# Patient Record
Sex: Female | Born: 1940 | Race: White | Hispanic: No | Smoking: Former smoker
Health system: Southern US, Community
[De-identification: ages and names within clinical notes are randomized; demographics above are authoritative.]

## PROBLEM LIST (undated history)

## (undated) DIAGNOSIS — M810 Age-related osteoporosis without current pathological fracture: Secondary | ICD-10-CM

## (undated) DIAGNOSIS — K5790 Diverticulosis of intestine, part unspecified, without perforation or abscess without bleeding: Secondary | ICD-10-CM

## (undated) DIAGNOSIS — K589 Irritable bowel syndrome without diarrhea: Secondary | ICD-10-CM

## (undated) DIAGNOSIS — I10 Essential (primary) hypertension: Secondary | ICD-10-CM

## (undated) DIAGNOSIS — H409 Unspecified glaucoma: Secondary | ICD-10-CM

## (undated) DIAGNOSIS — G43909 Migraine, unspecified, not intractable, without status migrainosus: Secondary | ICD-10-CM

## (undated) DIAGNOSIS — J449 Chronic obstructive pulmonary disease, unspecified: Secondary | ICD-10-CM

## (undated) DIAGNOSIS — M545 Low back pain, unspecified: Secondary | ICD-10-CM

## (undated) DIAGNOSIS — I700 Atherosclerosis of aorta: Secondary | ICD-10-CM

## (undated) DIAGNOSIS — C44301 Unspecified malignant neoplasm of skin of nose: Secondary | ICD-10-CM

## (undated) DIAGNOSIS — E7800 Pure hypercholesterolemia, unspecified: Secondary | ICD-10-CM

## (undated) DIAGNOSIS — N6019 Diffuse cystic mastopathy of unspecified breast: Secondary | ICD-10-CM

## (undated) DIAGNOSIS — R0600 Dyspnea, unspecified: Secondary | ICD-10-CM

## (undated) DIAGNOSIS — K219 Gastro-esophageal reflux disease without esophagitis: Secondary | ICD-10-CM

## (undated) DIAGNOSIS — N189 Chronic kidney disease, unspecified: Secondary | ICD-10-CM

## (undated) DIAGNOSIS — C801 Malignant (primary) neoplasm, unspecified: Secondary | ICD-10-CM

## (undated) DIAGNOSIS — E559 Vitamin D deficiency, unspecified: Secondary | ICD-10-CM

## (undated) DIAGNOSIS — R251 Tremor, unspecified: Secondary | ICD-10-CM

## (undated) DIAGNOSIS — L409 Psoriasis, unspecified: Secondary | ICD-10-CM

## (undated) DIAGNOSIS — R278 Other lack of coordination: Secondary | ICD-10-CM

## (undated) DIAGNOSIS — F419 Anxiety disorder, unspecified: Secondary | ICD-10-CM

## (undated) HISTORY — PX: TONSILLECTOMY: SUR1361

## (undated) HISTORY — PX: APPENDECTOMY: SHX54

## (undated) HISTORY — PX: EYE SURGERY: SHX253

## (undated) HISTORY — PX: CEREBRAL ANEURYSM REPAIR: SHX164

## (undated) HISTORY — PX: BREAST BIOPSY: SHX20

---

## 2003-08-19 HISTORY — PX: MOHS SURGERY: SUR867

## 2007-11-05 ENCOUNTER — Ambulatory Visit: Payer: Self-pay | Admitting: Internal Medicine

## 2008-02-10 ENCOUNTER — Ambulatory Visit: Payer: Self-pay | Admitting: Family Medicine

## 2008-02-16 ENCOUNTER — Ambulatory Visit: Payer: Self-pay | Admitting: Family Medicine

## 2010-01-09 ENCOUNTER — Ambulatory Visit: Payer: Self-pay | Admitting: Gastroenterology

## 2010-01-11 ENCOUNTER — Ambulatory Visit: Payer: Self-pay | Admitting: Gastroenterology

## 2010-04-09 ENCOUNTER — Ambulatory Visit: Payer: Self-pay | Admitting: Internal Medicine

## 2010-09-17 ENCOUNTER — Ambulatory Visit: Payer: Self-pay | Admitting: Ophthalmology

## 2010-10-03 ENCOUNTER — Ambulatory Visit: Payer: Self-pay | Admitting: Ophthalmology

## 2012-04-15 ENCOUNTER — Ambulatory Visit: Payer: Self-pay | Admitting: Internal Medicine

## 2012-05-13 ENCOUNTER — Inpatient Hospital Stay: Payer: Self-pay | Admitting: Internal Medicine

## 2012-05-13 LAB — CBC
MCHC: 33.8 g/dL (ref 32.0–36.0)
Platelet: 221 10*3/uL (ref 150–440)
RDW: 12.9 % (ref 11.5–14.5)
WBC: 6.9 10*3/uL (ref 3.6–11.0)

## 2012-05-13 LAB — CK TOTAL AND CKMB (NOT AT ARMC): CK, Total: 148 U/L (ref 21–215)

## 2012-05-13 LAB — BASIC METABOLIC PANEL
Osmolality: 272 (ref 275–301)
Potassium: 3.6 mmol/L (ref 3.5–5.1)
Sodium: 135 mmol/L — ABNORMAL LOW (ref 136–145)

## 2012-05-17 LAB — PLATELET COUNT: Platelet: 304 10*3/uL (ref 150–440)

## 2012-05-17 LAB — HEMOGLOBIN: HGB: 13.5 g/dL (ref 12.0–16.0)

## 2012-08-04 ENCOUNTER — Ambulatory Visit: Payer: Self-pay | Admitting: Gastroenterology

## 2012-11-04 ENCOUNTER — Ambulatory Visit: Payer: Self-pay | Admitting: Specialist

## 2012-11-04 LAB — CREATININE, SERUM
Creatinine: 1.04 mg/dL (ref 0.60–1.30)
EGFR (African American): >60
EGFR (Non-African Amer.): 54 — ABNORMAL LOW

## 2012-12-17 ENCOUNTER — Ambulatory Visit: Payer: Self-pay

## 2013-05-04 ENCOUNTER — Ambulatory Visit: Payer: Self-pay | Admitting: Specialist

## 2013-05-04 ENCOUNTER — Ambulatory Visit: Payer: Self-pay | Admitting: Otolaryngology

## 2013-05-04 LAB — CREATININE, SERUM
Creatinine: 0.96 mg/dL (ref 0.60–1.30)
EGFR (African American): >60
EGFR (Non-African Amer.): 60 — ABNORMAL LOW

## 2013-05-18 DEATH — deceased

## 2014-02-28 LAB — BASIC METABOLIC PANEL
ANION GAP: 9 (ref 7–16)
BUN: 14 mg/dL (ref 7–18)
CALCIUM: 9.3 mg/dL (ref 8.5–10.1)
Chloride: 101 mmol/L (ref 98–107)
Co2: 24 mmol/L (ref 21–32)
Creatinine: 0.89 mg/dL (ref 0.60–1.30)
EGFR (African American): >60
EGFR (Non-African Amer.): >60
Glucose: 126 mg/dL — ABNORMAL HIGH (ref 65–99)
Osmolality: 270 (ref 275–301)
Potassium: 3.5 mmol/L (ref 3.5–5.1)
SODIUM: 134 mmol/L — AB (ref 136–145)

## 2014-02-28 LAB — CBC
HCT: 40.1 % (ref 35.0–47.0)
HGB: 13.5 g/dL (ref 12.0–16.0)
MCH: 30.6 pg (ref 26.0–34.0)
MCHC: 33.7 g/dL (ref 32.0–36.0)
MCV: 91 fL (ref 80–100)
Platelet: 392 10*3/uL (ref 150–440)
RBC: 4.42 10*6/uL (ref 3.80–5.20)
RDW: 13.6 % (ref 11.5–14.5)
WBC: 9.4 10*3/uL (ref 3.6–11.0)

## 2014-02-28 LAB — TROPONIN I: Troponin-I: <0.02 ng/mL

## 2014-03-01 ENCOUNTER — Observation Stay: Payer: Self-pay | Admitting: Family Medicine

## 2014-03-02 LAB — CBC WITH DIFFERENTIAL/PLATELET
BASOS ABS: 0.1 10*3/uL (ref 0.0–0.1)
BASOS PCT: 1.2 %
EOS PCT: 3.7 %
Eosinophil #: 0.2 10*3/uL (ref 0.0–0.7)
HCT: 36 % (ref 35.0–47.0)
HGB: 12.1 g/dL (ref 12.0–16.0)
LYMPHS ABS: 2.3 10*3/uL (ref 1.0–3.6)
Lymphocyte %: 41.9 %
MCH: 30.9 pg (ref 26.0–34.0)
MCHC: 33.5 g/dL (ref 32.0–36.0)
MCV: 92 fL (ref 80–100)
MONOS PCT: 7.4 %
Monocyte #: 0.4 x10 3/mm (ref 0.2–0.9)
Neutrophil #: 2.5 10*3/uL (ref 1.4–6.5)
Neutrophil %: 45.8 %
Platelet: 290 10*3/uL (ref 150–440)
RBC: 3.9 10*6/uL (ref 3.80–5.20)
RDW: 13.8 % (ref 11.5–14.5)
WBC: 5.5 10*3/uL (ref 3.6–11.0)

## 2014-03-02 LAB — BASIC METABOLIC PANEL
ANION GAP: 4 — AB (ref 7–16)
BUN: 19 mg/dL — ABNORMAL HIGH (ref 7–18)
Calcium, Total: 8.2 mg/dL — ABNORMAL LOW (ref 8.5–10.1)
Chloride: 108 mmol/L — ABNORMAL HIGH (ref 98–107)
Co2: 27 mmol/L (ref 21–32)
Creatinine: 0.93 mg/dL (ref 0.60–1.30)
EGFR (African American): >60
EGFR (Non-African Amer.): >60
GLUCOSE: 89 mg/dL (ref 65–99)
OSMOLALITY: 279 (ref 275–301)
Potassium: 4.1 mmol/L (ref 3.5–5.1)
Sodium: 139 mmol/L (ref 136–145)

## 2014-03-24 ENCOUNTER — Ambulatory Visit: Payer: Self-pay | Admitting: Internal Medicine

## 2014-03-30 DIAGNOSIS — R519 Headache, unspecified: Secondary | ICD-10-CM | POA: Insufficient documentation

## 2014-03-30 DIAGNOSIS — R200 Anesthesia of skin: Secondary | ICD-10-CM | POA: Insufficient documentation

## 2014-03-30 DIAGNOSIS — R51 Headache: Secondary | ICD-10-CM

## 2014-04-27 ENCOUNTER — Ambulatory Visit: Payer: Self-pay | Admitting: Specialist

## 2014-06-06 DIAGNOSIS — I10 Essential (primary) hypertension: Secondary | ICD-10-CM | POA: Insufficient documentation

## 2014-09-08 DIAGNOSIS — K2100 Gastro-esophageal reflux disease with esophagitis, without bleeding: Secondary | ICD-10-CM | POA: Insufficient documentation

## 2014-09-08 DIAGNOSIS — J449 Chronic obstructive pulmonary disease, unspecified: Secondary | ICD-10-CM | POA: Insufficient documentation

## 2014-09-08 DIAGNOSIS — E559 Vitamin D deficiency, unspecified: Secondary | ICD-10-CM | POA: Insufficient documentation

## 2014-09-09 DIAGNOSIS — R7303 Prediabetes: Secondary | ICD-10-CM | POA: Insufficient documentation

## 2014-10-19 DIAGNOSIS — M545 Low back pain, unspecified: Secondary | ICD-10-CM | POA: Insufficient documentation

## 2015-03-03 ENCOUNTER — Other Ambulatory Visit: Payer: Self-pay | Admitting: Specialist

## 2015-03-03 DIAGNOSIS — R911 Solitary pulmonary nodule: Secondary | ICD-10-CM

## 2015-03-11 NOTE — Discharge Summary (Signed)
PATIENT NAME:  Jessica Bowman, Jessica Bowman MR#:  [id number] DATE OF BIRTH:  [birth year]  DATE OF ADMISSION:  03/01/2014 DATE OF DISCHARGE:  03/02/2014  REASON FOR ADMISSION: Severe headache with intractable pain.   DISPOSITION: Home.   FOLLOWUP: With Dr. Ezequiel Kayser, in 1 to 2 weeks.  REFERRAL: To neurology, Dr. Melrose Nakayama, in 2 to 4 weeks.   MEDICATIONS AT DISCHARGE: Avalide 150/12.5 mg half of a tablet once a day, Lexapro 5 mg twice daily, timolol 0.5% in affected eye, aspirin 81 mg daily, Symbicort 160/4.5 mcg inhaler 2 puffs twice daily, Xanax 0.25 mg 3 times a day as needed for anxiety, Combivent 18 mcg/103 mcg 4 times a day as needed for shortness of breath, Spiriva 18 mcg once a day, DuoNeb at home as needed for shortness of breath, and Percocet 5/325 mg 1 every 4 hours as needed for severe pain.  IMAGING: Important results: CT scan of the head: Age-related atrophy and chronic white matter ischemic changes. MRI with an MRA of the brain without contrast shows atrophy and small vessel disease. No acute intracranial findings. Due to susceptibility artifact, status of the previously clipped aneurysm is not assessed with MR. Bulbous appearing basilar tip. 2 mm broad-based saccular aneurysm not excluded. Recommend followup with neurology outpatient.   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 She had a history of migraines in the past but this is the worst she has had. She had some complaints of nausea and vomiting with sensitivity to light and at some point developed an episode of left hand numbness at the level of the lateral 3 fingers associated with weakness lasting for 15 to 20 minutes. Worse headache  so  she came to the Emergency Department. The patient was admitted and  closely followed. MRI/MRA of the brain was done without any acute findings.  As mentioned above, there is atrophy and small vessel disease. There is some susceptibility artifact due to the previous clipped aneurysm for what a bulbous appearing basilar tip measures 2 mm broad-based saccular aneurysm could not be excluded completely. Otherwise recommendation for followup with neurology neurosurgery recommended. The patient was discharged in really good condition. She had full resolution of the headaches without any significant problems.  As her neurologic symptoms resolved, her headache resolved and she was headache free during the hospitalization.   TIME SPENT: I spent about 45 minutes discharging this patient.    ____________________________ Kayenta Sink, MD rsg:lt D: 03/06/2014 03:40:00 ET T: 03/06/2014 04:59:59 ET JOB#: [id number]  cc: Thayer Sink, MD, <Dictator> Ravin Bendall America Brown MD ELECTRONICALLY SIGNED 03/14/2014 14:58

## 2015-03-11 NOTE — H&P (Signed)
PATIENT NAME:  Jessica Bowman, Jessica Bowman MR#:  [id number] DATE OF BIRTH:  [birth year]  DATE OF ADMISSION:  03/01/2014  REFERRING PHYSICIAN: Dr. Lisa Roca.   PRIMARY CARE PHYSICIAN: Dr. Ezequiel Kayser.    CHIEF COMPLAINT: Headache.   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 Presents with complaints of headache. The patient reports the headache is right-sided, started a few hours prior to presentation. Reports it represents her of migraines but reports she has not had these migraines for a few years now. As well, complains of some nausea and vomiting with it. As well, reports some sensitivity to light. As well, the patient reported at home she had a brief episode of left hand, mainly the lateral 3 fingers, numbness and weakness, lasted for around 15 minutes which currently resolved. CT head was done in the ED. Did not show any acute findings. Only showed evidence of previous right MCA territory aneurysm clipping. The patient's blood pressure was acceptable in the ED. The patient initially refused an LP to evaluate for subarachnoid hemorrhage. Her CT head did not show evidence of subarachnoid bleed. Hospitalists were requested to admit the patient for further evaluation and workup and possible need for MRI in the morning. The patient denies any fever or chills, any nausea currently. No further vomiting, nausea. No blurry vision. No visual problems. No current focal deficits or tingling or numbness. Reports her headache is much improved.   PAST MEDICAL HISTORY:  1. Hypertension.  2. COPD.  3. Anxiety.  4. Glaucoma.   PAST SURGICAL HISTORY:  1. Right ophthalmic artery aneurysm clipping.  2. Appendectomy.  3. Tonsillectomy.   ALLERGIES: PENICILLIN causing rash. LISINOPRIL causing angioedema.   HOME MEDICATIONS:  1. Lexapro 5 mg oral daily.  2. Breo Ellipta inhalational 1  twice a day.  3. DuoNebs as needed.  4. Spiriva inhalational daily.  5. Lumigan 0.01% ophthalmic solution both eyes at bedtime 1 drop.   SOCIAL HISTORY: The patient quit smoking before 2 years. No alcohol. No illicit drug use.   FAMILY HISTORY: Significant for heart disease and coronary artery disease in the family.   REVIEW OF SYSTEMS:  CONSTITUTIONAL: Denies fever, chills, fatigue, weight gain, weight loss.  EYES: Denies blurry vision, double vision, inflammation, glaucoma.  ENT: Denies tinnitus, ear pain, hearing loss, epistaxis or discharge.  RESPIRATORY: Denies cough, wheezing, shortness of breath, hemoptysis.  CARDIOVASCULAR: Denies chest pain, edema, arrhythmia, palpitation.  GASTROINTESTINAL: Reports nausea and vomiting, currently resolved. Denies any diarrhea, abdominal pain, hematemesis, melena.  GENITOURINARY: Denies dysuria, hematuria, renal colic.  ENDOCRINE: Denies polyuria, polydipsia, heat or cold intolerance.  HEMATOLOGY: Denies anemia, easy bruising, bleeding diathesis.  INTEGUMENTARY: Denies any acne, rash or skin lesion.  MUSCULOSKELETAL: Denies any swelling, gout, arthritis, cramps.  NEUROLOGIC: Denies any history of CVA, TIA, seizures in the past. Reports right-sided headache. Reports brief episode of tingling and numbness and focal weakness in the left hand, mainly in the lateral 3 fingers.  PSYCHIATRIC: Reports history of anxiety and depression. Denies schizophrenia.   PHYSICAL EXAMINATION:  VITAL SIGNS: Temperature 98.7, pulse 81, respiratory rate 18, blood pressure 131/59, saturating 96% on room air.  GENERAL: Well-nourished female, looks comfortable in bed, in no apparent distress.  HEENT: Head atraumatic, normocephalic. Pupils equal, reactive to light. Pink conjunctivae. Anicteric sclerae. Moist oral mucosa.  NECK: Supple. No thyromegaly. No JVD.  CHEST: Good air entry bilaterally. No wheezing,  rales, rhonchi.  CARDIOVASCULAR: S1, S2 heard. No rubs, murmurs  or gallops.  ABDOMEN: Soft, nontender, nondistended. Bowel sounds present.  EXTREMITIES: No edema. No clubbing. No cyanosis.  PSYCHIATRIC: Appropriate affect. Awake, alert x 3. Intact judgment and insight.  NEUROLOGIC: Cranial nerves grossly intact. Motor 5 out of 5. No focal deficits. The patient had good strength in her hands as well as her fingers. Sensation symmetrical and intact to light touch in upper and lower extremities. Reflexes +2 bilaterally.  SKIN: Normal skin turgor. Warm and dry.  LYMPHATICS: No cervical lymphadenopathy could be appreciated.  MUSCULOSKELETAL: No joint effusion or erythema.   PERTINENT LABORATORIES: Glucose 126, BUN 14, creatinine 0.89, sodium 134, potassium 3.5, chloride 101, CO2 24. Troponin less than 0.02. White blood cells 9.4, hemoglobin 3.5, hematocrit 40.1, platelets 392.   IMAGING: CT of head without contrast showing no acute findings and evidence of right ophthalmic artery clipping.   ASSESSMENT AND PLAN:  1. 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 Will keep the patient on neurology checks. Will put her on telemetry. Will consult the neurology service. The patient was not given aspirin in the Emergency Department as her symptoms unlikely appear to be cerebrovascular accident or transient ischemic attack, as well as her history of clipping and subarachnoid hemorrhage could not be ruled out as the patient refused lumbar puncture by Emergency Department physician, even though it does not seem likely at this point. As well, the patient might need MRI but given her ophthalmic aneurysm clipping, unclear if she will be cleared for MRI or not, so will have her observed with neurology checks and will consult the neurology  service to determine if any further workup is indicated at this point.  2. Hypertension: Blood pressure is acceptable. Continue with home medication.  3. Glaucoma: Continue with Lumigan.  4. Chronic obstructive pulmonary disease: The patient has no wheezing. Continue with Advair, Spiriva and nebulizer treatment.  5. Depression: Continue with Lexapro.  6. Deep vein thrombosis prophylaxis: Sequential compression device.   CODE STATUS: The patient reports she is a FULL CODE. She has a Living Will. Her husband is her healthcare power of attorney but reports he is currently sick and she reports currently her daughter is her healthcare power of attorney.   TOTAL TIME SPENT ON ADMISSION AND PATIENT CARE: 50 minutes.    ____________________________ Albertine Patricia, MD dse:gb D: 03/01/2014 01:24:20 ET T: 03/01/2014 02:22:14 ET JOB#: [id number]  cc: Albertine Patricia, MD, <Dictator> Lizett Chowning Graciela Husbands MD ELECTRONICALLY SIGNED 03/02/2014 20:39

## 2015-03-11 NOTE — Consult Note (Signed)
Referring Physician:  Albertine Patricia   Primary Care Physician:   Albertine Patricia [place], [address] [place],  [address], [place] [phone]  Reason for Consult: Admit Date: 28-Feb-2014  Chief Complaint: headache  Reason for Consult: headache   History of Present Illness: History of Present Illness:   74 yo RHD F with hx of migraines in past presents to [facility] secondary to severe headache.  Pt reports headache was 10/10 and worse than any of her previous migraines.  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  Previous to that she had hx of migraines as a teenage until her 53's.  She had some of the flashing lights this time with N/V.  New with this episodes was the fact that she had L hand numbness and tingling.  No other complaints and headache went away after sleep.  ROS:  General denies complaints    HEENT no complaints    Lungs no complaints    Cardiac no complaints    GI no complaints    GU no complaints    Musculoskeletal no complaints    Extremities no complaints    Skin no complaints    Neuro headache    Endocrine no complaints    Psych no complaints    Past Medical/Surgical Hx:  Glaucoma:   Anxiety:   HTN:   Past Medical/ Surgical Hx:  Past Medical History as above   Past Surgical History as above   Home Medications: Medication Instructions Last Modified Date/Time  acetaminophen-oxyCODONE 325 mg-5 mg oral tablet 1 tab(s) orally every 4 hours, As Needed, pain , As needed, pain 14-Apr-15 14:03  Lexapro 5 mg oral tablet 1  orally 2 times a day  30-Jan-14 17:25  Avalide 150 mg-12.5 mg oral tablet 0.5  orally once a day  30-Jan-14 17:25  timolol 0.5% ophthalmic solution 1 drop(s) to each affected eye 2 times a day 30-Jan-14 17:25  aspirin 81 mg oral tablet 1 tab(s) orally once a day 30-Jan-14 17:25  Symbicort 160 mcg-4.5 mcg/inh  inhalation aerosol 2 puff(s) inhaled 2 times a day 30-Jan-14 17:25  Xanax 0.25 mg oral tablet 1 tab(s) orally 3 times a day, As Needed- for Anxiety, Nervousness  30-Jan-14 17:25  Combivent 18 mcg-103 mcg-/inh inhalation aerosol 1 puff(s) inhaled 4 times a day, As Needed- for Shortness of Breath  30-Jan-14 17:25  Spiriva 18 mcg inhalation capsule 1 each inhaled once a day 30-Jan-14 17:25  DuoNeb 0.5 mg-2.5 mg/3 mL inhalation solution milliliter(s) inhaled every 6 hours, As Needed- for Shortness of Breath  30-Jan-14 17:25   Allergies:  Penicillin: Itching  Lisinopril: Swelling, Angioedema  Allergies:  Allergies as above    Social/Family History: Employment Status: retired  Lives With: spouse  Living Arrangements: house  Social History: no tob, no EtOH, no illicits  Family History: no seizures, + migraines   Vital Signs: **Vital Signs.:   14-Apr-15 14:55  Vital Signs Type Routine  Temperature Temperature (F) 97.8  Celsius 36.5  Temperature Source oral  Pulse Pulse 85  Respirations Respirations 18  Systolic BP Systolic BP 628  Diastolic BP (mmHg) Diastolic BP (mmHg) 315  Mean BP 112  Pulse Ox % Pulse Ox % 92  Oxygen Delivery Room Air/ 21 %   Physical Exam: General: nl weight, NAD  HEENT: normocephalic, sclera nonicteric, oropharynx clear  Neck: supple, no JVD, no bruits  Chest: CTA B, no wheezing, good movement  Cardiac: RRR, no murmurs, no edema, 2+ pulses  Extremities: no C/C/E, FROM   Neurologic Exam: Mental Status: alert and oriented x 3, normal speech and language, follows complex commands  Cranial Nerves: PERRLA, EOMI, nl VF, face symmetric, tongue midline, shoulder shrug equal  Motor Exam: 5/5 B normal, tone, no tremor  Deep Tendon Reflexes: 2+/4 B, plantars downgoing B, no Hoffman  Sensory Exam: pinprick, temperature, and vibration intact B  Coordination: FTN and HTS WNL, nl RAM, nl gait   Lab Results:  Routine Chem:  13-Apr-15 18:12   Glucose, Serum  126   BUN 14  Creatinine (comp) 0.89  Sodium, Serum  134  Potassium, Serum 3.5  Chloride, Serum 101  CO2, Serum 24  Calcium (Total), Serum 9.3  Anion Gap 9  Osmolality (calc) 270  eGFR (African American) >60  eGFR (Non-African American) >60 (eGFR values <47m/min/1.73 m2 may be an indication of chronic kidney disease (CKD). Calculated eGFR is useful in patients with stable renal function. The eGFR calculation will not be reliable in acutely ill patients when serum creatinine is changing rapidly. It is not useful in  patients on dialysis. The eGFR calculation may not be applicable to patients at the low and high extremes of body sizes, pregnant women, and vegetarians.)  Cardiac:  13-Apr-15 18:12   Troponin I < 0.02 (0.00-0.05 0.05 ng/mL or less: NEGATIVE  Repeat testing in 3-6 hrs  if clinically indicated. >0.05 ng/mL: POTENTIAL  MYOCARDIAL INJURY. Repeat  testing in 3-6 hrs if  clinically indicated. NOTE: An increase or decrease  of 30% or more on serial  testing suggests a  clinically important change)  Routine Hem:  13-Apr-15 18:12   WBC (CBC) 9.4  RBC (CBC) 4.42  Hemoglobin (CBC) 13.5  Hematocrit (CBC) 40.1  Platelet Count (CBC) 392 (Result(s) reported on 28 Feb 2014 at 08:34PM.)  MCV 91  MCH 30.6  MCHC 33.7  RDW 13.6   Radiology Results: CT:    13-Apr-15 16:49, CT Head Without Contrast  CT Head Without Contrast   REASON FOR EXAM:    numbness  COMMENTS:       PROCEDURE: CT  - CT HEAD WITHOUT CONTRAST  - Feb 28 2014  4:49PM     CLINICAL DATA:  Headaches, and paresthesias, prior right cerebral  aneurysm clipping    EXAM:  CT HEAD WITHOUT CONTRAST    TECHNIQUE:  Contiguous axial images were obtained from the base of the skull  through the vertex without contrast.  COMPARISON:  05/04/2013    FINDINGS:  Previous right MCA territory aneurysm clipping. Mild brain atrophy  and chronic white matter microvascular ischemic change diffusely. No  acute  intracranial hemorrhage, definite acute infarction, mass  lesion, midline shift, herniation, hydrocephalus, or extra-axial  fluid collection. cisterns patent. No definite cerebellar  abnormality. Mastoids and sinusesclear.     IMPRESSION:  Age-related brain atrophy and chronic white matter ischemic changes.    Previous right MCA territory aneurysm clipping  No acute process by noncontrast CT      Electronically Signed    By: TDaryll BrodM.D.    On: 02/28/2014 17:03         Verified By: MEarl Gala M.D.,   Radiology Impression: Radiology Impression: CT personally reviewed by me and normal   Impression/Recommendations: Recommendations:   previous notes personally reviewed by me reviewed by me   Headache-  most concerning is [facility] that  it is the worst headache ever but it could be a migraine variant as well L hand numbness-  this lasted only 15 minutes but could be a complicated migraine vs. TIA vs. seizure Anxiety-  makes treatment a little harder MRI of brain w/o contrast MRA of head w/o contrast avoid narcotics PRN fioricet for headachees start ASA 65m daily continue SSRI for anxiety will follow  Electronic Signatures: SJamison Neighbor(MD)  (Signed 15-Apr-15 00:38)  Authored: REFERRING PHYSICIAN, Primary Care Physician, Consult, History of Present Illness, Review of Systems, PAST MEDICAL/SURGICAL HISTORY, HOME MEDICATIONS, ALLERGIES, Social/Family History, NURSING VITAL SIGNS, Physical Exam-, LAB RESULTS, RADIOLOGY RESULTS, Recommendations   Last Updated: 15-Apr-15 00:38 by SJamison Neighbor(MD)

## 2015-03-12 NOTE — Discharge Summary (Signed)
PATIENT NAME:  Jessica Bowman, HUSK MR#:  [id number] DATE OF BIRTH:  March 12, 1941  DATE OF ADMISSION:  05/13/2012 DATE OF DISCHARGE:  05/17/2012  DIAGNOSES:  1. Acute hypoxic respiratory failure likely due to chronic obstructive pulmonary disease exacerbation.  2. Hypertension.  3. Glaucoma.  4. Depression, anxiety.   DISPOSITION: Patient is being discharged home with home health and home oxygen.   DIET: Low sodium.   ACTIVITY: As tolerated.   DISCHARGE MEDICATIONS:  1. Lexapro 5 mg b.i.d.  2. Avalide 150 mg/12.5 mg 0.5 tablet once a day.  3. Timolol 0.5% solution one drop to each affected eye b.i.d.  4. Prednisone taper as prescribed.  5. Aspirin 81 mg daily.  6. Symbicort 160/4.5, 2 puffs b.i.d.  7. Xanax 0.25 mg q.8 hours p.r.n. anxiety.  8. Tussionex 5 mL b.i.d. p.r.n. cough.  9. Robitussin-DM 10 mL q.6h. p.r.n. cough.  10. Combivent 1 puff q.i.d. p.r.n. shortness of breath.  11. Levaquin 500 mg daily for three days.  12. Spiriva 18 mcg inhaled daily.  13. DuoNebs q.6 hours p.r.n. shortness of breath. 14. Oxygen 2 liters via nasal cannula.   LABORATORY, DIAGNOSTIC, AND RADIOLOGICAL DATA: Chest x-ray showed no acute abnormalities. There was hyperinflation consistent with chronic obstructive pulmonary disease. No infiltrate. CT chest showed no PE, moderate centrilobular emphysema. There was an indeterminate 4 mm nodule in the right minor fissure, small left adrenal nodule, possible adrenal adenoma. Cardiac enzymes normal. Complete metabolic panel essentially normal. BNP 155. CBC normal.   HOSPITAL COURSE: Patient is a 74 year old female with past medical history of hypertension and glaucoma who is a long-term smoker who presented with shortness of breath. She was found to be hypoxic and was admitted with a diagnosis of acute hypoxic respiratory failure likely due to chronic obstructive pulmonary disease exacerbation because she was in some distress and wheezing on presentation. She is a  long-term smoker. The diagnosis of chronic obstructive pulmonary disease is new for her. She needs to have official PFTs done to confirm the diagnosis, however, her chest x-ray and CT chest did show moderate emphysema. She was started on oxygen therapy, incentive spirometry, nebulizer treatments, empiric antibiotics, Symbicort, p.r.n. antitussives with some improvement. Her saturations were good on room air at rest, however, she desaturated rapidly into the 80s on minimal exertion therefore home oxygen has been arranged for the patient. A CAT scan of the chest was also done which showed no PE but moderate centrilobular emphysema. There was a small lung nodule and a small adrenal nodule, possible adenoma. Patient will need to have repeat scan in 3 to 6 months as an outpatient to ensure stability. Her hypertension and glaucoma remained stable. She is on Lexapro for her depression. She was also very anxious about her new diagnosis and condition and has been started on p.r.n. Xanax. Patient said that she will try and arrange for outpatient pulmonary consultation and pulmonary rehab through her primary care physician, Dr. Raechel Ache. Patient is being discharged home in a stable condition with home health.    TIME SPENT: 45 minutes.   ____________________________ Cherre Huger, MD sp:cms D: 05/17/2012 15:00:49 ET T: 05/18/2012 10:42:07 ET JOB#: [id number]  cc: Cherre Huger, MD, <Dictator> Christena Flake. Raechel Ache, MD Cherre Huger MD ELECTRONICALLY SIGNED 05/18/2012 17:08

## 2015-03-12 NOTE — H&P (Signed)
PATIENT NAME:  Jessica Bowman, Jessica Bowman MR#:  [id number] DATE OF BIRTH:  1941-01-24  DATE OF ADMISSION:  05/13/2012  ADMITTING PHYSICIAN: Dr. Gladstone Lighter  PRIMARY CARE PHYSICIAN: Dr. Ezequiel Kayser    CHIEF COMPLAINT: Difficulty breathing.   HISTORY OF PRESENT ILLNESS: Ms. Teall is a 74 year old active Caucasian female with past medical significant for hypertension and ongoing smoking presents to the hospital with worsening shortness of breath since yesterday. Patient went to [place] last week and as she was returning from that trip she started to have some productive cough with whitish mucoid phlegm over the weekend and was also having some difficulty breathing. She was seen by her PCP on Monday and was given a prescription for Omnicef and also albuterol inhaler along with benzonatate capsules for cough. She has not used the albuterol inhaler the last couple of days and was taking the antibiotic, however, her breathing got worse and she had audible wheezing and could not sleep last night so came in here. She was hypoxic 85% on room air when she came in and is currently on 2 liters oxygen. She has diffuse wheezing on examination. So she is being admitted with chronic obstructive pulmonary disease exacerbation. Patient smokes about 1 pack per day. Does not have an official diagnosis of chronic obstructive pulmonary disease but chest x-ray does show some chronic fibrotic changes and also chronic obstructive pulmonary disease changes.   PAST MEDICAL HISTORY:  1. Hypertension.  2. Anxiety.  3. Osteopenia.  4. Glaucoma.  5. Ongoing smoking.   PAST SURGICAL HISTORY:  1. 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  2. Appendectomy.  3. Tonsillectomy.   ALLERGIES TO MEDICATIONS: Penicillin causes rash. She has reaction to epinephrine and also Lisinopril causes angioedema.   MEDICATIONS AT HOME:  1. Avalide (irbesartan/HCTZ) 150 mg/12.5 mg half tablet p.o. daily.  2. Lexapro 5 mg p.o.  b.i.d.  3. Timolol 0.5% ophthalmic solution 1 drop each eye b.i.d.  MEDICATIONS: She was also started on the following medications just two days ago.  1. Benzonatate 100 mg p.o. t.i.d. p.r.n. for cough.  2. Cefdinir 300 mg p.o. b.i.d.  3. Ventolin inhaler 2 puffs q.4 hours p.r.n. for wheezing.   SOCIAL HISTORY: Lives at home with her husband. Smokes about 1 pack per day but has not smoked any since two days. Very occasional alcohol use. Patient worked as a Radiation protection practitioner.   FAMILY HISTORY: Significant for heart disease in the family. Father died in his 25s with coronary artery disease, sister diagnosed in 55s with heart disease and paternal grandmother also with heart disease. Mom had dementia and also had chronic obstructive pulmonary disease.    REVIEW OF SYSTEMS: CONSTITUTIONAL: No fever, fatigue, or weakness. EYES: Uses reading glasses and cataracts present and also glaucoma present. No acute changes in vision. ENT: No tinnitus, ear pain, hearing loss, epistaxis or discharge. RESPIRATORY: Positive for cough, chronic obstructive pulmonary disease. No hemoptysis. CARDIOVASCULAR: No chest pain, orthopnea, edema, arrhythmia, palpitations or syncope. GASTROINTESTINAL: No nausea, vomiting, diarrhea, abdominal pain, hematemesis, or melena. GENITOURINARY: No dysuria, hematuria, renal calculus, frequency, or incontinence. ENDOCRINE: No polyuria, nocturia, thyroid problems, heat or cold intolerance. HEMATOLOGY: No anemia, easy bruising or bleeding. SKIN: No acne, rash, or lesions. MUSCULOSKELETAL: No neck, back, shoulder pain, arthritis, or gout. Positive for osteopenia. NEUROLOGICAL: No numbness, weakness, cerebrovascular accident, transient ischemic attack, or seizures. PSYCHOLOGICAL: No anxiety, insomnia, depression.   PHYSICAL EXAMINATION:  VITAL SIGNS: Temperature 99.4 degrees Fahrenheit, pulse 96, respirations 18, blood pressure  149/91, pulse oximetry was 96% on 4 liters.   GENERAL: Well built, well  nourished female sitting in bed, not in any acute distress.   HEENT: Normocephalic, atraumatic. Pupils equal, round, reacting to light. Anicteric sclerae. Extraocular movements intact. Oropharynx clear without erythema, mass or exudates.   NECK: Supple. No thyromegaly, JVD, or carotid bruits. No lymphadenopathy.   LUNGS: Diffuse scattered wheeze bilaterally especially more prominent on the left side. No use of accessory muscles for breathing. No crackles.   CARDIOVASCULAR: S1, S2 regular rate and rhythm. No murmurs, rubs, or gallops.   ABDOMEN: Soft, nontender, nondistended. No hepatosplenomegaly. Normal bowel sounds.   EXTREMITIES: No pedal edema. No clubbing or cyanosis. 2+ dorsalis pedis pulses palpable bilaterally.   SKIN: No acne, rash, or lesions.   LYMPH: No cervical lymphadenopathy.   NEUROLOGIC: Cranial nerves intact. No focal motor or sensory deficits.   PSYCHOLOGICAL: Patient is awake, alert, oriented x3.   LABORATORY, RADIOLOGICAL AND DIAGNOSTIC DATA: WBC 6.9, hemoglobin 13.2, hematocrit 39.2, platelet count 221.   Sodium 133, potassium 3.6, chloride 102, bicarbonate 22, BUN 15, creatinine 0.67, glucose 115, calcium 8.6, BNP slightly elevated at 155. CK 148, CK-MB 1.2. Troponin less than 0.02. Chest x-ray showing hyperinflation changes consistent with chronic obstructive pulmonary disease and also changes suggestive of fibrosis. Minimal left lung base atelectasis. Early infiltrate cannot be excluded.   EKG showing normal sinus rhythm, heart rate of 93.   ASSESSMENT AND PLAN: This is a 74 year old female with history of hypertension, ongoing smoking admitted for acute respiratory failure secondary to chronic obstructive pulmonary disease exacerbation.  1. Acute hypoxic respiratory failure secondary to chronic obstructive pulmonary disease exacerbation. Continue oxygen support, IV Solu-Medrol, DuoNebs and also start her on Symbicort. Advised smoking cessation. Wean off oxygen  as tolerated and check ambulatory sats in a.m.  Also starting on Levaquin since she was placed on outpatient antibiotic and also chest x-ray showing some left lower lobe changes.  2. Hypertension. Continue irbesartan for now.  3. Glaucoma. Continue timolol drops. 4. Depression/anxiety. Patient is on Lexapro.  5. GI and deep vein thrombosis prophylaxis. On Protonix and also Lovenox.  6. Tobacco use disorder. Counseled for three minutes and placed on nicotine patch.  7. CODE STATUS: FULL CODE.   TIME SPENT ON ADMISSION: 50 minutes.   ____________________________ Gladstone Lighter, MD rk:cms D: 05/13/2012 12:05:07 ET T: 05/13/2012 13:01:25 ET JOB#: [id number]  cc: Gladstone Lighter, MD, <Dictator> Christena Flake. Raechel Ache, MD  Gladstone Lighter MD ELECTRONICALLY SIGNED 05/13/2012 15:24

## 2015-05-01 ENCOUNTER — Other Ambulatory Visit: Payer: Self-pay

## 2015-05-02 ENCOUNTER — Ambulatory Visit
Admission: RE | Admit: 2015-05-02 | Discharge: 2015-05-02 | Disposition: A | Discharge status: Home / Self Care | Payer: Medicare Other | Source: Ambulatory Visit | Attending: Specialist | Admitting: Specialist

## 2015-05-02 DIAGNOSIS — Z87891 Personal history of nicotine dependence: Secondary | ICD-10-CM | POA: Diagnosis not present

## 2015-05-02 DIAGNOSIS — R911 Solitary pulmonary nodule: Secondary | ICD-10-CM

## 2015-05-02 HISTORY — DX: Essential (primary) hypertension: I10

## 2015-05-02 HISTORY — DX: Chronic obstructive pulmonary disease, unspecified: J44.9

## 2015-05-02 HISTORY — DX: Age-related osteoporosis without current pathological fracture: M81.0

## 2015-05-02 HISTORY — DX: Unspecified glaucoma: H40.9

## 2016-01-09 ENCOUNTER — Encounter: Payer: Medicare Other | Attending: Specialist | Admitting: Respiratory Therapy

## 2016-01-09 VITALS — Ht 61.0 in | Wt 115.8 lb

## 2016-01-09 DIAGNOSIS — E7800 Pure hypercholesterolemia, unspecified: Secondary | ICD-10-CM | POA: Insufficient documentation

## 2016-01-09 DIAGNOSIS — F419 Anxiety disorder, unspecified: Secondary | ICD-10-CM | POA: Insufficient documentation

## 2016-01-09 DIAGNOSIS — J449 Chronic obstructive pulmonary disease, unspecified: Secondary | ICD-10-CM | POA: Diagnosis present

## 2016-01-09 DIAGNOSIS — H409 Unspecified glaucoma: Secondary | ICD-10-CM | POA: Diagnosis not present

## 2016-01-09 DIAGNOSIS — N6019 Diffuse cystic mastopathy of unspecified breast: Secondary | ICD-10-CM | POA: Insufficient documentation

## 2016-01-09 DIAGNOSIS — K5790 Diverticulosis of intestine, part unspecified, without perforation or abscess without bleeding: Secondary | ICD-10-CM | POA: Insufficient documentation

## 2016-01-09 DIAGNOSIS — M810 Age-related osteoporosis without current pathological fracture: Secondary | ICD-10-CM | POA: Diagnosis not present

## 2016-01-09 DIAGNOSIS — I10 Essential (primary) hypertension: Secondary | ICD-10-CM | POA: Diagnosis not present

## 2016-01-09 DIAGNOSIS — K589 Irritable bowel syndrome without diarrhea: Secondary | ICD-10-CM | POA: Insufficient documentation

## 2016-01-09 NOTE — Patient Instructions (Signed)
Patient Instructions  Patient Details  Name: GWENDYLAN PROCHNOW MRN: [id number] Date of Birth: Apr 05, 1941 Referring Provider:  Erby Pian, MD  Below are the personal goals you chose as well as exercise and nutrition goals. Our goal is to help you keep on track towards obtaining and maintaining your goals. We will be discussing your progress on these goals with you throughout the program.  Initial Exercise Prescription:     Initial Exercise Prescription - 01/09/16 1300    Date of Initial Exercise Prescription   Date 01/09/16   Treadmill   MPH 2   Grade 0   Minutes 10   Recumbant Bike   Level 2   RPM 40   Watts 20   Minutes 10   NuStep   Level 2   Watts 40   Minutes 10   Arm Ergometer   Level 1   Watts 10   Minutes 10   Recumbant Elliptical   Level 1   RPM 40   Watts 20   Minutes 10   REL-XR   Level 2   Watts 40   Minutes 10   T5 Nustep   Level 1   Watts 15   Minutes 10   Biostep-RELP   Level 2   Watts 40   Minutes 10   Prescription Details   Frequency (times per week) 3   Duration Progress to 30 minutes of continuous aerobic without signs/symptoms of physical distress   Intensity   THRR REST +  30   Ratings of Perceived Exertion 11-15   Perceived Dyspnea 2-4   Progression Continue progressive overload as per policy without signs/symptoms or physical distress.   Resistance Training   Training Prescription Yes   Weight 2   Reps 10-15      Exercise Goals: Frequency: Be able to perform aerobic exercise three times per week working toward 3-5 days per week.  Intensity: Work with a perceived exertion of 11 (fairly light) - 15 (hard) as tolerated. Follow your new exercise prescription and watch for changes in prescription as you progress with the program. Changes will be reviewed with you when they are made.  Duration: You should be able to do 30 minutes of continuous aerobic exercise in addition to a 5 minute warm-up and a 5 minute cool-down  routine.  Nutrition Goals: Your personal nutrition goals will be established when you do your nutrition analysis with the dietician.  The following are nutrition guidelines to follow: Cholesterol < 200mg /day Sodium < 1500mg /day Fiber: Women over 50 yrs - 21 grams per day  Personal Goals:     Personal Goals and Risk Factors at Admission - 01/09/16 1013    Personal Goals and Risk Factors on Admission   Increase Aerobic Exercise and Physical Activity Yes   Intervention While in program, learn and follow the exercise prescription taught. Start at a low level workload and increase workload after able to maintain previous level for 30 minutes. Increase time before increasing intensity.  Ms Jambor wants to increase her exercise capacity. She currently exercises at [employer] and does water bicycling twice a week. She hasa history of swimming and does miss the sport.   Understand more about Heart/Pulmonary Disease. Yes   Intervention While in program utilize professionals for any questions, and attend the education sessions. Great websites to use are www.americanheart.org or www.lung.org for reliable information.  Ms Munsch was diagnosed with COPD in 2014 and is very interested in learning more about COPD and  management of the disease.   Improve shortness of breath with ADL's Yes   Intervention While in program, learn and follow the exercise prescription taught. Start at a low level workload and increase workload ad advised by the exercise physiologist. Increase time before increasing intensity.  Ms Cohenour does have shortness of breath and some fear of it. She is not on oxygen and does monitor her O2Sats with a home oximeter.   Develop more efficient breathing techniques such as purse lipped breathing and diaphragmatic breathing; and practicing self-pacing with activity Yes   Intervention --  Ms Sietsema does use PLB and states it is very helpful with shortness of  breath.   Increase knowledge  of respiratory medications and ability to use respiratory devices properly.  Yes   Intervention While in program, learn to administer MDI, nebulizer, and spacer properly.;Learn to take respiratory medicine as ordered.;While in program, learn to Clean MDI, nebulizers, and spacers properly.  Ms Rothrock uses Breo, Albuterol, and a nebulizer with Duoneb. She does not have a spacer. and plan to give her one when our supply arrives.   Hypertension Yes   Goal Participant will see blood pressure controlled within the values of 140/48mm/Hg or within value directed by their physician.   Intervention Provide nutrition & aerobic exercise along with prescribed medications to achieve BP 140/90 or less.      Tobacco Use Initial Evaluation: History  Smoking status  . Not on file  Smokeless tobacco  . Not on file    Copy of goals given to participant.

## 2016-01-09 NOTE — Progress Notes (Signed)
Pulmonary Individual Treatment Plan  Patient Details  Name: Jessica Bowman MRN: [id number] Date of Birth: 04/21/1941 Referring Provider:  Erby Pian, MD  Initial Encounter Date: Date: 01/09/16  Visit Diagnosis: Moderate COPD (chronic obstructive pulmonary disease) ([place])  Patient's Home Medications on Admission:  Current outpatient prescriptions:  .  albuterol (PROAIR HFA) 108 (90 Base) MCG/ACT inhaler, Inhale into the lungs., Disp: , Rfl:  .  cyclobenzaprine (FLEXERIL) 5 MG tablet, Take by mouth., Disp: , Rfl:  .  escitalopram (LEXAPRO) 10 MG tablet, , Disp: , Rfl:  .  fluticasone furoate-vilanterol (BREO ELLIPTA) 100-25 MCG/INH AEPB, Inhale into the lungs., Disp: , Rfl:  .  ipratropium-albuterol (DUONEB) 0.5-2.5 (3) MG/3ML SOLN, , Disp: , Rfl:  .  irbesartan-hydrochlorothiazide (AVALIDE) 150-12.5 MG tablet, Take by mouth., Disp: , Rfl:  .  Melatonin 5 MG TABS, Take 1 each by mouth at bedtime as needed., Disp: , Rfl:  .  aspirin EC 81 MG tablet, Take by mouth., Disp: , Rfl:  .  Cholecalciferol (VITAMIN D3) 1000 units CAPS, Take by mouth., Disp: , Rfl:  .  IRON PO, Take by mouth., Disp: , Rfl:  .  loratadine (CLARITIN) 10 MG tablet, Take by mouth., Disp: , Rfl:  .  Probiotic Product ([place]), Take by mouth., Disp: , Rfl:   Past Medical History: Past Medical History  Diagnosis Date  . Hypertension   . Glaucoma   . COPD (chronic obstructive pulmonary disease)   . Osteoporosis     Tobacco Use: History  Smoking status  . Not on file  Smokeless tobacco  . Not on file    Labs: Recent Review Flowsheet Data    There is no flowsheet data to display.       ADL UCSD:     ADL UCSD      01/09/16 0900       ADL UCSD   ADL Phase Entry     SOB Score total 50     Rest 0     Walk 3     Stairs 3     Bath 1     Dress 1     Shop 4         Pulmonary Function Assessment:     Pulmonary Function Assessment - 01/09/16 0900    Initial Spirometry  Results   FVC% 81 %   FEV1% 59 %   FEV1/FVC Ratio 56.77   Post Bronchodilator Spirometry Results   FVC% 84 %   FEV1% 67 %   FEV1/FVC Ratio 59.69   Breath   Bilateral Breath Sounds Decreased;Clear   Shortness of Breath Yes;Fear of Shortness of Breath;Limiting activity      Exercise Target Goals: Date: 01/09/16  Exercise Program Goal: Individual exercise prescription set with THRR, safety & activity barriers. Participant demonstrates ability to understand and report RPE using BORG scale, to self-measure pulse accurately, and to acknowledge the importance of the exercise prescription.  Exercise Prescription Goal: Starting with aerobic activity 30 plus minutes a day, 3 days per week for initial exercise prescription. Provide home exercise prescription and guidelines that participant acknowledges understanding prior to discharge.  Activity Barriers & Risk Stratification:     Activity Barriers & Cardiac Risk Stratification - 01/09/16 0900    Activity Barriers & Cardiac Risk Stratification   Activity Barriers Shortness of Breath   Cardiac Risk Stratification Low      6 Minute Walk:     6 Minute Walk  01/09/16 1306       6 Minute Walk   Phase Initial     Distance 1155 feet     Walk Time 6 minutes     # of Rest Breaks 0     RPE 13     Perceived Dyspnea  3     Symptoms No     Resting HR 102 bpm     Resting BP 118/74 mmHg     Max Ex. HR 120 bpm     Max Ex. BP 138/74 mmHg        Initial Exercise Prescription:     Initial Exercise Prescription - 01/09/16 1300    Date of Initial Exercise Prescription   Date 01/09/16   Treadmill   MPH 2   Grade 0   Minutes 10   Recumbant Bike   Level 2   RPM 40   Watts 20   Minutes 10   NuStep   Level 2   Watts 40   Minutes 10   Arm Ergometer   Level 1   Watts 10   Minutes 10   Recumbant Elliptical   Level 1   RPM 40   Watts 20   Minutes 10   REL-XR   Level 2   Watts 40   Minutes 10   T5 Nustep   Level 1    Watts 15   Minutes 10   Biostep-RELP   Level 2   Watts 40   Minutes 10   Prescription Details   Frequency (times per week) 3   Duration Progress to 30 minutes of continuous aerobic without signs/symptoms of physical distress   Intensity   THRR REST +  30   Ratings of Perceived Exertion 11-15   Perceived Dyspnea 2-4   Progression Continue progressive overload as per policy without signs/symptoms or physical distress.   Resistance Training   Training Prescription Yes   Weight 2   Reps 10-15      Exercise Prescription Changes:   Discharge Exercise Prescription (Final Exercise Prescription Changes):    Nutrition:  Target Goals: Understanding of nutrition guidelines, daily intake of sodium 1500mg , cholesterol 200mg , calories 30% from fat and 7% or less from saturated fats, daily to have 5 or more servings of fruits and vegetables.  Biometrics:    Nutrition Therapy Plan and Nutrition Goals:     Nutrition Therapy & Goals - 01/09/16 0900    Nutrition Therapy   Diet Jessica Bowman does not want to meet with the dietitian. She does her own cooking. She has maintained a weight of 110 to 115lbs her whole life. Jessica Bowman does drink over 8 glasses of fluid, mainly water.      Nutrition Discharge: Rate Your Plate Scores:   Psychosocial: Target Goals: Acknowledge presence or absence of depression, maximize coping skills, provide positive support system. Participant is able to verbalize types and ability to use techniques and skills needed for reducing stress and depression.  Initial Review & Psychosocial Screening:     Initial Psych Review & Screening - 01/09/16 0900    Initial Review   Current issues with Current Depression   Family Dynamics   Good Support System? Yes   Comments Jessica Bowman is dealing with the lose of her husband in 12/16. She does have good support from her 2 daughters that live close to her. She is learning about COPD and is coping very well with the  disease.   Screening Interventions   Interventions  Encouraged to exercise;Program counselor consult      Quality of Life Scores:     Quality of Life - 01/09/16 0900    Quality of Life Scores   Health/Function Pre 19.6 %   Socioeconomic Pre 27 %   Psych/Spiritual Pre 22.29 %   Family Pre 19.5 %   GLOBAL Pre 21.56 %      PHQ-9:     Recent Review Flowsheet Data    Depression screen [facility] 2/9 01/09/2016   Decreased Interest 2   Down, Depressed, Hopeless 1   PHQ - 2 Score 3   Altered sleeping 2   Tired, decreased energy 2   Change in appetite 1   Feeling bad or failure about yourself  0   Trouble concentrating 0   Moving slowly or fidgety/restless 0   Suicidal thoughts 0   PHQ-9 Score 8   Difficult doing work/chores Not difficult at all      Psychosocial Evaluation and Intervention:   Psychosocial Re-Evaluation:  Education: Education Goals: Education classes will be provided on a weekly basis, covering required topics. Participant will state understanding/return demonstration of topics presented.  Learning Barriers/Preferences:     Learning Barriers/Preferences - 01/09/16 0900    Learning Barriers/Preferences   Learning Barriers None   Learning Preferences Group Instruction;Individual Instruction;Pictoral;Skilled Demonstration;Verbal Instruction;Video;Written Material      Education Topics: Initial Evaluation Education: - Verbal, written and demonstration of respiratory meds, RPE/PD scales, oximetry and breathing techniques. Instruction on use of nebulizers and MDIs: cleaning and proper use, rinsing mouth with steroid doses and importance of monitoring MDI activations.          Pulmonary Rehab from 01/09/2016 in [facility] Cardiac and Pulmonary Rehab   Date  01/09/16   Educator  LB   Instruction Review Code  2- meets goals/outcomes      General Nutrition Guidelines/Fats and Fiber: -Group instruction provided by verbal, written material, models and posters to  present the general guidelines for heart healthy nutrition. Gives an explanation and review of dietary fats and fiber.   Controlling Sodium/Reading Food Labels: -Group verbal and written material supporting the discussion of sodium use in heart healthy nutrition. Review and explanation with models, verbal and written materials for utilization of the food label.   Exercise Physiology & Risk Factors: - Group verbal and written instruction with models to review the exercise physiology of the cardiovascular system and associated critical values. Details cardiovascular disease risk factors and the goals associated with each risk factor.   Aerobic Exercise & Resistance Training: - Gives group verbal and written discussion on the health impact of inactivity. On the components of aerobic and resistive training programs and the benefits of this training and how to safely progress through these programs.   Flexibility, Balance, General Exercise Guidelines: - Provides group verbal and written instruction on the benefits of flexibility and balance training programs. Provides general exercise guidelines with specific guidelines to those with heart or lung disease. Demonstration and skill practice provided.   Stress Management: - Provides group verbal and written instruction about the health risks of elevated stress, cause of high stress, and healthy ways to reduce stress.   Depression: - Provides group verbal and written instruction on the correlation between heart/lung disease and depressed mood, treatment options, and the stigmas associated with seeking treatment.   Exercise & Equipment Safety: - Individual verbal instruction and demonstration of equipment use and safety with use of the equipment.   Infection Prevention: - Provides verbal and  written material to individual with discussion of infection control including proper hand washing and proper equipment cleaning during exercise  session.   Falls Prevention: - Provides verbal and written material to individual with discussion of falls prevention and safety.      Pulmonary Rehab from 01/09/2016 in [facility] Cardiac and Pulmonary Rehab   Date  01/09/16   Educator  LB   Instruction Review Code  2- meets goals/outcomes      Diabetes: - Individual verbal and written instruction to review signs/symptoms of diabetes, desired ranges of glucose level fasting, after meals and with exercise. Advice that pre and post exercise glucose checks will be done for 3 sessions at entry of program.   Chronic Lung Diseases: - Group verbal and written instruction to review new updates, new respiratory medications, new advancements in procedures and treatments. Provide informative websites and "800" numbers of self-education.   Lung Procedures: - Group verbal and written instruction to describe testing methods done to diagnose lung disease. Review the outcome of test results. Describe the treatment choices: Pulmonary Function Tests, ABGs and oximetry.   Energy Conservation: - Provide group verbal and written instruction for methods to conserve energy, plan and organize activities. Instruct on pacing techniques, use of adaptive equipment and posture/positioning to relieve shortness of breath.   Triggers: - Group verbal and written instruction to review types of environmental controls: home humidity, furnaces, filters, dust mite/pet prevention, HEPA vacuums. To discuss weather changes, air quality and the benefits of nasal washing.   Exacerbations: - Group verbal and written instruction to provide: warning signs, infection symptoms, calling MD promptly, preventive modes, and value of vaccinations. Review: effective airway clearance, coughing and/or vibration techniques. Create an Sports administrator.   Oxygen: - Individual and group verbal and written instruction on oxygen therapy. Includes supplement oxygen, available portable oxygen systems,  continuous and intermittent flow rates, oxygen safety, concentrators, and Medicare reimbursement for oxygen.   Respiratory Medications: - Group verbal and written instruction to review medications for lung disease. Drug class, frequency, complications, importance of spacers, rinsing mouth after steroid MDI's, and proper cleaning methods for nebulizers.      Pulmonary Rehab from 01/09/2016 in [facility] Cardiac and Pulmonary Rehab   Date  01/09/16   Educator  LB   Instruction Review Code  2- meets goals/outcomes      AED/CPR: - Group verbal and written instruction with the use of models to demonstrate the basic use of the AED with the basic ABC's of resuscitation.   Breathing Retraining: - Provides individuals verbal and written instruction on purpose, frequency, and proper technique of diaphragmatic breathing and pursed-lipped breathing. Applies individual practice skills.      Pulmonary Rehab from 01/09/2016 in [facility] Cardiac and Pulmonary Rehab   Date  01/09/16   Educator  LB   Instruction Review Code  2- meets goals/outcomes      Anatomy and Physiology of the Lungs: - Group verbal and written instruction with the use of models to provide basic lung anatomy and physiology related to function, structure and complications of lung disease.   Heart Failure: - Group verbal and written instruction on the basics of heart failure: signs/symptoms, treatments, explanation of ejection fraction, enlarged heart and cardiomyopathy.   Sleep Apnea: - Individual verbal and written instruction to review Obstructive Sleep Apnea. Review of risk factors, methods for diagnosing and types of masks and machines for OSA.   Anxiety: - Provides group, verbal and written instruction on the correlation between heart/lung disease and  anxiety, treatment options, and management of anxiety.   Relaxation: - Provides group, verbal and written instruction about the benefits of relaxation for patients with heart/lung  disease. Also provides patients with examples of relaxation techniques.   Knowledge Questionnaire Score:     Knowledge Questionnaire Score - 01/09/16 0900    Knowledge Questionnaire Score   Pre Score 10/10      Personal Goals and Risk Factors at Admission:     Personal Goals and Risk Factors at Admission - 01/09/16 1013    Personal Goals and Risk Factors on Admission   Increase Aerobic Exercise and Physical Activity Yes   Intervention While in program, learn and follow the exercise prescription taught. Start at a low level workload and increase workload after able to maintain previous level for 30 minutes. Increase time before increasing intensity.  Jessica Pasche wants to increase her exercise capacity. She currently exercises at [employer] and does water bicycling twice a week. She hasa history of swimming and does miss the sport.   Understand more about Heart/Pulmonary Disease. Yes   Intervention While in program utilize professionals for any questions, and attend the education sessions. Great websites to use are www.americanheart.org or www.lung.org for reliable information.  Jessica Burrows was diagnosed with COPD in 2014 and is very interested in learning more about COPD and management of the disease.   Improve shortness of breath with ADL's Yes   Intervention While in program, learn and follow the exercise prescription taught. Start at a low level workload and increase workload ad advised by the exercise physiologist. Increase time before increasing intensity.  Jessica Delorbe does have shortness of breath and some fear of it. She is not on oxygen and does monitor her O2Sats with a home oximeter.   Develop more efficient breathing techniques such as purse lipped breathing and diaphragmatic breathing; and practicing self-pacing with activity Yes   Intervention --  Jessica Ouk does use PLB and states it is very helpful with shortness of  breath.   Increase knowledge of respiratory medications and  ability to use respiratory devices properly.  Yes   Intervention While in program, learn to administer MDI, nebulizer, and spacer properly.;Learn to take respiratory medicine as ordered.;While in program, learn to Clean MDI, nebulizers, and spacers properly.  Jessica Somer uses Breo, Albuterol, and a nebulizer with Duoneb. She does not have a spacer. and plan to give her one when our supply arrives.   Hypertension Yes   Goal Participant will see blood pressure controlled within the values of 140/98mm/Hg or within value directed by their physician.   Intervention Provide nutrition & aerobic exercise along with prescribed medications to achieve BP 140/90 or less.      Personal Goals and Risk Factors Review:    Personal Goals Discharge (Final Personal Goals and Risk Factors Review):    ITP Comments:   Comments:

## 2016-01-15 ENCOUNTER — Telehealth: Payer: Self-pay

## 2016-01-15 ENCOUNTER — Encounter: Payer: Medicare Other | Attending: Specialist

## 2016-01-15 DIAGNOSIS — J449 Chronic obstructive pulmonary disease, unspecified: Secondary | ICD-10-CM | POA: Insufficient documentation

## 2016-01-15 DIAGNOSIS — I10 Essential (primary) hypertension: Secondary | ICD-10-CM | POA: Insufficient documentation

## 2016-01-15 DIAGNOSIS — H409 Unspecified glaucoma: Secondary | ICD-10-CM | POA: Insufficient documentation

## 2016-01-15 DIAGNOSIS — M810 Age-related osteoporosis without current pathological fracture: Secondary | ICD-10-CM | POA: Insufficient documentation

## 2016-01-15 NOTE — Telephone Encounter (Signed)
Jessica Bowman called and stated that she has a cold, and will return to class as soon as it passes.

## 2016-01-19 ENCOUNTER — Encounter: Payer: Medicare Other | Attending: Specialist

## 2016-01-19 DIAGNOSIS — H409 Unspecified glaucoma: Secondary | ICD-10-CM | POA: Insufficient documentation

## 2016-01-19 DIAGNOSIS — M810 Age-related osteoporosis without current pathological fracture: Secondary | ICD-10-CM | POA: Insufficient documentation

## 2016-01-19 DIAGNOSIS — J449 Chronic obstructive pulmonary disease, unspecified: Secondary | ICD-10-CM | POA: Insufficient documentation

## 2016-01-19 DIAGNOSIS — I10 Essential (primary) hypertension: Secondary | ICD-10-CM | POA: Insufficient documentation

## 2016-01-24 ENCOUNTER — Encounter: Payer: Medicare Other | Admitting: *Deleted

## 2016-01-24 DIAGNOSIS — M810 Age-related osteoporosis without current pathological fracture: Secondary | ICD-10-CM | POA: Diagnosis not present

## 2016-01-24 DIAGNOSIS — H409 Unspecified glaucoma: Secondary | ICD-10-CM | POA: Diagnosis not present

## 2016-01-24 DIAGNOSIS — J449 Chronic obstructive pulmonary disease, unspecified: Secondary | ICD-10-CM | POA: Diagnosis present

## 2016-01-24 DIAGNOSIS — I10 Essential (primary) hypertension: Secondary | ICD-10-CM | POA: Diagnosis not present

## 2016-01-24 NOTE — Progress Notes (Signed)
Daily Session Note  Patient Details  Name: [name] MRN: [id number] Date of Birth: 08-18-1941 Referring Provider:  Ezequiel Kayser, MD  Encounter Date: 01/24/2016  Check In:     Session Check In - 01/24/16 1154    Check-In   Location [facility]-Cardiac & Pulmonary Rehab   Staff Present Carson Myrtle, BS, RRT, Respiratory Therapist;Devion Chriscoe, RN, Drusilla Kanner, MS, ACSM CEP, Exercise Physiologist   Supervising physician immediately available to respond to emergencies [facility] immediately available ER MD   Physician(s) Dr. Edd Fabian and Dr. Cinda Quest   Medication changes reported     No   Fall or balance concerns reported    No   Warm-up and Cool-down Performed on first and last piece of equipment   Resistance Training Performed Yes   VAD Patient? No         Goals Met:  Proper associated with RPD/PD & O2 Sat Exercise tolerated well  Goals Unmet:  Not Applicable  Comments:    Dr. Emily Filbert is Medical Director for [place] and [facility].

## 2016-02-05 ENCOUNTER — Encounter: Payer: Self-pay | Admitting: *Deleted

## 2016-02-05 DIAGNOSIS — J449 Chronic obstructive pulmonary disease, unspecified: Secondary | ICD-10-CM

## 2016-02-05 NOTE — Progress Notes (Signed)
Pulmonary Individual Treatment Plan  Patient Details  Name: Jessica Bowman MRN: [id number] Date of Birth: Mar 07, 1941 Referring Provider:  Wallene Bowman, M.D.   Initial Encounter Date: 01/09/2016      Pulmonary Rehab from 01/09/2016 in [facility] Cardiac and Pulmonary Rehab   Date  01/09/16      Visit Diagnosis: Moderate COPD (chronic obstructive pulmonary disease) ([place])  Patient's Home Medications on Admission:  Current outpatient prescriptions:  .  albuterol (PROAIR HFA) 108 (90 Base) MCG/ACT inhaler, Inhale into the lungs., Disp: , Rfl:  .  aspirin EC 81 MG tablet, Take by mouth., Disp: , Rfl:  .  Cholecalciferol (VITAMIN D3) 1000 units CAPS, Take by mouth., Disp: , Rfl:  .  cyclobenzaprine (FLEXERIL) 5 MG tablet, Take by mouth., Disp: , Rfl:  .  escitalopram (LEXAPRO) 10 MG tablet, , Disp: , Rfl:  .  fluticasone furoate-vilanterol (BREO ELLIPTA) 100-25 MCG/INH AEPB, Inhale into the lungs., Disp: , Rfl:  .  ipratropium-albuterol (DUONEB) 0.5-2.5 (3) MG/3ML SOLN, , Disp: , Rfl:  .  irbesartan-hydrochlorothiazide (AVALIDE) 150-12.5 MG tablet, Take by mouth., Disp: , Rfl:  .  IRON PO, Take by mouth., Disp: , Rfl:  .  loratadine (CLARITIN) 10 MG tablet, Take by mouth., Disp: , Rfl:  .  Melatonin 5 MG TABS, Take 1 each by mouth at bedtime as needed., Disp: , Rfl:  .  Probiotic Product ([place]), Take by mouth., Disp: , Rfl:   Past Medical History: Past Medical History  Diagnosis Date  . Hypertension   . Glaucoma   . COPD (chronic obstructive pulmonary disease)   . Osteoporosis     Tobacco Use: History  Smoking status  . Not on file  Smokeless tobacco  . Not on file    Labs: Recent Review Flowsheet Data    There is no flowsheet data to display.       ADL UCSD:     ADL UCSD      01/09/16 0900       ADL UCSD   ADL Phase Entry     SOB Score total 50     Rest 0     Walk 3     Stairs 3     Bath 1     Dress 1     Shop 4        Pulmonary Function  Assessment:     Pulmonary Function Assessment - 01/09/16 0900    Initial Spirometry Results   FVC% 81 %   FEV1% 59 %   FEV1/FVC Ratio 56.77   Post Bronchodilator Spirometry Results   FVC% 84 %   FEV1% 67 %   FEV1/FVC Ratio 59.69   Breath   Bilateral Breath Sounds Decreased;Clear   Shortness of Breath Yes;Fear of Shortness of Breath;Limiting activity      Exercise Target Goals:    Exercise Program Goal: Individual exercise prescription set with THRR, safety & activity barriers. Participant demonstrates ability to understand and report RPE using BORG scale, to self-measure pulse accurately, and to acknowledge the importance of the exercise prescription.  Exercise Prescription Goal: Starting with aerobic activity 30 plus minutes a day, 3 days per week for initial exercise prescription. Provide home exercise prescription and guidelines that participant acknowledges understanding prior to discharge.  Activity Barriers & Risk Stratification:     Activity Barriers & Cardiac Risk Stratification - 01/09/16 0900    Activity Barriers & Cardiac Risk Stratification   Activity Barriers Shortness of Breath  Cardiac Risk Stratification Low      6 Minute Walk:     6 Minute Walk      01/09/16 1306       6 Minute Walk   Phase Initial     Distance 1155 feet     Walk Time 6 minutes     # of Rest Breaks 0     RPE 13     Perceived Dyspnea  3     Symptoms No     Resting HR 102 bpm     Resting BP 118/74 mmHg     Max Ex. HR 120 bpm     Max Ex. BP 138/74 mmHg        Initial Exercise Prescription:     Initial Exercise Prescription - 01/09/16 1300    Date of Initial Exercise Prescription   Date 01/09/16   Treadmill   MPH 2   Grade 0   Minutes 10   Recumbant Bike   Level 2   RPM 40   Watts 20   Minutes 10   NuStep   Level 2   Watts 40   Minutes 10   Arm Ergometer   Level 1   Watts 10   Minutes 10   Recumbant Elliptical   Level 1   RPM 40   Watts 20   Minutes  10   REL-XR   Level 2   Watts 40   Minutes 10   T5 Nustep   Level 1   Watts 15   Minutes 10   Biostep-RELP   Level 2   Watts 40   Minutes 10   Prescription Details   Frequency (times per week) 3   Duration Progress to 30 minutes of continuous aerobic without signs/symptoms of physical distress   Intensity   THRR REST +  30   Ratings of Perceived Exertion 11-15   Perceived Dyspnea 2-4   Progression   Progression Continue progressive overload as per policy without signs/symptoms or physical distress.   Resistance Training   Training Prescription Yes   Weight 2   Reps 10-15      Perform Capillary Blood Glucose checks as needed.  Exercise Prescription Changes:     Exercise Prescription Changes      01/24/16 1500           Response to Exercise   Blood Pressure (Admit) 122/72 mmHg       Blood Pressure (Exercise) 140/80 mmHg       Blood Pressure (Exit) 122/60 mmHg       Heart Rate (Admit) 106 bpm       Heart Rate (Exercise) 113 bpm       Heart Rate (Exit) 104 bpm       Oxygen Saturation (Admit) 97 %       Oxygen Saturation (Exercise) 90 %       Oxygen Saturation (Exit) 97 %       Rating of Perceived Exertion (Exercise) 13       Perceived Dyspnea (Exercise) 3       Symptoms none       Comments [name]'s first day of exercise in [facility]. Met goals and tolerated exercise well.       Frequency Add 2 additional days to program exercise sessions.       Duration Progress to 50 minutes of aerobic without signs/symptoms of physical distress       Intensity Rest + 30  Resistance Training   Training Prescription No       Treadmill   MPH 1.2       Grade 0       Minutes 11       T5 Nustep   Level 1       Watts 15       Minutes 10       Biostep-RELP   Level 2       Watts 40       Minutes 10          Exercise Comments:   Discharge Exercise Prescription (Final Exercise Prescription Changes):     Exercise Prescription Changes - 01/24/16 1500     Response to Exercise   Blood Pressure (Admit) 122/72 mmHg   Blood Pressure (Exercise) 140/80 mmHg   Blood Pressure (Exit) 122/60 mmHg   Heart Rate (Admit) 106 bpm   Heart Rate (Exercise) 113 bpm   Heart Rate (Exit) 104 bpm   Oxygen Saturation (Admit) 97 %   Oxygen Saturation (Exercise) 90 %   Oxygen Saturation (Exit) 97 %   Rating of Perceived Exertion (Exercise) 13   Perceived Dyspnea (Exercise) 3   Symptoms none   Comments Ms Mesler's first day of exercise in [facility]. Met goals and tolerated exercise well.   Frequency Add 2 additional days to program exercise sessions.   Duration Progress to 50 minutes of aerobic without signs/symptoms of physical distress   Intensity Rest + 30   Resistance Training   Training Prescription No   Treadmill   MPH 1.2   Grade 0   Minutes 11   T5 Nustep   Level 1   Watts 15   Minutes 10   Biostep-RELP   Level 2   Watts 40   Minutes 10       Nutrition:  Target Goals: Understanding of nutrition guidelines, daily intake of sodium <159m, cholesterol <2063m calories 30% from fat and 7% or less from saturated fats, daily to have 5 or more servings of fruits and vegetables.  Biometrics:     Pre Biometrics - 01/10/16 1105    Pre Biometrics   Height _0  (1.549 m)   Weight 115 lb 12.8 oz (52.527 kg)   Waist Circumference 30.5 inches   Hip Circumference 36 inches   Waist to Hip Ratio 0.85 %   BMI (Calculated) 21.9       Nutrition Therapy Plan and Nutrition Goals:     Nutrition Therapy & Goals - 01/09/16 0900    Nutrition Therapy   Diet Ms JoStrathmanoes not want to meet with the dietitian. She does her own cooking. She has maintained a weight of 110 to 115lbs her whole life. Ms JoWhitelawoes drink over 8 glasses of fluid, mainly water.      Nutrition Discharge: Rate Your Plate Scores:   Psychosocial: Target Goals: Acknowledge presence or absence of depression, maximize coping skills, provide positive support system.  Participant is able to verbalize types and ability to use techniques and skills needed for reducing stress and depression.  Initial Review & Psychosocial Screening:     Initial Psych Review & Screening - 01/09/16 0900    Initial Review   Current issues with Current Depression   Family Dynamics   Good Support System? Yes   Comments Ms JoWroes dealing with the lose of her husband in 12/16. She does have good support from her 2 daughters that live close to her. She is learning  about COPD and is coping very well with the disease.   Screening Interventions   Interventions Encouraged to exercise;Program counselor consult      Quality of Life Scores:     Quality of Life - 01/09/16 0900    Quality of Life Scores   Health/Function Pre 19.6 %   Socioeconomic Pre 27 %   Psych/Spiritual Pre 22.29 %   Family Pre 19.5 %   GLOBAL Pre 21.56 %      PHQ-9:     Recent Review Flowsheet Data    Depression screen [facility] 2/9 01/09/2016   Decreased Interest 2   Down, Depressed, Hopeless 1   PHQ - 2 Score 3   Altered sleeping 2   Tired, decreased energy 2   Change in appetite 1   Feeling bad or failure about yourself  0   Trouble concentrating 0   Moving slowly or fidgety/restless 0   Suicidal thoughts 0   PHQ-9 Score 8   Difficult doing work/chores Not difficult at all      Psychosocial Evaluation and Intervention:   Psychosocial Re-Evaluation:  Education: Education Goals: Education classes will be provided on a weekly basis, covering required topics. Participant will state understanding/return demonstration of topics presented.  Learning Barriers/Preferences:     Learning Barriers/Preferences - 01/09/16 0900    Learning Barriers/Preferences   Learning Barriers None   Learning Preferences Group Instruction;Individual Instruction;Pictoral;Skilled Demonstration;Verbal Instruction;Video;Written Material      Education Topics: Initial Evaluation Education: - Verbal, written  and demonstration of respiratory meds, RPE/PD scales, oximetry and breathing techniques. Instruction on use of nebulizers and MDIs: cleaning and proper use, rinsing mouth with steroid doses and importance of monitoring MDI activations.          Pulmonary Rehab from 01/24/2016 in [facility] Cardiac and Pulmonary Rehab   Date  01/09/16   Educator  LB   Instruction Review Code  2- meets goals/outcomes      General Nutrition Guidelines/Fats and Fiber: -Group instruction provided by verbal, written material, models and posters to present the general guidelines for heart healthy nutrition. Gives an explanation and review of dietary fats and fiber.   Controlling Sodium/Reading Food Labels: -Group verbal and written material supporting the discussion of sodium use in heart healthy nutrition. Review and explanation with models, verbal and written materials for utilization of the food label.   Exercise Physiology & Risk Factors: - Group verbal and written instruction with models to review the exercise physiology of the cardiovascular system and associated critical values. Details cardiovascular disease risk factors and the goals associated with each risk factor.   Aerobic Exercise & Resistance Training: - Gives group verbal and written discussion on the health impact of inactivity. On the components of aerobic and resistive training programs and the benefits of this training and how to safely progress through these programs.   Flexibility, Balance, General Exercise Guidelines: - Provides group verbal and written instruction on the benefits of flexibility and balance training programs. Provides general exercise guidelines with specific guidelines to those with heart or lung disease. Demonstration and skill practice provided.   Stress Management: - Provides group verbal and written instruction about the health risks of elevated stress, cause of high stress, and healthy ways to reduce  stress.   Depression: - Provides group verbal and written instruction on the correlation between heart/lung disease and depressed mood, treatment options, and the stigmas associated with seeking treatment.   Exercise & Equipment Safety: - Individual verbal instruction and demonstration  of equipment use and safety with use of the equipment.   Infection Prevention: - Provides verbal and written material to individual with discussion of infection control including proper hand washing and proper equipment cleaning during exercise session.   Falls Prevention: - Provides verbal and written material to individual with discussion of falls prevention and safety.      Pulmonary Rehab from 01/24/2016 in [facility] Cardiac and Pulmonary Rehab   Date  01/09/16   Educator  LB   Instruction Review Code  2- meets goals/outcomes      Diabetes: - Individual verbal and written instruction to review signs/symptoms of diabetes, desired ranges of glucose level fasting, after meals and with exercise. Advice that pre and post exercise glucose checks will be done for 3 sessions at entry of program.   Chronic Lung Diseases: - Group verbal and written instruction to review new updates, new respiratory medications, new advancements in procedures and treatments. Provide informative websites and "800" numbers of self-education.   Lung Procedures: - Group verbal and written instruction to describe testing methods done to diagnose lung disease. Review the outcome of test results. Describe the treatment choices: Pulmonary Function Tests, ABGs and oximetry.   Energy Conservation: - Provide group verbal and written instruction for methods to conserve energy, plan and organize activities. Instruct on pacing techniques, use of adaptive equipment and posture/positioning to relieve shortness of breath.   Triggers: - Group verbal and written instruction to review types of environmental controls: home humidity, furnaces,  filters, dust mite/pet prevention, HEPA vacuums. To discuss weather changes, air quality and the benefits of nasal washing.   Exacerbations: - Group verbal and written instruction to provide: warning signs, infection symptoms, calling MD promptly, preventive modes, and value of vaccinations. Review: effective airway clearance, coughing and/or vibration techniques. Create an [employer].   Oxygen: - Individual and group verbal and written instruction on oxygen therapy. Includes supplement oxygen, available portable oxygen systems, continuous and intermittent flow rates, oxygen safety, concentrators, and Medicare reimbursement for oxygen.   Respiratory Medications: - Group verbal and written instruction to review medications for lung disease. Drug class, frequency, complications, importance of spacers, rinsing mouth after steroid MDI's, and proper cleaning methods for nebulizers.      Pulmonary Rehab from 01/24/2016 in [facility] Cardiac and Pulmonary Rehab   Date  01/09/16   Educator  LB   Instruction Review Code  2- meets goals/outcomes      AED/CPR: - Group verbal and written instruction with the use of models to demonstrate the basic use of the AED with the basic ABC's of resuscitation.   Breathing Retraining: - Provides individuals verbal and written instruction on purpose, frequency, and proper technique of diaphragmatic breathing and pursed-lipped breathing. Applies individual practice skills.      Pulmonary Rehab from 01/24/2016 in [facility] Cardiac and Pulmonary Rehab   Date  01/09/16   Educator  LB   Instruction Review Code  2- meets goals/outcomes      Anatomy and Physiology of the Lungs: - Group verbal and written instruction with the use of models to provide basic lung anatomy and physiology related to function, structure and complications of lung disease.   Heart Failure: - Group verbal and written instruction on the basics of heart failure: signs/symptoms, treatments, explanation  of ejection fraction, enlarged heart and cardiomyopathy.   Sleep Apnea: - Individual verbal and written instruction to review Obstructive Sleep Apnea. Review of risk factors, methods for diagnosing and types of masks and machines for  OSA.   Anxiety: - Provides group, verbal and written instruction on the correlation between heart/lung disease and anxiety, treatment options, and management of anxiety.   Relaxation: - Provides group, verbal and written instruction about the benefits of relaxation for patients with heart/lung disease. Also provides patients with examples of relaxation techniques.      Pulmonary Rehab from 01/24/2016 in [facility] Cardiac and Pulmonary Rehab   Date  01/24/16   Educator  Elissa Hefty   Instruction Review Code  2- Meets goals/outcomes      Knowledge Questionnaire Score:     Knowledge Questionnaire Score - 01/09/16 0900    Knowledge Questionnaire Score   Pre Score 10/10       Personal Goals and Risk Factors at Admission:     Personal Goals and Risk Factors at Admission - 01/09/16 1013    Core Components/Risk Factors/Patient Goals on Admission   Sedentary Yes   Intervention (read-only) While in program, learn and follow the exercise prescription taught. Start at a low level workload and increase workload after able to maintain previous level for 30 minutes. Increase time before increasing intensity.  Ms Clinkscales wants to increase her exercise capacity. She currently exercises at [employer] and does water bicycling twice a week. She hasa history of swimming and does miss the sport.   Improve shortness of breath with ADL's Yes   Intervention (read-only) While in program, learn and follow the exercise prescription taught. Start at a low level workload and increase workload ad advised by the exercise physiologist. Increase time before increasing intensity.  Ms Parrales does have shortness of breath and some fear of it. She is not on oxygen and does monitor her O2Sats  with a home oximeter.   Develop more efficient breathing techniques such as purse lipped breathing and diaphragmatic breathing; and practicing self-pacing with activity Yes   Intervention (read-only) --  Ms Kishbaugh does use PLB and states it is very helpful with shortness of  breath.   Increase knowledge of respiratory medications and ability to use respiratory devices properly  Yes   Intervention (read-only) While in program, learn to administer MDI, nebulizer, and spacer properly.;Learn to take respiratory medicine as ordered.;While in program, learn to Clean MDI, nebulizers, and spacers properly.  Ms Cerros uses Breo, Albuterol, and a nebulizer with Duoneb. She does not have a spacer. and plan to give her one when our supply arrives.   Hypertension Yes   Goal Participant will see blood pressure controlled within the values of 140/64m/Hg or within value directed by their physician.   Intervention (read-only) Provide nutrition & aerobic exercise along with prescribed medications to achieve BP 140/90 or less.   Understand more about Heart/Pulmonary Disease. Yes   Intervention While in program utilize professionals for any questions, and attend the education sessions. Great websites to use are www.americanheart.org or www.lung.org for reliable information.  Ms JScarfowas diagnosed with COPD in 2014 and is very interested in learning more about COPD and management of the disease.      Personal Goals and Risk Factors Review:      Goals and Risk Factor Review      01/24/16 1555           Core Components/Risk Factors/Patient Goals Review   Personal Goals Review Develop more efficient breathing techniques such as purse lipped breathing and diaphragmatic breathing and practicing self-pacing with activity.       Review Reviewed PLB technique and qued her to use with exercise goals  Expected Outcomes After one week, independently using PLB with her exercise goals.          Personal  Goals Discharge (Final Personal Goals and Risk Factors Review):      Goals and Risk Factor Review - 01/24/16 1555    Core Components/Risk Factors/Patient Goals Review   Personal Goals Review Develop more efficient breathing techniques such as purse lipped breathing and diaphragmatic breathing and practicing self-pacing with activity.   Review Reviewed PLB technique and qued her to use with exercise goals   Expected Outcomes After one week, independently using PLB with her exercise goals.      ITP Comments:     ITP Comments      01/24/16 1155           ITP Comments Liz should complete her goals by 36th session.          Comments: 30 day review.

## 2016-02-06 ENCOUNTER — Telehealth: Payer: Self-pay | Admitting: Respiratory Therapy

## 2016-02-06 NOTE — Telephone Encounter (Signed)
Jessica Bowman called and informed us that she had 2 fainting episodes, had a stress test, and was told by Dr Ubaldo Glassing to cancel coming to [place] until the results came back from the cardiac test. Plus she is vacationing, so she will not be back until after April 2nd.

## 2016-02-19 ENCOUNTER — Encounter: Payer: Medicare Other | Attending: Specialist

## 2016-02-19 DIAGNOSIS — I10 Essential (primary) hypertension: Secondary | ICD-10-CM | POA: Insufficient documentation

## 2016-02-19 DIAGNOSIS — J449 Chronic obstructive pulmonary disease, unspecified: Secondary | ICD-10-CM | POA: Insufficient documentation

## 2016-02-19 DIAGNOSIS — M810 Age-related osteoporosis without current pathological fracture: Secondary | ICD-10-CM | POA: Insufficient documentation

## 2016-02-19 DIAGNOSIS — H409 Unspecified glaucoma: Secondary | ICD-10-CM | POA: Insufficient documentation

## 2016-02-28 ENCOUNTER — Encounter: Admission: RE | Payer: Self-pay | Source: Ambulatory Visit

## 2016-02-28 ENCOUNTER — Ambulatory Visit: Admission: RE | Admit: 2016-02-28 | Payer: Medicare Other | Source: Ambulatory Visit | Admitting: Ophthalmology

## 2016-02-28 SURGERY — PHACOEMULSIFICATION, CATARACT, WITH IOL INSERTION
Anesthesia: Topical | Laterality: Right

## 2016-03-04 ENCOUNTER — Encounter: Payer: Medicare Other | Admitting: Respiratory Therapy

## 2016-03-05 NOTE — Progress Notes (Signed)
Pulmonary Individual Treatment Plan  Patient Details  Name: Jessica Bowman MRN: [id number] Date of Birth: 07-Dec-1940 Referring Provider: Dr Wallene Huh   Initial Encounter Date: 01/09/2016      Pulmonary Rehab from 01/09/2016 in [facility] Cardiac and Pulmonary Rehab   Date  01/09/16      Visit Diagnosis: No diagnosis found.  Patient's Home Medications on Admission:  Current outpatient prescriptions:    albuterol (PROAIR HFA) 108 (90 Base) MCG/ACT inhaler, Inhale into the lungs., Disp: , Rfl:    aspirin EC 81 MG tablet, Take by mouth., Disp: , Rfl:    Cholecalciferol (VITAMIN D3) 1000 units CAPS, Take by mouth., Disp: , Rfl:    cyclobenzaprine (FLEXERIL) 5 MG tablet, Take by mouth., Disp: , Rfl:    escitalopram (LEXAPRO) 10 MG tablet, , Disp: , Rfl:    fluticasone furoate-vilanterol (BREO ELLIPTA) 100-25 MCG/INH AEPB, Inhale into the lungs., Disp: , Rfl:    ipratropium-albuterol (DUONEB) 0.5-2.5 (3) MG/3ML SOLN, , Disp: , Rfl:    irbesartan-hydrochlorothiazide (AVALIDE) 150-12.5 MG tablet, Take by mouth., Disp: , Rfl:    IRON PO, Take by mouth., Disp: , Rfl:    loratadine (CLARITIN) 10 MG tablet, Take by mouth., Disp: , Rfl:    Melatonin 5 MG TABS, Take 1 each by mouth at bedtime as needed., Disp: , Rfl:    Probiotic Product ([place]), Take by mouth., Disp: , Rfl:   Past Medical History: Past Medical History  Diagnosis Date   Hypertension    Glaucoma    COPD (chronic obstructive pulmonary disease)    Osteoporosis     Tobacco Use: History  Smoking status   Not on file  Smokeless tobacco   Not on file    Labs: Recent Review Flowsheet Data    There is no flowsheet data to display.       ADL [facility]:     Pulmonary Assessment Scores      01/09/16 0900       ADL [facility]SD   ADL Phase Entry     SOB Score total 50     Rest 0     Walk 3     Stairs 3     Bath 1     Dress 1     Shop 4        Pulmonary Function Assessment:      Pulmonary Function Assessment - 01/09/16 0900    Initial Spirometry Results   FVC% 81 %   FEV1% 59 %   FEV1/FVC Ratio 56.77   Post Bronchodilator Spirometry Results   FVC% 84 %   FEV1% 67 %   FEV1/FVC Ratio 59.69   Breath   Bilateral Breath Sounds Decreased;Clear   Shortness of Breath Yes;Fear of Shortness of Breath;Limiting activity      Exercise Target Goals:    Exercise Program Goal: Individual exercise prescription set with THRR, safety & activity barriers. Participant demonstrates ability to understand and report RPE using BORG scale, to self-measure pulse accurately, and to acknowledge the importance of the exercise prescription.  Exercise Prescription Goal: Starting with aerobic activity 30 plus minutes a day, 3 days per week for initial exercise prescription. Provide home exercise prescription and guidelines that participant acknowledges understanding prior to discharge.  Activity Barriers & Risk Stratification:     Activity Barriers & Cardiac Risk Stratification - 01/09/16 0900    Activity Barriers & Cardiac Risk Stratification   Activity Barriers Shortness of Breath   Cardiac Risk Stratification  Low      6 Minute Walk:     6 Minute Walk      01/09/16 1306       6 Minute Walk   Phase Initial     Distance 1155 feet     Walk Time 6 minutes     # of Rest Breaks 0     RPE 13     Perceived Dyspnea  3     Symptoms No     Resting HR 102 bpm     Resting BP 118/74 mmHg     Max Ex. HR 120 bpm     Max Ex. BP 138/74 mmHg        Initial Exercise Prescription:     Initial Exercise Prescription - 01/09/16 1300    Date of Initial Exercise RX and Referring Provider   Date 01/09/16   Treadmill   MPH 2   Grade 0   Minutes 10   Recumbant Bike   Level 2   RPM 40   Watts 20   Minutes 10   NuStep   Level 2   Watts 40   Minutes 10   Arm Ergometer   Level 1   Watts 10   Minutes 10   Recumbant Elliptical   Level 1   RPM 40   Watts 20   Minutes 10    REL-XR   Level 2   Watts 40   Minutes 10   T5 Nustep   Level 1   Watts 15   Minutes 10   Biostep-RELP   Level 2   Watts 40   Minutes 10   Prescription Details   Frequency (times per week) 3   Duration Progress to 30 minutes of continuous aerobic without signs/symptoms of physical distress   Intensity   THRR REST +  30   Ratings of Perceived Exertion 11-15   Perceived Dyspnea 2-4   Progression   Progression Continue progressive overload as per policy without signs/symptoms or physical distress.   Resistance Training   Training Prescription Yes   Weight 2   Reps 10-15      Perform Capillary Blood Glucose checks as needed.  Exercise Prescription Changes:     Exercise Prescription Changes      01/24/16 1500           Response to Exercise   Blood Pressure (Admit) 122/72 mmHg       Blood Pressure (Exercise) 140/80 mmHg       Blood Pressure (Exit) 122/60 mmHg       Heart Rate (Admit) 106 bpm       Heart Rate (Exercise) 113 bpm       Heart Rate (Exit) 104 bpm       Oxygen Saturation (Admit) 97 %       Oxygen Saturation (Exercise) 90 %       Oxygen Saturation (Exit) 97 %       Rating of Perceived Exertion (Exercise) 13       Perceived Dyspnea (Exercise) 3       Symptoms none       Comments Jessica Bowman's first day of exercise in [facility]. Met goals and tolerated exercise well.       Duration Progress to 50 minutes of aerobic without signs/symptoms of physical distress       Intensity Rest + 30       Resistance Training   Training Prescription No  Treadmill   MPH 1.2       Grade 0       Minutes 11       T5 Nustep   Level 1       Watts 15       Minutes 10       Biostep-RELP   Level 2       Watts 40       Minutes 10       Home Exercise Plan   Frequency Add 2 additional days to program exercise sessions.          Exercise Comments:   Discharge Exercise Prescription (Final Exercise Prescription Changes):     Exercise Prescription Changes -  01/24/16 1500    Response to Exercise   Blood Pressure (Admit) 122/72 mmHg   Blood Pressure (Exercise) 140/80 mmHg   Blood Pressure (Exit) 122/60 mmHg   Heart Rate (Admit) 106 bpm   Heart Rate (Exercise) 113 bpm   Heart Rate (Exit) 104 bpm   Oxygen Saturation (Admit) 97 %   Oxygen Saturation (Exercise) 90 %   Oxygen Saturation (Exit) 97 %   Rating of Perceived Exertion (Exercise) 13   Perceived Dyspnea (Exercise) 3   Symptoms none   Comments Jessica Bowman's first day of exercise in [facility]. Met goals and tolerated exercise well.   Duration Progress to 50 minutes of aerobic without signs/symptoms of physical distress   Intensity Rest + 30   Resistance Training   Training Prescription No   Treadmill   MPH 1.2   Grade 0   Minutes 11   T5 Nustep   Level 1   Watts 15   Minutes 10   Biostep-RELP   Level 2   Watts 40   Minutes 10   Home Exercise Plan   Frequency Add 2 additional days to program exercise sessions.       Nutrition:  Target Goals: Understanding of nutrition guidelines, daily intake of sodium <1554m, cholesterol <206m calories 30% from fat and 7% or less from saturated fats, daily to have 5 or more servings of fruits and vegetables.  Biometrics:     Pre Biometrics - 01/10/16 1105    Pre Biometrics   Height '5\' 1"'  (1.549 m)   Weight 115 lb 12.8 oz (52.527 kg)   Waist Circumference 30.5 inches   Hip Circumference 36 inches   Waist to Hip Ratio 0.85 %   BMI (Calculated) 21.9       Nutrition Therapy Plan and Nutrition Goals:     Nutrition Therapy & Goals - 01/09/16 0900    Nutrition Therapy   Diet Jessica Bowman not want to meet with the dietitian. She does her own cooking. She has maintained a weight of 110 to 115lbs her whole life. Jessica Bowman drink over 8 glasses of fluid, mainly water.      Nutrition Discharge: Rate Your Plate Scores:   Psychosocial: Target Goals: Acknowledge presence or absence of depression, maximize coping skills,  provide positive support system. Participant is able to verbalize types and ability to use techniques and skills needed for reducing stress and depression.  Initial Review & Psychosocial Screening:     Initial Psych Review & Screening - 01/09/16 0900    Initial Review   Current issues with Current Depression   Family Dynamics   Good Support System? Yes   Comments Jessica Bowman dealing with the lose of her husband in 12/16. She does have good support  from her 2 daughters that live close to her. She is learning about COPD and is coping very well with the disease.   Screening Interventions   Interventions Encouraged to exercise;Program counselor consult      Quality of Life Scores:     Quality of Life - 01/09/16 0900    Quality of Life Scores   Health/Function Pre 19.6 %   Socioeconomic Pre 27 %   Psych/Spiritual Pre 22.29 %   Family Pre 19.5 %   GLOBAL Pre 21.56 %      PHQ-9:     Recent Review Flowsheet Data    Depression screen [facility] 2/9 01/09/2016   Decreased Interest 2   Down, Depressed, Hopeless 1   PHQ - 2 Score 3   Altered sleeping 2   Tired, decreased energy 2   Change in appetite 1   Feeling bad or failure about yourself  0   Trouble concentrating 0   Moving slowly or fidgety/restless 0   Suicidal thoughts 0   PHQ-9 Score 8   Difficult doing work/chores Not difficult at all      Psychosocial Evaluation and Intervention:   Psychosocial Re-Evaluation:  Education: Education Goals: Education classes will be provided on a weekly basis, covering required topics. Participant will state understanding/return demonstration of topics presented.  Learning Barriers/Preferences:     Learning Barriers/Preferences - 01/09/16 0900    Learning Barriers/Preferences   Learning Barriers None   Learning Preferences Group Instruction;Individual Instruction;Pictoral;Skilled Demonstration;Verbal Instruction;Video;Written Material      Education Topics: Initial Evaluation  Education: - Verbal, written and demonstration of respiratory meds, RPE/PD scales, oximetry and breathing techniques. Instruction on use of nebulizers and MDIs: cleaning and proper use, rinsing mouth with steroid doses and importance of monitoring MDI activations.          Pulmonary Rehab from 01/24/2016 in [facility] Cardiac and Pulmonary Rehab   Date  01/09/16   Educator  LB   Instruction Review Code  2- meets goals/outcomes      General Nutrition Guidelines/Fats and Fiber: -Group instruction provided by verbal, written material, models and posters to present the general guidelines for heart healthy nutrition. Gives an explanation and review of dietary fats and fiber.   Controlling Sodium/Reading Food Labels: -Group verbal and written material supporting the discussion of sodium use in heart healthy nutrition. Review and explanation with models, verbal and written materials for utilization of the food label.   Exercise Physiology & Risk Factors: - Group verbal and written instruction with models to review the exercise physiology of the cardiovascular system and associated critical values. Details cardiovascular disease risk factors and the goals associated with each risk factor.   Aerobic Exercise & Resistance Training: - Gives group verbal and written discussion on the health impact of inactivity. On the components of aerobic and resistive training programs and the benefits of this training and how to safely progress through these programs.   Flexibility, Balance, General Exercise Guidelines: - Provides group verbal and written instruction on the benefits of flexibility and balance training programs. Provides general exercise guidelines with specific guidelines to those with heart or lung disease. Demonstration and skill practice provided.   Stress Management: - Provides group verbal and written instruction about the health risks of elevated stress, cause of high stress, and healthy ways  to reduce stress.   Depression: - Provides group verbal and written instruction on the correlation between heart/lung disease and depressed mood, treatment options, and the stigmas associated with seeking treatment.  Exercise & Equipment Safety: - Individual verbal instruction and demonstration of equipment use and safety with use of the equipment.   Infection Prevention: - Provides verbal and written material to individual with discussion of infection control including proper hand washing and proper equipment cleaning during exercise session.   Falls Prevention: - Provides verbal and written material to individual with discussion of falls prevention and safety.      Pulmonary Rehab from 01/24/2016 in [facility] Cardiac and Pulmonary Rehab   Date  01/09/16   Educator  LB   Instruction Review Code  2- meets goals/outcomes      Diabetes: - Individual verbal and written instruction to review signs/symptoms of diabetes, desired ranges of glucose level fasting, after meals and with exercise. Advice that pre and post exercise glucose checks will be done for 3 sessions at entry of program.   Chronic Lung Diseases: - Group verbal and written instruction to review new updates, new respiratory medications, new advancements in procedures and treatments. Provide informative websites and "800" numbers of self-education.   Lung Procedures: - Group verbal and written instruction to describe testing methods done to diagnose lung disease. Review the outcome of test results. Describe the treatment choices: Pulmonary Function Tests, ABGs and oximetry.   Energy Conservation: - Provide group verbal and written instruction for methods to conserve energy, plan and organize activities. Instruct on pacing techniques, use of adaptive equipment and posture/positioning to relieve shortness of breath.   Triggers: - Group verbal and written instruction to review types of environmental controls: home humidity,  furnaces, filters, dust mite/pet prevention, HEPA vacuums. To discuss weather changes, air quality and the benefits of nasal washing.   Exacerbations: - Group verbal and written instruction to provide: warning signs, infection symptoms, calling MD promptly, preventive modes, and value of vaccinations. Review: effective airway clearance, coughing and/or vibration techniques. Create an Sports [employer].   Oxygen: - Individual and group verbal and written instruction on oxygen therapy. Includes supplement oxygen, available portable oxygen systems, continuous and intermittent flow rates, oxygen safety, concentrators, and Medicare reimbursement for oxygen.   Respiratory Medications: - Group verbal and written instruction to review medications for lung disease. Drug class, frequency, complications, importance of spacers, rinsing mouth after steroid MDI's, and proper cleaning methods for nebulizers.      Pulmonary Rehab from 01/24/2016 in [facility] Cardiac and Pulmonary Rehab   Date  01/09/16   Educator  LB   Instruction Review Code  2- meets goals/outcomes      AED/CPR: - Group verbal and written instruction with the use of models to demonstrate the basic use of the AED with the basic ABC's of resuscitation.   Breathing Retraining: - Provides individuals verbal and written instruction on purpose, frequency, and proper technique of diaphragmatic breathing and pursed-lipped breathing. Applies individual practice skills.      Pulmonary Rehab from 01/24/2016 in [facility] Cardiac and Pulmonary Rehab   Date  01/09/16   Educator  LB   Instruction Review Code  2- meets goals/outcomes      Anatomy and Physiology of the Lungs: - Group verbal and written instruction with the use of models to provide basic lung anatomy and physiology related to function, structure and complications of lung disease.   Heart Failure: - Group verbal and written instruction on the basics of heart failure: signs/symptoms, treatments,  explanation of ejection fraction, enlarged heart and cardiomyopathy.   Sleep Apnea: - Individual verbal and written instruction to review Obstructive Sleep Apnea. Review of risk factors,  methods for diagnosing and types of masks and machines for OSA.   Anxiety: - Provides group, verbal and written instruction on the correlation between heart/lung disease and anxiety, treatment options, and management of anxiety.   Relaxation: - Provides group, verbal and written instruction about the benefits of relaxation for patients with heart/lung disease. Also provides patients with examples of relaxation techniques.      Pulmonary Rehab from 01/24/2016 in [facility] Cardiac and Pulmonary Rehab   Date  01/24/16   Educator  Elissa Hefty   Instruction Review Code  2- Meets goals/outcomes      Knowledge Questionnaire Score:     Knowledge Questionnaire Score - 01/09/16 0900    Knowledge Questionnaire Score   Pre Score 10/10       Core Components/Risk Factors/Patient Goals at Admission:     Personal Goals and Risk Factors at Admission - 01/09/16 1013    Core Components/Risk Factors/Patient Goals on Admission   Sedentary Yes   Intervention (read-only) While in program, learn and follow the exercise prescription taught. Start at a low level workload and increase workload after able to maintain previous level for 30 minutes. Increase time before increasing intensity.  Jessica Bowman wants to increase her exercise capacity. She currently exercises at [employer] and does water bicycling twice a week. She hasa history of swimming and does miss the sport.   Improve shortness of breath with ADL's Yes   Intervention (read-only) While in program, learn and follow the exercise prescription taught. Start at a low level workload and increase workload ad advised by the exercise physiologist. Increase time before increasing intensity.  Jessica Bowman does have shortness of breath and some fear of it. She is not on oxygen and  does monitor her O2Sats with a home oximeter.   Develop more efficient breathing techniques such as purse lipped breathing and diaphragmatic breathing; and practicing self-pacing with activity Yes   Intervention (read-only) --  Jessica Bowman does use PLB and states it is very helpful with shortness of  breath.   Increase knowledge of respiratory medications and ability to use respiratory devices properly  Yes   Intervention (read-only) While in program, learn to administer MDI, nebulizer, and spacer properly.;Learn to take respiratory medicine as ordered.;While in program, learn to Clean MDI, nebulizers, and spacers properly.  Jessica Bowman uses Breo, Albuterol, and a nebulizer with Duoneb. She does not have a spacer. and plan to give her one when our supply arrives.   Hypertension Yes   Goal Participant will see blood pressure controlled within the values of 140/28m/Hg or within value directed by their physician.   Intervention (read-only) Provide nutrition & aerobic exercise along with prescribed medications to achieve BP 140/90 or less.   Understand more about Heart/Pulmonary Disease. Yes   Intervention While in program utilize professionals for any questions, and attend the education sessions. Great websites to use are www.americanheart.org or www.lung.org for reliable information.  Jessica JAntonopouloswas diagnosed with COPD in 2014 and is very interested in learning more about COPD and management of the disease.      Core Components/Risk Factors/Patient Goals Review:      Goals and Risk Factor Review      01/24/16 1555           Core Components/Risk Factors/Patient Goals Review   Personal Goals Review Develop more efficient breathing techniques such as purse lipped breathing and diaphragmatic breathing and practicing self-pacing with activity.       Review Reviewed PLB technique  and qued her to use with exercise goals       Expected Outcomes After one week, independently using PLB with her  exercise goals.          Core Components/Risk Factors/Patient Goals at Discharge (Final Review):      Goals and Risk Factor Review - 01/24/16 1555    Core Components/Risk Factors/Patient Goals Review   Personal Goals Review Develop more efficient breathing techniques such as purse lipped breathing and diaphragmatic breathing and practicing self-pacing with activity.   Review Reviewed PLB technique and qued her to use with exercise goals   Expected Outcomes After one week, independently using PLB with her exercise goals.      ITP Comments:     ITP Comments      01/24/16 1155           ITP Comments Maddyx should complete her goals by 36th session.          Comments: 30 day chart review; Jessica Shanker plans to return to [place] on 03/06/2016.

## 2016-03-06 DIAGNOSIS — I10 Essential (primary) hypertension: Secondary | ICD-10-CM | POA: Diagnosis not present

## 2016-03-06 DIAGNOSIS — H409 Unspecified glaucoma: Secondary | ICD-10-CM | POA: Diagnosis not present

## 2016-03-06 DIAGNOSIS — J449 Chronic obstructive pulmonary disease, unspecified: Secondary | ICD-10-CM | POA: Diagnosis present

## 2016-03-06 DIAGNOSIS — M810 Age-related osteoporosis without current pathological fracture: Secondary | ICD-10-CM | POA: Diagnosis not present

## 2016-03-06 NOTE — Progress Notes (Signed)
Daily Session Note  Patient Details  Name: Jessica Bowman MRN: [id number] Date of Birth: [birth year] Referring Provider:    Encounter Date: 03/06/2016  Check In:     Session Check In - 03/06/16 1207    Check-In   Location [facility]-Cardiac & Pulmonary Rehab   Staff Present Carson Myrtle, BS, RRT, Respiratory Therapist;Carroll Enterkin, RN, Alex Gardener, DPT, CEEA   Supervising physician immediately available to respond to emergencies [facility] immediately available ER MD   Physician(s) Clearnce Hasten and Edd Fabian   Medication changes reported     No   Fall or balance concerns reported    No   Warm-up and Cool-down Performed on first and last piece of equipment   Resistance Training Performed Yes   VAD Patient? No         Goals Met:  Independence with exercise equipment Exercise tolerated well No report of cardiac concerns or symptoms Strength training completed today  Goals Unmet:  Not Applicable  Comments: Patient completed exercise prescription and all exercise goals during rehab session. The exercise was tolerated well and the patient is progressing in the program.    Dr. Emily Filbert is Medical Director for [place] and [facility].

## 2016-03-08 ENCOUNTER — Encounter: Payer: Medicare Other | Admitting: *Deleted

## 2016-03-08 DIAGNOSIS — J449 Chronic obstructive pulmonary disease, unspecified: Secondary | ICD-10-CM

## 2016-03-08 NOTE — Progress Notes (Signed)
Daily Session Note  Patient Details  Name: Jessica Bowman MRN: [id number] Date of Birth: 1941/04/08 Referring Provider:    Encounter Date: 03/08/2016  Check In:     Session Check In - 03/08/16 1203    Check-In   Location [facility]-Cardiac & Pulmonary Rehab   Staff Present Heath Lark, RN, BSN, CCRP;Maximilliano Kersh, RN, BSN   Supervising physician immediately available to respond to emergencies [facility] immediately available ER MD   Physician(s) Emergency planning/management officer and [place]uchowski   Medication changes reported     No   Fall or balance concerns reported    No   Warm-up and Cool-down Performed on first and last piece of equipment   Resistance Training Performed Yes   Pain Assessment   Currently in Pain? No/denies         Goals Met:  Proper associated with RPD/PD & O2 Sat Exercise tolerated well  Goals Unmet:  Not Applicable  Comments:     Dr. Emily Filbert is Medical Director for [place] and [facility].

## 2016-03-13 DIAGNOSIS — J449 Chronic obstructive pulmonary disease, unspecified: Secondary | ICD-10-CM

## 2016-03-13 NOTE — Progress Notes (Signed)
Daily Session Note  Patient Details  Name: Jessica Bowman MRN: [id number] Date of Birth: [birth year] Referring Provider:    Encounter Date: 03/13/2016  Check In:     Session Check In - 03/13/16 1228    Check-In   Location [facility]-Cardiac & Pulmonary Rehab   Staff Present Nyoka Cowden, RN;Laureen Owens Shark, BS, RRT, Respiratory Therapist;Tyjai Matuszak Brayton El, DPT, [place] physician immediately available to respond to emergencies [facility] immediately available ER MD   Physician(s) Cinda Quest and Joni Fears   Medication changes reported     No   Fall or balance concerns reported    No   Warm-up and Cool-down Performed on first and last piece of equipment   Resistance Training Performed Yes   VAD Patient? No         Goals Met:  Exercise tolerated well Strength training completed today  Goals Unmet:  Not Applicable  Comments: Patient completed exercise prescription and all exercise goals during rehab session. The exercise was tolerated well and the patient is progressing in the program.    Dr. Emily Filbert is Medical Director for [place] and [facility].

## 2016-03-15 ENCOUNTER — Encounter: Payer: Medicare Other | Admitting: Respiratory Therapy

## 2016-03-15 DIAGNOSIS — J449 Chronic obstructive pulmonary disease, unspecified: Secondary | ICD-10-CM

## 2016-03-15 NOTE — Progress Notes (Signed)
Daily Session Note  Patient Details  Name: Jessica Bowman MRN: [id number] Date of Birth: 09/02/1941 Referring Provider:    Encounter Date: 03/15/2016  Check In:     Session Check In - 03/15/16 1301    Check-In   Staff Present Nyoka Cowden, RN;Susanne Bice, RN, BSN, CCRP;Hend Mccarrell Blanch Media, RRT, RCP, Respiratory Therapist   Supervising physician immediately available to respond to emergencies [facility] immediately available ER MD   Physician(s) Dr. Edd Fabian and Dr. Jimmye Norman   Medication changes reported     No   Fall or balance concerns reported    No   Warm-up and Cool-down Performed on first and last piece of equipment   Resistance Training Performed Yes   VAD Patient? No   Pain Assessment   Currently in Pain? No/denies         Goals Met:  Proper associated with RPD/PD & O2 Sat Independence with exercise equipment Exercise tolerated well Strength training completed today  Goals Unmet:  Not Applicable  Comments:    Dr. Emily Filbert is Medical Director for [facility] and [facility].

## 2016-03-18 ENCOUNTER — Encounter: Payer: Medicare Other | Attending: Specialist

## 2016-03-18 ENCOUNTER — Other Ambulatory Visit: Payer: Self-pay | Admitting: Internal Medicine

## 2016-03-18 DIAGNOSIS — Z1231 Encounter for screening mammogram for malignant neoplasm of breast: Secondary | ICD-10-CM

## 2016-03-18 DIAGNOSIS — H409 Unspecified glaucoma: Secondary | ICD-10-CM | POA: Insufficient documentation

## 2016-03-18 DIAGNOSIS — I10 Essential (primary) hypertension: Secondary | ICD-10-CM | POA: Insufficient documentation

## 2016-03-18 DIAGNOSIS — J449 Chronic obstructive pulmonary disease, unspecified: Secondary | ICD-10-CM | POA: Insufficient documentation

## 2016-03-18 DIAGNOSIS — M810 Age-related osteoporosis without current pathological fracture: Secondary | ICD-10-CM | POA: Insufficient documentation

## 2016-03-22 ENCOUNTER — Encounter: Payer: Medicare Other | Admitting: *Deleted

## 2016-03-22 DIAGNOSIS — J449 Chronic obstructive pulmonary disease, unspecified: Secondary | ICD-10-CM

## 2016-03-22 DIAGNOSIS — H409 Unspecified glaucoma: Secondary | ICD-10-CM | POA: Diagnosis not present

## 2016-03-22 DIAGNOSIS — M810 Age-related osteoporosis without current pathological fracture: Secondary | ICD-10-CM | POA: Diagnosis not present

## 2016-03-22 DIAGNOSIS — I10 Essential (primary) hypertension: Secondary | ICD-10-CM | POA: Diagnosis not present

## 2016-03-22 NOTE — Progress Notes (Signed)
Daily Session Note  Patient Details  Name: Jessica Bowman MRN: [id number] Date of Birth: 20-Mar-1941 Referring Provider:    Encounter Date: 03/22/2016  Check In:     Session Check In - 03/22/16 1203    Check-In   Staff Present Heath Lark, RN, BSN, CCRP;Carroll Enterkin, RN, Michaela Corner, RRT, RCP, Respiratory Therapist   Supervising physician immediately available to respond to emergencies [facility] immediately available ER MD   Physician(s) Drs: Edd Fabian  and Mariea Clonts   Medication changes reported     No   Fall or balance concerns reported    No   Warm-up and Cool-down Performed on first and last piece of equipment   VAD Patient? No   Pain Assessment   Currently in Pain? No/denies         Goals Met:  Independence with exercise equipment Exercise tolerated well Strength training completed today  Goals Unmet:  Not Applicable  Comments: Doing well with exercise prescription progression.    Dr. Emily Filbert is Medical Director for [facility] and [facility].

## 2016-03-27 ENCOUNTER — Encounter: Payer: Medicare Other | Admitting: *Deleted

## 2016-03-27 DIAGNOSIS — J449 Chronic obstructive pulmonary disease, unspecified: Secondary | ICD-10-CM | POA: Diagnosis not present

## 2016-03-27 NOTE — Progress Notes (Signed)
Daily Session Note  Patient Details  Name: SYRITA DOVEL MRN: [id number] Date of Birth: [birth year] Referring Provider:    Encounter Date: 03/27/2016  Check In:     Session Check In - 03/27/16 1235    Check-In   Location [facility]   Staff Present Carson Myrtle, BS, RRT, Respiratory Therapist;Cherae Marton, RN, Alex Gardener, DPT, CEEA   Supervising physician immediately available to respond to emergencies [facility] immediately available ER MD   Physician(s) Dr. Lovena Le and Dr. Jimmye Norman   Medication changes reported     No   Fall or balance concerns reported    No   Warm-up and Cool-down Performed on first and last piece of equipment   Resistance Training Performed Yes   VAD Patient? No   Pain Assessment   Currently in Pain? No/denies         Goals Met:  Proper associated with RPD/PD & O2 Sat Exercise tolerated well  Goals Unmet:  Not Applicable  Comments:     Dr. Emily Filbert is Medical Director for [facility] and [facility].

## 2016-03-29 ENCOUNTER — Encounter: Payer: Medicare Other | Admitting: *Deleted

## 2016-03-29 DIAGNOSIS — J449 Chronic obstructive pulmonary disease, unspecified: Secondary | ICD-10-CM | POA: Diagnosis not present

## 2016-03-29 NOTE — Progress Notes (Signed)
Daily Session Note  Patient Details  Name: JANKI DIKE MRN: [id number] Date of Birth: December 21, 1940 Referring Provider:    Encounter Date: 03/29/2016  Check In:     Session Check In - 03/29/16 1216    Check-In   Staff Present Heath Lark, RN, BSN, CCRP;Zema Lizardo, RN, Moises Blood, BS, ACSM CEP, Exercise Physiologist   Supervising physician immediately available to respond to emergencies [facility] immediately available ER MD   Physician(s) Drs: Marcelene Butte and Paduchowski   Medication changes reported     No   Fall or balance concerns reported    No   Warm-up and Cool-down Performed on first and last piece of equipment   VAD Patient? No   Pain Assessment   Currently in Pain? No/denies           Exercise Prescription Changes - 03/29/16 1200    Progression   Progression Continue progressive overload as per policy without signs/symptoms or physical distress.   Treadmill   MPH 1.2   Grade 0   Minutes 12   NuStep   Level 2   Watts 40   Minutes 15   Biostep-RELP   Level 2   Watts 20   Minutes 15      Goals Met:  Proper associated with RPD/PD & O2 Sat Exercise tolerated well  Goals Unmet:  Not Applicable  Comments:    Dr. Emily Filbert is Medical Director for [facility] and [facility].

## 2016-04-01 ENCOUNTER — Encounter: Payer: Medicare Other | Admitting: *Deleted

## 2016-04-01 DIAGNOSIS — J449 Chronic obstructive pulmonary disease, unspecified: Secondary | ICD-10-CM | POA: Diagnosis not present

## 2016-04-01 NOTE — Progress Notes (Signed)
Daily Session Note  Patient Details  Name: Jessica Bowman MRN: [id number] Date of Birth: 05/18/1941 Referring Provider:    Encounter Date: 04/01/2016  Check In:     Session Check In - 04/01/16 1226    Check-In   Location [facility]   Staff Present Gerlene Burdock, RN, BSN;Laureen Owens Shark, BS, RRT, Respiratory Therapist;Kelly Amedeo Plenty, BS, ACSM CEP, Exercise Physiologist   Supervising physician immediately available to respond to emergencies [facility] immediately available ER MD   Physician(s) Dr. Reita Cliche and Dr. Corky Downs   Medication changes reported     No   Fall or balance concerns reported    No   Warm-up and Cool-down Performed on first and last piece of equipment   Resistance Training Performed Yes   VAD Patient? No         Goals Met:  Proper associated with RPD/PD & O2 Sat Exercise tolerated well  Goals Unmet:  Not Applicable  Comments:     Dr. Emily Filbert is Medical Director for [facility] and [facility].

## 2016-04-01 NOTE — Progress Notes (Signed)
Pulmonary Individual Treatment Plan  Patient Details  Name: Jessica Bowman MRN: [id number] Date of Birth: 10/16/1941 Referring Provider:    Initial Encounter Date:       Pulmonary Rehab from 01/09/2016 in [facility] Cardiac and Pulmonary Rehab   Date  01/09/16      Visit Diagnosis: Moderate COPD (chronic obstructive pulmonary disease) ([place])  Patient's Home Medications on Admission:  Current outpatient prescriptions:  .  albuterol (PROAIR HFA) 108 (90 Base) MCG/ACT inhaler, Inhale into the lungs., Disp: , Rfl:  .  aspirin EC 81 MG tablet, Take by mouth., Disp: , Rfl:  .  Cholecalciferol (VITAMIN D3) 1000 units CAPS, Take by mouth., Disp: , Rfl:  .  cyclobenzaprine (FLEXERIL) 5 MG tablet, Take by mouth., Disp: , Rfl:  .  escitalopram (LEXAPRO) 10 MG tablet, , Disp: , Rfl:  .  fluticasone furoate-vilanterol (BREO ELLIPTA) 100-25 MCG/INH AEPB, Inhale into the lungs., Disp: , Rfl:  .  ipratropium-albuterol (DUONEB) 0.5-2.5 (3) MG/3ML SOLN, , Disp: , Rfl:  .  irbesartan-hydrochlorothiazide (AVALIDE) 150-12.5 MG tablet, Take by mouth., Disp: , Rfl:  .  IRON PO, Take by mouth., Disp: , Rfl:  .  loratadine (CLARITIN) 10 MG tablet, Take by mouth., Disp: , Rfl:  .  Melatonin 5 MG TABS, Take 1 each by mouth at bedtime as needed., Disp: , Rfl:  .  Probiotic Product ([place]), Take by mouth., Disp: , Rfl:   Past Medical History: Past Medical History  Diagnosis Date  . Hypertension   . Glaucoma   . COPD (chronic obstructive pulmonary disease)   . Osteoporosis     Tobacco Use: History  Smoking status  . Not on file  Smokeless tobacco  . Not on file    Labs: Recent Review Flowsheet Data    There is no flowsheet data to display.       ADL UCSD:     Pulmonary Assessment Scores      01/09/16 0900       ADL UCSD   ADL Phase Entry     SOB Score total 50     Rest 0     Walk 3     Stairs 3     Bath 1     Dress 1     Shop 4        Pulmonary Function  Assessment:     Pulmonary Function Assessment - 01/09/16 0900    Initial Spirometry Results   FVC% 81 %   FEV1% 59 %   FEV1/FVC Ratio 56.77   Post Bronchodilator Spirometry Results   FVC% 84 %   FEV1% 67 %   FEV1/FVC Ratio 59.69   Breath   Bilateral Breath Sounds Decreased;Clear   Shortness of Breath Yes;Fear of Shortness of Breath;Limiting activity      Exercise Target Goals:    Exercise Program Goal: Individual exercise prescription set with THRR, safety & activity barriers. Participant demonstrates ability to understand and report RPE using BORG scale, to self-measure pulse accurately, and to acknowledge the importance of the exercise prescription.  Exercise Prescription Goal: Starting with aerobic activity 30 plus minutes a day, 3 days per week for initial exercise prescription. Provide home exercise prescription and guidelines that participant acknowledges understanding prior to discharge.  Activity Barriers & Risk Stratification:     Activity Barriers & Cardiac Risk Stratification - 01/09/16 0900    Activity Barriers & Cardiac Risk Stratification   Activity Barriers Shortness of Breath   Cardiac  Risk Stratification Low      6 Minute Walk:     6 Minute Walk      01/09/16 1306       6 Minute Walk   Phase Initial     Distance 1155 feet     Walk Time 6 minutes     # of Rest Breaks 0     RPE 13     Perceived Dyspnea  3     Symptoms No     Resting HR 102 bpm     Resting BP 118/74 mmHg     Max Ex. HR 120 bpm     Max Ex. BP 138/74 mmHg        Initial Exercise Prescription:     Initial Exercise Prescription - 01/09/16 1300    Date of Initial Exercise RX and Referring Provider   Date 01/09/16   Treadmill   MPH 2   Grade 0   Minutes 10   Recumbant Bike   Level 2   RPM 40   Watts 20   Minutes 10   NuStep   Level 2   Watts 40   Minutes 10   Arm Ergometer   Level 1   Watts 10   Minutes 10   Recumbant Elliptical   Level 1   RPM 40   Watts 20    Minutes 10   REL-XR   Level 2   Watts 40   Minutes 10   T5 Nustep   Level 1   Watts 15   Minutes 10   Biostep-RELP   Level 2   Watts 40   Minutes 10   Prescription Details   Frequency (times per week) 3   Duration Progress to 30 minutes of continuous aerobic without signs/symptoms of physical distress   Intensity   THRR REST +  30   Ratings of Perceived Exertion 11-15   Perceived Dyspnea 2-4   Progression   Progression Continue progressive overload as per policy without signs/symptoms or physical distress.   Resistance Training   Training Prescription Yes   Weight 2   Reps 10-15      Perform Capillary Blood Glucose checks as needed.  Exercise Prescription Changes:     Exercise Prescription Changes      01/24/16 1500 03/15/16 1300 03/29/16 1200       Response to Exercise   Blood Pressure (Admit) 122/72 mmHg       Blood Pressure (Exercise) 140/80 mmHg       Blood Pressure (Exit) 122/60 mmHg       Heart Rate (Admit) 106 bpm       Heart Rate (Exercise) 113 bpm       Heart Rate (Exit) 104 bpm       Oxygen Saturation (Admit) 97 %       Oxygen Saturation (Exercise) 90 %       Oxygen Saturation (Exit) 97 %       Rating of Perceived Exertion (Exercise) 13       Perceived Dyspnea (Exercise) 3       Symptoms none       Comments Jessica Bowman's first day of exercise in [facility]. Met goals and tolerated exercise well.       Duration Progress to 50 minutes of aerobic without signs/symptoms of physical distress       Intensity Rest + 30       Progression   Progression  Continue progressive overload as per policy  without signs/symptoms or physical distress. Continue progressive overload as per policy without signs/symptoms or physical distress.     Resistance Training   Training Prescription No       Treadmill   MPH 1.2 1.2 1.2     Grade 0 0 0     Minutes '11 12 12     ' NuStep   Level   2     Watts   40     Minutes   15     T5 Nustep   Level 1       Watts 15        Minutes 10       Biostep-RELP   Level '2 2 2     ' Watts 40 20 20     Minutes '10 10 15     ' Home Exercise Plan   Frequency Add 2 additional days to program exercise sessions.          Exercise Comments:   Discharge Exercise Prescription (Final Exercise Prescription Changes):     Exercise Prescription Changes - 03/29/16 1200    Progression   Progression Continue progressive overload as per policy without signs/symptoms or physical distress.   Treadmill   MPH 1.2   Grade 0   Minutes 12   NuStep   Level 2   Watts 40   Minutes 15   Biostep-RELP   Level 2   Watts 20   Minutes 15       Nutrition:  Target Goals: Understanding of nutrition guidelines, daily intake of sodium <1554m, cholesterol <2062m calories 30% from fat and 7% or less from saturated fats, daily to have 5 or more servings of fruits and vegetables.  Biometrics:     Pre Biometrics - 01/10/16 1105    Pre Biometrics   Height '5\' 1"'  (1.549 m)   Weight 115 lb 12.8 oz (52.527 kg)   Waist Circumference 30.5 inches   Hip Circumference 36 inches   Waist to Hip Ratio 0.85 %   BMI (Calculated) 21.9       Nutrition Therapy Plan and Nutrition Goals:     Nutrition Therapy & Goals - 01/09/16 0900    Nutrition Therapy   Diet Jessica JoAldenoes not want to meet with the dietitian. She does her own cooking. She has maintained a weight of 110 to 115lbs her whole life. Jessica JoMandelbaumoes drink over 8 glasses of fluid, mainly water.      Nutrition Discharge: Rate Your Plate Scores:   Psychosocial: Target Goals: Acknowledge presence or absence of depression, maximize coping skills, provide positive support system. Participant is able to verbalize types and ability to use techniques and skills needed for reducing stress and depression.  Initial Review & Psychosocial Screening:     Initial Psych Review & Screening - 01/09/16 0900    Initial Review   Current issues with Current Depression   Family Dynamics   Good  Support System? Yes   Comments Jessica JoFabrys dealing with the lose of her husband in 12/16. She does have good support from her 2 daughters that live close to her. She is learning about COPD and is coping very well with the disease.   Screening Interventions   Interventions Encouraged to exercise;Program counselor consult      Quality of Life Scores:     Quality of Life - 01/09/16 0900    Quality of Life Scores   Health/Function Pre 19.6 %   Socioeconomic Pre 27 %  Psych/Spiritual Pre 22.29 %   Family Pre 19.5 %   GLOBAL Pre 21.56 %      PHQ-9:     Recent Review Flowsheet Data    Depression screen [facility] 2/9 01/09/2016   Decreased Interest 2   Down, Depressed, Hopeless 1   PHQ - 2 Score 3   Altered sleeping 2   Tired, decreased energy 2   Change in appetite 1   Feeling bad or failure about yourself  0   Trouble concentrating 0   Moving slowly or fidgety/restless 0   Suicidal thoughts 0   PHQ-9 Score 8   Difficult doing work/chores Not difficult at all      Psychosocial Evaluation and Intervention:   Psychosocial Re-Evaluation:  Education: Education Goals: Education classes will be provided on a weekly basis, covering required topics. Participant will state understanding/return demonstration of topics presented.  Learning Barriers/Preferences:     Learning Barriers/Preferences - 01/09/16 0900    Learning Barriers/Preferences   Learning Barriers None   Learning Preferences Group Instruction;Individual Instruction;Pictoral;Skilled Demonstration;Verbal Instruction;Video;Written Material      Education Topics: Initial Evaluation Education: - Verbal, written and demonstration of respiratory meds, RPE/PD scales, oximetry and breathing techniques. Instruction on use of nebulizers and MDIs: cleaning and proper use, rinsing mouth with steroid doses and importance of monitoring MDI activations.          Pulmonary Rehab from 03/27/2016 in [facility] Cardiac and Pulmonary  Rehab   Date  01/09/16   Educator  LB   Instruction Review Code  2- meets goals/outcomes      General Nutrition Guidelines/Fats and Fiber: -Group instruction provided by verbal, written material, models and posters to present the general guidelines for heart healthy nutrition. Gives an explanation and review of dietary fats and fiber.   Controlling Sodium/Reading Food Labels: -Group verbal and written material supporting the discussion of sodium use in heart healthy nutrition. Review and explanation with models, verbal and written materials for utilization of the food label.   Exercise Physiology & Risk Factors: - Group verbal and written instruction with models to review the exercise physiology of the cardiovascular system and associated critical values. Details cardiovascular disease risk factors and the goals associated with each risk factor.   Aerobic Exercise & Resistance Training: - Gives group verbal and written discussion on the health impact of inactivity. On the components of aerobic and resistive training programs and the benefits of this training and how to safely progress through these programs.   Flexibility, Balance, General Exercise Guidelines: - Provides group verbal and written instruction on the benefits of flexibility and balance training programs. Provides general exercise guidelines with specific guidelines to those with heart or lung disease. Demonstration and skill practice provided.      Pulmonary Rehab from 03/27/2016 in [facility] Cardiac and Pulmonary Rehab   Date  03/27/16   Educator  Salley Hews, PT`   Instruction Review Code  2- meets goals/outcomes      Stress Management: - Provides group verbal and written instruction about the health risks of elevated stress, cause of high stress, and healthy ways to reduce stress.   Depression: - Provides group verbal and written instruction on the correlation between heart/lung disease and depressed mood, treatment  options, and the stigmas associated with seeking treatment.   Exercise & Equipment Safety: - Individual verbal instruction and demonstration of equipment use and safety with use of the equipment.   Infection Prevention: - Provides verbal and written material to individual with  discussion of infection control including proper hand washing and proper equipment cleaning during exercise session.   Falls Prevention: - Provides verbal and written material to individual with discussion of falls prevention and safety.      Pulmonary Rehab from 03/27/2016 in [facility] Cardiac and Pulmonary Rehab   Date  01/09/16   Educator  LB   Instruction Review Code  2- meets goals/outcomes      Diabetes: - Individual verbal and written instruction to review signs/symptoms of diabetes, desired ranges of glucose level fasting, after meals and with exercise. Advice that pre and post exercise glucose checks will be done for 3 sessions at entry of program.   Chronic Lung Diseases: - Group verbal and written instruction to review new updates, new respiratory medications, new advancements in procedures and treatments. Provide informative websites and "800" numbers of self-education.   Lung Procedures: - Group verbal and written instruction to describe testing methods done to diagnose lung disease. Review the outcome of test results. Describe the treatment choices: Pulmonary Function Tests, ABGs and oximetry.   Energy Conservation: - Provide group verbal and written instruction for methods to conserve energy, plan and organize activities. Instruct on pacing techniques, use of adaptive equipment and posture/positioning to relieve shortness of breath.   Triggers: - Group verbal and written instruction to review types of environmental controls: home humidity, furnaces, filters, dust mite/pet prevention, HEPA vacuums. To discuss weather changes, air quality and the benefits of nasal washing.   Exacerbations: -  Group verbal and written instruction to provide: warning signs, infection symptoms, calling MD promptly, preventive modes, and value of vaccinations. Review: effective airway clearance, coughing and/or vibration techniques. Create an Sports administrator.   Oxygen: - Individual and group verbal and written instruction on oxygen therapy. Includes supplement oxygen, available portable oxygen systems, continuous and intermittent flow rates, oxygen safety, concentrators, and Medicare reimbursement for oxygen.   Respiratory Medications: - Group verbal and written instruction to review medications for lung disease. Drug class, frequency, complications, importance of spacers, rinsing mouth after steroid MDI's, and proper cleaning methods for nebulizers.      Pulmonary Rehab from 03/27/2016 in [facility] Cardiac and Pulmonary Rehab   Date  01/09/16   Educator  LB   Instruction Review Code  2- meets goals/outcomes      AED/CPR: - Group verbal and written instruction with the use of models to demonstrate the basic use of the AED with the basic ABC's of resuscitation.   Breathing Retraining: - Provides individuals verbal and written instruction on purpose, frequency, and proper technique of diaphragmatic breathing and pursed-lipped breathing. Applies individual practice skills.      Pulmonary Rehab from 03/27/2016 in [facility] Cardiac and Pulmonary Rehab   Date  01/09/16   Educator  LB   Instruction Review Code  2- meets goals/outcomes      Anatomy and Physiology of the Lungs: - Group verbal and written instruction with the use of models to provide basic lung anatomy and physiology related to function, structure and complications of lung disease.   Heart Failure: - Group verbal and written instruction on the basics of heart failure: signs/symptoms, treatments, explanation of ejection fraction, enlarged heart and cardiomyopathy.   Sleep Apnea: - Individual verbal and written instruction to review Obstructive  Sleep Apnea. Review of risk factors, methods for diagnosing and types of masks and machines for OSA.   Anxiety: - Provides group, verbal and written instruction on the correlation between heart/lung disease and anxiety, treatment options, and management  of anxiety.   Relaxation: - Provides group, verbal and written instruction about the benefits of relaxation for patients with heart/lung disease. Also provides patients with examples of relaxation techniques.      Pulmonary Rehab from 03/27/2016 in [facility] Cardiac and Pulmonary Rehab   Date  01/24/16   Educator  Elissa Hefty   Instruction Review Code  2- Meets goals/outcomes      Knowledge Questionnaire Score:     Knowledge Questionnaire Score - 01/09/16 0900    Knowledge Questionnaire Score   Pre Score 10/10       Core Components/Risk Factors/Patient Goals at Admission:     Personal Goals and Risk Factors at Admission - 01/09/16 1013    Core Components/Risk Factors/Patient Goals on Admission   Sedentary Yes   Intervention (read-only) While in program, learn and follow the exercise prescription taught. Start at a low level workload and increase workload after able to maintain previous level for 30 minutes. Increase time before increasing intensity.  Jessica Delorey wants to increase her exercise capacity. She currently exercises at [employer] and does water bicycling twice a week. She hasa history of swimming and does miss the sport.   Improve shortness of breath with ADL's Yes   Intervention (read-only) While in program, learn and follow the exercise prescription taught. Start at a low level workload and increase workload ad advised by the exercise physiologist. Increase time before increasing intensity.  Jessica Morrical does have shortness of breath and some fear of it. She is not on oxygen and does monitor her O2Sats with a home oximeter.   Develop more efficient breathing techniques such as purse lipped breathing and diaphragmatic breathing;  and practicing self-pacing with activity Yes   Intervention (read-only) --  Jessica Curb does use PLB and states it is very helpful with shortness of  breath.   Increase knowledge of respiratory medications and ability to use respiratory devices properly  Yes   Intervention (read-only) While in program, learn to administer MDI, nebulizer, and spacer properly.;Learn to take respiratory medicine as ordered.;While in program, learn to Clean MDI, nebulizers, and spacers properly.  Jessica Peden uses Breo, Albuterol, and a nebulizer with Duoneb. She does not have a spacer. and plan to give her one when our supply arrives.   Hypertension Yes   Goal Participant will see blood pressure controlled within the values of 140/78m/Hg or within value directed by their physician.   Intervention (read-only) Provide nutrition & aerobic exercise along with prescribed medications to achieve BP 140/90 or less.   Understand more about Heart/Pulmonary Disease. Yes   Intervention While in program utilize professionals for any questions, and attend the education sessions. Great websites to use are www.americanheart.org or www.lung.org for reliable information.  Jessica JOverbaywas diagnosed with COPD in 2014 and is very interested in learning more about COPD and management of the disease.      Core Components/Risk Factors/Patient Goals Review:      Goals and Risk Factor Review      01/24/16 1555 03/27/16 1457         Core Components/Risk Factors/Patient Goals Review   Personal Goals Review Develop more efficient breathing techniques such as purse lipped breathing and diaphragmatic breathing and practicing self-pacing with activity. Sedentary;Develop more efficient breathing techniques such as purse lipped breathing and diaphragmatic breathing and practicing self-pacing with activity.;Improve shortness of breath with ADL's;Increase knowledge of respiratory medications and ability to use respiratory devices properly.       Review Reviewed PLB  technique and qued her to use with exercise goals Jessica Appelhans is improving her stamina. She has not started back to the bike water exercising, but plans to return to that after [facility]. She paces herself at home with activites tto minimalize her shortness of breath and uses PLB with her exercise goals. We discussed her MDI's and was given an aerochamber for her Proventil. I will check on the Breo  to see if appropriate tto use a spacer with this inhaler.      Expected Outcomes After one week, independently using PLB with her exercise goals.          Core Components/Risk Factors/Patient Goals at Discharge (Final Review):      Goals and Risk Factor Review - 03/27/16 1457    Core Components/Risk Factors/Patient Goals Review   Personal Goals Review Sedentary;Develop more efficient breathing techniques such as purse lipped breathing and diaphragmatic breathing and practicing self-pacing with activity.;Improve shortness of breath with ADL's;Increase knowledge of respiratory medications and ability to use respiratory devices properly.   Review Jessica Wollam is improving her stamina. She has not started back to the bike water exercising, but plans to return to that after [facility]. She paces herself at home with activites tto minimalize her shortness of breath and uses PLB with her exercise goals. We discussed her MDI's and was given an aerochamber for her Proventil. I will check on the Breo  to see if appropriate tto use a spacer with this inhaler.      ITP Comments:     ITP Comments      01/24/16 1155           ITP Comments Kaysey should complete her goals by 36th session.          Comments: 30 day review.

## 2016-04-05 ENCOUNTER — Encounter: Payer: Medicare Other | Admitting: Respiratory Therapy

## 2016-04-05 DIAGNOSIS — J449 Chronic obstructive pulmonary disease, unspecified: Secondary | ICD-10-CM | POA: Diagnosis not present

## 2016-04-05 NOTE — Progress Notes (Signed)
Daily Session Note  Patient Details  Name: Jessica Bowman MRN: [id number] Date of Birth: 1941/08/14 Referring Provider:    Encounter Date: 04/05/2016  Check In:     Session Check In - 04/05/16 1227    Check-In   Location [facility]-Cardiac & Pulmonary Rehab   Staff Present Alberteen Sam, MA, ACSM RCEP, Exercise Physiologist;Stacey Blanch Media, RRT, RCP, Respiratory Therapist;Carroll Enterkin, RN, BSN   Supervising physician immediately available to respond to emergencies [facility] immediately available ER MD   Physician(s) Cinda Quest and Jimmye Norman   Medication changes reported     No   Fall or balance concerns reported    No   Warm-up and Cool-down Performed on first and last piece of equipment   Resistance Training Performed Yes   VAD Patient? No   Pain Assessment   Currently in Pain? No/denies   Multiple Pain Sites No           Exercise Prescription Changes - 04/05/16 1200    Progression   Progression Continue progressive overload as per policy without signs/symptoms or physical distress.   Resistance Training   Training Prescription Yes   Weight 3   Reps 10-12   Treadmill   MPH 1.3   Grade 0   Minutes 15   NuStep   Level 2   Watts 25   Minutes 15   Biostep-RELP   Level 2   Watts 20   Minutes 15      Goals Met:  Proper associated with RPD/PD & O2 Sat Independence with exercise equipment Exercise tolerated well Strength training completed today  Goals Unmet:  Not Applicable  Comments: Pt able to follow exercise prescription without complaints today.   Dr. Emily Filbert is Medical Director for [facility] and [facility].

## 2016-04-08 ENCOUNTER — Encounter: Payer: Medicare Other | Admitting: *Deleted

## 2016-04-08 DIAGNOSIS — J449 Chronic obstructive pulmonary disease, unspecified: Secondary | ICD-10-CM

## 2016-04-08 NOTE — Progress Notes (Signed)
Daily Session Note  Patient Details  Name: Jessica Bowman MRN: [id number] Date of Birth: 06-10-1941 Referring Provider:    Encounter Date: 04/08/2016  Check In:     Session Check In - 04/08/16 1216    Check-In   Location [facility]-Cardiac & Pulmonary Rehab   Staff Present Gerlene Burdock, RN, BSN;Laureen Owens Shark, BS, RRT, Respiratory Orlie Dakin, RN, BSN   Supervising physician immediately available to respond to emergencies [facility] immediately available ER MD   Physician(s) Dr. Mariea Clonts and Dr. Reita Cliche   Medication changes reported     No   Fall or balance concerns reported    No   Warm-up and Cool-down Performed on first and last piece of equipment   Resistance Training Performed Yes   VAD Patient? No         Goals Met:  Proper associated with RPD/PD & O2 Sat Exercise tolerated well  Goals Unmet:  Not Applicable  Comments:     Dr. Emily Filbert is Medical Director for [facility] and [facility].

## 2016-04-12 ENCOUNTER — Encounter: Payer: Medicare Other | Admitting: Respiratory Therapy

## 2016-04-12 DIAGNOSIS — J449 Chronic obstructive pulmonary disease, unspecified: Secondary | ICD-10-CM

## 2016-04-12 NOTE — Progress Notes (Signed)
Daily Session Note  Patient Details  Name: Jessica Bowman MRN: [id number] Date of Birth: 09/20/1941 Referring Provider:    Encounter Date: 04/12/2016  Check In:     Session Check In - 04/12/16 1154    Check-In   Location [facility]   Staff Present Alberteen Sam, MA, ACSM RCEP, Exercise Physiologist;Iria Kellie Shropshire, RN;Stacey Blanch Media, RRT, RCP, Respiratory Therapist;Carroll Enterkin, RN, BSN   Supervising physician immediately available to respond to emergencies [facility] immediately available ER MD   Physician(s) Archie Balboa and Dominic Pea   Medication changes reported     No   Fall or balance concerns reported    No   Warm-up and Cool-down Performed on first and last piece of equipment   Resistance Training Performed Yes   VAD Patient? No   Pain Assessment   Currently in Pain? No/denies   Multiple Pain Sites No         Goals Met:  Proper associated with RPD/PD & O2 Sat Independence with exercise equipment Exercise tolerated well Strength training completed today  Goals Unmet:  Not Applicable  Comments: Pt able to follow exercise prescription today without complaint.  Will continue to monitor for progression.    Dr. Emily Filbert is Medical Director for [place] and [facility].

## 2016-04-17 DIAGNOSIS — J449 Chronic obstructive pulmonary disease, unspecified: Secondary | ICD-10-CM | POA: Diagnosis not present

## 2016-04-17 NOTE — Progress Notes (Signed)
Daily Session Note  Patient Details  Name: Jessica Bowman MRN: [id number] Date of Birth: 11-02-1941 Referring Provider:    Encounter Date: 04/17/2016  Check In:     Session Check In - 04/17/16 1227    Check-In   Location [facility]   Staff Present Carson Myrtle, BS, RRT, Respiratory Therapist;Carroll Enterkin, RN, BSN;Rebecca Sickles, DPT, Burlene Arnt, BA, ACSM CEP, Exercise Physiologist   Supervising physician immediately available to respond to emergencies See telemetry face sheet for immediately available ER MD   Physician(s) Drs Jacqualine Code and Jimmye Norman   Medication changes reported     No   Fall or balance concerns reported    No   Warm-up and Cool-down Performed on first and last piece of equipment   Resistance Training Performed Yes   Pain Assessment   Currently in Pain? No/denies   Multiple Pain Sites No         Goals Met:  Proper associated with RPD/PD & O2 Sat Independence with exercise equipment Exercise tolerated well Strength training completed today  Goals Unmet:  Not Applicable  Comments: Pt able to follow exercise prescription today without complaint.  Will continue to monitor for progression.   Dr. Emily Filbert is Medical Director for [place] and [facility].

## 2016-04-19 ENCOUNTER — Encounter: Payer: Medicare Other | Attending: Specialist | Admitting: *Deleted

## 2016-04-19 DIAGNOSIS — I10 Essential (primary) hypertension: Secondary | ICD-10-CM | POA: Insufficient documentation

## 2016-04-19 DIAGNOSIS — J449 Chronic obstructive pulmonary disease, unspecified: Secondary | ICD-10-CM | POA: Diagnosis not present

## 2016-04-19 DIAGNOSIS — M810 Age-related osteoporosis without current pathological fracture: Secondary | ICD-10-CM | POA: Insufficient documentation

## 2016-04-19 DIAGNOSIS — H409 Unspecified glaucoma: Secondary | ICD-10-CM | POA: Insufficient documentation

## 2016-04-19 NOTE — Progress Notes (Signed)
Daily Session Note  Patient Details  Name: NORETA KUE MRN: [id number] Date of Birth: 08/30/1941 Referring Provider:    Encounter Date: 04/19/2016  Check In:     Session Check In - 04/19/16 1235    Check-In   Location [facility]   Staff Present Frederich Cha, RRT, RCP, Respiratory Therapist;Amanda Oletta Darter, BA, ACSM CEP, Exercise Physiologist   Supervising physician immediately available to respond to emergencies [facility] immediately available ER MD   Physician(s) Dr. Izola Price and Dr. Edd Fabian   Medication changes reported     No   Fall or balance concerns reported    No   Warm-up and Cool-down Performed on first and last piece of equipment   Resistance Training Performed Yes   VAD Patient? No   Pain Assessment   Currently in Pain? No/denies         Goals Met:  Proper associated with RPD/PD & O2 Sat Exercise tolerated well  Goals Unmet:  Not Applicable  Comments:     Dr. Emily Filbert is Medical Director for [place] and [facility].

## 2016-04-19 NOTE — Progress Notes (Signed)
Daily Session Note  Patient Details  Name: Jessica Bowman MRN: [id number] Date of Birth: 1941-01-20 Referring Provider:    Encounter Date: 04/19/2016  Check In:     Session Check In - 04/19/16 1213    Check-In   Location [facility]   Staff Present Frederich Cha, RRT, RCP, Respiratory Dareen Piano, BA, ACSM CEP, Exercise Physiologist;Carroll Enterkin, RN, BSN   Supervising physician immediately available to respond to emergencies [facility] immediately available ER MD   Physician(s) Dr. Edd Fabian and Caro Laroche   Medication changes reported     No   Fall or balance concerns reported    No   Warm-up and Cool-down Performed on first and last piece of equipment   Resistance Training Performed Yes   VAD Patient? No   Pain Assessment   Currently in Pain? No/denies         Goals Met:  Proper associated with RPD/PD & O2 Sat Independence with exercise equipment Exercise tolerated well Personal goals reviewed Strength training completed today  Goals Unmet:  Not Applicable  Comments: Pt able to follow exercise prescription today without complaint.  Will continue to monitor for progression.  Goals were reviewed with Pt today.   Dr. Emily Filbert is Medical Director for [place] and [facility].

## 2016-04-20 ENCOUNTER — Observation Stay
Admission: EM | Admit: 2016-04-20 | Discharge: 2016-04-21 | Disposition: A | Discharge status: Home / Self Care | Payer: Medicare Other | Attending: Internal Medicine | Admitting: Internal Medicine

## 2016-04-20 ENCOUNTER — Other Ambulatory Visit: Payer: Self-pay

## 2016-04-20 ENCOUNTER — Observation Stay
Admit: 2016-04-20 | Discharge: 2016-04-20 | Disposition: A | Discharge status: Home / Self Care | Payer: Medicare Other | Attending: Internal Medicine | Admitting: Internal Medicine

## 2016-04-20 ENCOUNTER — Emergency Department: Payer: Medicare Other

## 2016-04-20 ENCOUNTER — Encounter: Payer: Self-pay | Admitting: Emergency Medicine

## 2016-04-20 DIAGNOSIS — E860 Dehydration: Secondary | ICD-10-CM | POA: Diagnosis not present

## 2016-04-20 DIAGNOSIS — Z8249 Family history of ischemic heart disease and other diseases of the circulatory system: Secondary | ICD-10-CM | POA: Diagnosis not present

## 2016-04-20 DIAGNOSIS — Z8371 Family history of colonic polyps: Secondary | ICD-10-CM | POA: Insufficient documentation

## 2016-04-20 DIAGNOSIS — Z9889 Other specified postprocedural states: Secondary | ICD-10-CM | POA: Insufficient documentation

## 2016-04-20 DIAGNOSIS — Z91040 Latex allergy status: Secondary | ICD-10-CM | POA: Insufficient documentation

## 2016-04-20 DIAGNOSIS — Z79899 Other long term (current) drug therapy: Secondary | ICD-10-CM | POA: Insufficient documentation

## 2016-04-20 DIAGNOSIS — Z7951 Long term (current) use of inhaled steroids: Secondary | ICD-10-CM | POA: Diagnosis not present

## 2016-04-20 DIAGNOSIS — Z9049 Acquired absence of other specified parts of digestive tract: Secondary | ICD-10-CM | POA: Diagnosis not present

## 2016-04-20 DIAGNOSIS — Z8679 Personal history of other diseases of the circulatory system: Secondary | ICD-10-CM | POA: Insufficient documentation

## 2016-04-20 DIAGNOSIS — Z880 Allergy status to penicillin: Secondary | ICD-10-CM | POA: Insufficient documentation

## 2016-04-20 DIAGNOSIS — Z7982 Long term (current) use of aspirin: Secondary | ICD-10-CM | POA: Diagnosis not present

## 2016-04-20 DIAGNOSIS — Z87891 Personal history of nicotine dependence: Secondary | ICD-10-CM | POA: Insufficient documentation

## 2016-04-20 DIAGNOSIS — I10 Essential (primary) hypertension: Secondary | ICD-10-CM | POA: Insufficient documentation

## 2016-04-20 DIAGNOSIS — R55 Syncope and collapse: Secondary | ICD-10-CM | POA: Diagnosis present

## 2016-04-20 DIAGNOSIS — Z825 Family history of asthma and other chronic lower respiratory diseases: Secondary | ICD-10-CM | POA: Insufficient documentation

## 2016-04-20 DIAGNOSIS — M810 Age-related osteoporosis without current pathological fracture: Secondary | ICD-10-CM | POA: Insufficient documentation

## 2016-04-20 DIAGNOSIS — Z888 Allergy status to other drugs, medicaments and biological substances status: Secondary | ICD-10-CM | POA: Insufficient documentation

## 2016-04-20 DIAGNOSIS — J449 Chronic obstructive pulmonary disease, unspecified: Secondary | ICD-10-CM | POA: Diagnosis not present

## 2016-04-20 DIAGNOSIS — E876 Hypokalemia: Secondary | ICD-10-CM | POA: Diagnosis not present

## 2016-04-20 DIAGNOSIS — H409 Unspecified glaucoma: Secondary | ICD-10-CM | POA: Insufficient documentation

## 2016-04-20 LAB — URINALYSIS COMPLETE WITH MICROSCOPIC (ARMC ONLY)
BACTERIA UA: NONE SEEN
Bilirubin Urine: NEGATIVE
Glucose, UA: NEGATIVE mg/dL
HGB URINE DIPSTICK: NEGATIVE
LEUKOCYTES UA: NEGATIVE
Nitrite: NEGATIVE
PH: 7 (ref 5.0–8.0)
PROTEIN: NEGATIVE mg/dL
SPECIFIC GRAVITY, URINE: 1.011 (ref 1.005–1.030)

## 2016-04-20 LAB — COMPREHENSIVE METABOLIC PANEL
ALBUMIN: 3.7 g/dL (ref 3.5–5.0)
ALT: 16 U/L (ref 14–54)
AST: 22 U/L (ref 15–41)
Alkaline Phosphatase: 58 U/L (ref 38–126)
Anion gap: 8 (ref 5–15)
BILIRUBIN TOTAL: 0.5 mg/dL (ref 0.3–1.2)
BUN: 29 mg/dL — AB (ref 6–20)
CHLORIDE: 104 mmol/L (ref 101–111)
CO2: 23 mmol/L (ref 22–32)
Calcium: 8.7 mg/dL — ABNORMAL LOW (ref 8.9–10.3)
Creatinine, Ser: 1 mg/dL (ref 0.44–1.00)
GFR calc Af Amer: >60 mL/min (ref >60)
GFR calc non Af Amer: 54 mL/min — ABNORMAL LOW (ref >60)
GLUCOSE: 106 mg/dL — AB (ref 65–99)
POTASSIUM: 3.4 mmol/L — AB (ref 3.5–5.1)
SODIUM: 135 mmol/L (ref 135–145)
TOTAL PROTEIN: 6.4 g/dL — AB (ref 6.5–8.1)

## 2016-04-20 LAB — CBC WITH DIFFERENTIAL/PLATELET
BASOS ABS: 0.1 10*3/uL (ref 0–0.1)
EOS ABS: 0.2 10*3/uL (ref 0–0.7)
Eosinophils Relative: 2 %
HCT: 34.5 % — ABNORMAL LOW (ref 35.0–47.0)
HEMOGLOBIN: 11.8 g/dL — AB (ref 12.0–16.0)
LYMPHS ABS: 1.2 10*3/uL (ref 1.0–3.6)
Lymphocytes Relative: 16 %
MCH: 30.5 pg (ref 26.0–34.0)
MCHC: 34.3 g/dL (ref 32.0–36.0)
MCV: 88.7 fL (ref 80.0–100.0)
Monocytes Absolute: 0.5 10*3/uL (ref 0.2–0.9)
Monocytes Relative: 7 %
Neutro Abs: 5.6 10*3/uL (ref 1.4–6.5)
Platelets: 307 10*3/uL (ref 150–440)
RBC: 3.89 MIL/uL (ref 3.80–5.20)
RDW: 14.5 % (ref 11.5–14.5)
WBC: 7.5 10*3/uL (ref 3.6–11.0)

## 2016-04-20 LAB — TROPONIN I: Troponin I: <0.03 ng/mL (ref <0.031)

## 2016-04-20 LAB — MAGNESIUM: Magnesium: 1.8 mg/dL (ref 1.7–2.4)

## 2016-04-20 MED ORDER — FLUTICASONE FUROATE-VILANTEROL 100-25 MCG/INH IN AEPB
1.0000 | INHALATION_SPRAY | Freq: Every day | RESPIRATORY_TRACT | Status: DC
  Administered 2016-04-21: 1 via RESPIRATORY_TRACT
  Filled 2016-04-20: qty 28

## 2016-04-20 MED ORDER — ONDANSETRON HCL 4 MG/2ML IJ SOLN: 4.0000 mg | Freq: Four times a day (QID) | INTRAMUSCULAR | Status: DC | PRN

## 2016-04-20 MED ORDER — ACETAMINOPHEN 650 MG RE SUPP: 650.0000 mg | Freq: Four times a day (QID) | RECTAL | Status: DC | PRN

## 2016-04-20 MED ORDER — ASPIRIN EC 81 MG PO TBEC
81.0000 mg | DELAYED_RELEASE_TABLET | Freq: Every day | ORAL | Status: DC
  Administered 2016-04-20: 81 mg via ORAL
  Filled 2016-04-20: qty 1

## 2016-04-20 MED ORDER — ESCITALOPRAM OXALATE 10 MG PO TABS
10.0000 mg | ORAL_TABLET | Freq: Every day | ORAL | Status: DC
  Administered 2016-04-20: 10 mg via ORAL
  Filled 2016-04-20: qty 1

## 2016-04-20 MED ORDER — SODIUM CHLORIDE 0.9 % IV SOLN
INTRAVENOUS | Status: DC
  Administered 2016-04-20 – 2016-04-21 (×2): via INTRAVENOUS

## 2016-04-20 MED ORDER — CYCLOBENZAPRINE HCL 10 MG PO TABS: 5.0000 mg | ORAL_TABLET | Freq: Every day | ORAL | Status: DC | PRN

## 2016-04-20 MED ORDER — POTASSIUM CHLORIDE CRYS ER 20 MEQ PO TBCR
40.0000 meq | EXTENDED_RELEASE_TABLET | Freq: Once | ORAL | Status: AC
  Administered 2016-04-20: 40 meq via ORAL
  Filled 2016-04-20: qty 2

## 2016-04-20 MED ORDER — PHILLIPS COLON HEALTH PO CAPS: ORAL_CAPSULE | Freq: Every day | ORAL | Status: DC

## 2016-04-20 MED ORDER — ESCITALOPRAM OXALATE 10 MG PO TABS: 5.0000 mg | ORAL_TABLET | ORAL | Status: DC

## 2016-04-20 MED ORDER — ONDANSETRON HCL 4 MG PO TABS: 4.0000 mg | ORAL_TABLET | Freq: Four times a day (QID) | ORAL | Status: DC | PRN

## 2016-04-20 MED ORDER — ESCITALOPRAM OXALATE 10 MG PO TABS
5.0000 mg | ORAL_TABLET | Freq: Every day | ORAL | Status: DC
  Administered 2016-04-21: 5 mg via ORAL
  Filled 2016-04-20: qty 1

## 2016-04-20 MED ORDER — ENOXAPARIN SODIUM 40 MG/0.4ML ~~LOC~~ SOLN
40.0000 mg | SUBCUTANEOUS | Status: DC
  Administered 2016-04-20: 40 mg via SUBCUTANEOUS
  Filled 2016-04-20: qty 0.4

## 2016-04-20 MED ORDER — FLUTICASONE FUROATE-VILANTEROL 100-25 MCG/INH IN AEPB: 1.0000 | INHALATION_SPRAY | Freq: Every day | RESPIRATORY_TRACT | Status: DC

## 2016-04-20 MED ORDER — ONDANSETRON HCL 4 MG/2ML IJ SOLN
4.0000 mg | Freq: Once | INTRAMUSCULAR | Status: AC
  Administered 2016-04-20: 4 mg via INTRAVENOUS
  Filled 2016-04-20: qty 2

## 2016-04-20 MED ORDER — SODIUM CHLORIDE 0.9 % IV SOLN
1000.0000 mL | Freq: Once | INTRAVENOUS | Status: AC
  Administered 2016-04-20: 1000 mL via INTRAVENOUS

## 2016-04-20 MED ORDER — ACETAMINOPHEN 325 MG PO TABS: 650.0000 mg | ORAL_TABLET | Freq: Four times a day (QID) | ORAL | Status: DC | PRN

## 2016-04-20 NOTE — ED Provider Notes (Signed)
[facility] Emergency Department Provider Note        Time seen: ----------------------------------------- 12:29 PM on 04/20/2016 -----------------------------------------    I have reviewed the triage vital signs and the nursing notes.   HISTORY  Chief Complaint No chief complaint on file.    HPI Jessica Bowman is a 75 y.o. female who presents the ER for a near syncopal event with vomiting. Patient was at a family reunion at a church today and began feeling very weak and lightheaded. She also had profuse vomiting. Whenever she would move or attempt to get up she was getting really nauseous and have profuse vomiting. Patient does complains of left-sided headache and possibly some left-sided neck cramping. She denies any recent illness, denies recent changes in her medicines.   Past Medical History  Diagnosis Date  . Hypertension   . Glaucoma   . COPD (chronic obstructive pulmonary disease)   . Osteoporosis     Patient Active Problem List   Diagnosis Date Noted  . Anxiety 01/09/2016  . DD (diverticular disease) 01/09/2016  . Bloodgood disease 01/09/2016  . Glaucoma 01/09/2016  . Hypercholesterolemia 01/09/2016  . 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 Menopausal osteoporosis 01/09/2016  . LBP (low back pain) 10/19/2014  . 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 Chronic obstructive pulmonary disease ([place]) 09/08/2014  . Esophagitis, reflux 09/08/2014  . Avitaminosis D 09/08/2014  . BP (high blood pressure) 06/06/2014  . Cephalalgia 03/30/2014  . Hand numbness 03/30/2014    No past surgical history on file.  Allergies Alendronate; Latex; Lisinopril; Penicillin g; Risedronate; Epinephrine; and Penicillins  Social History Social History  Substance Use Topics  . Smoking status: Not on file  . Smokeless tobacco: Not on file  . Alcohol Use: Not on file    Review of Systems Constitutional: Negative for fever. Eyes: Negative for visual  changes. ENT: Negative for sore throat. Cardiovascular: Negative for chest pain. Respiratory: Negative for shortness of breath. Gastrointestinal: Negative for abdominal pain, Positive for vomiting Genitourinary: Negative for dysuria. Musculoskeletal: Negative for back pain. Skin: Negative for rash. Neurological:Positive for headache, generalized weakness  10-point ROS otherwise negative.  ____________________________________________   PHYSICAL EXAM:  VITAL SIGNS: ED Triage Vitals  Enc Vitals Group     BP --      Pulse --      Resp --      Temp --      Temp src --      SpO2 --      Weight --      Height --      Head Cir --      Peak Flow --      Pain Score --      Pain Loc --      Pain Edu? --      Excl. in [place]? --     Constitutional: Alert and oriented. Mild distress Eyes: Conjunctivae are normal. PERRL. Normal extraocular movements. ENT   Head: Normocephalic and atraumatic.   Nose: No congestion/rhinnorhea.   Mouth/Throat: Mucous membranes are moist.   Neck: No stridor. Cardiovascular: Normal rate, regular rhythm. No murmurs, rubs, or gallops. Respiratory: Normal respiratory effort without tachypnea nor retractions. Breath sounds are clear and equal bilaterally. No wheezes/rales/rhonchi. Gastrointestinal: Soft and nontender. Normal bowel sounds Musculoskeletal: Nontender with normal range of motion in all extremities. No lower extremity tenderness nor edema. Neurologic:  Normal speech and language. No gross focal neurologic deficits are appreciated. Cranial nerves appear to be intact Skin:  Skin is warm, dry and intact. No rash noted. Psychiatric: Mood and affect are normal. Speech and behavior are normal.  ____________________________________________  EKG: Interpreted by me. Sinus rhythm with a rate of 69 bpm, first-degree AV block, normal QRS width, Possible long QT, normal axis. Repeat EKG reveals sinus rhythm with a rate of 72 bpm, first-degree AV  block, normal QRS, normal axis. ____________________________________________  ED COURSE:  Pertinent labs & imaging results that were available during my care of the patient were reviewed by me and considered in my medical decision making (see chart for details). Unclear etiology for her symptoms at this time. She'll receive IV fluids, IV Zofran. We will obtain basic labs and imaging. ____________________________________________    LABS (pertinent positives/negatives)  Labs Reviewed  CBC WITH DIFFERENTIAL/PLATELET - Abnormal; Notable for the following:    Hemoglobin 11.8 (*)    HCT 34.5 (*)    All other components within normal limits  COMPREHENSIVE METABOLIC PANEL - Abnormal; Notable for the following:    Potassium 3.4 (*)    Glucose, Bld 106 (*)    BUN 29 (*)    Calcium 8.7 (*)    Total Protein 6.4 (*)    GFR calc non Af Amer 54 (*)    All other components within normal limits  TROPONIN I  URINALYSIS COMPLETEWITH MICROSCOPIC ([facility] ONLY)    RADIOLOGY  CT head  IMPRESSION: No acute intracranial abnormality. ____________________________________________  FINAL ASSESSMENT AND PLAN  Syncope, vomiting  Plan: Patient with labs and imaging as dictated above. Upon further discussion with the family she did have a remarkable syncopal event. Labs do reflect some dehydration that she did not feel significantly better with IV fluids. I will discuss with the hospitalist for admission and possible observation with cardiology consultation   Earleen Newport, MD   Note: This dictation was prepared with Dragon dictation. Any transcriptional errors that result from this process are unintentional   Earleen Newport, MD 04/20/16 1416

## 2016-04-20 NOTE — Care Management Obs Status (Signed)
[place] NOTIFICATION   Patient Details  Name: Jessica Bowman MRN: [id number] Date of Birth: January 15, 1941   Medicare Observation Status Notification Given:  Yes (syncope)    Ival Bible, RN 04/20/2016, 3:42 PM

## 2016-04-20 NOTE — ED Notes (Signed)
Pt arrived by EMS after having a syncopal episode during a funeral service. Per pt's daughters pt had LOC and upon waking had episodes of emesis. Pt states she has been having balance issues but assumed it was COPD related.

## 2016-04-20 NOTE — Progress Notes (Signed)
Jessica Bowman is a 75 y.o. female patient admitted from ED awake, alert - oriented  X 4 - no acute distress noted.  VSS - Blood pressure 141/56, pulse 76, temperature 98 F (36.7 C), temperature source Oral, resp. rate 16, height 5\' 1"  (1.549 m), weight 53.343 kg (117 lb 9.6 oz), SpO2 93 %.    IV in place, occlusive dsg intact without redness.  Orientation to room, and floor completed with information packet given to patient/family.   Admission INP armband ID verified with patient/family, and in place.   SR up x 2, fall assessment complete, with patient and family able to verbalize understanding of risk associated with falls, and verbalized understanding to call nsg before up out of bed.  Call light within reach, patient able to voice, and demonstrate understanding.  Skin, clean-dry- intact without evidence of bruising, or skin tears.   No evidence of skin break down noted on exam. Skin assessed with Georgeanna Lea, RN. Tele box verified with Prince Solian, RN and [place], [place]. Patient takes meds whole.      Will cont to eval and treat per MD orders.  Horton Finer, RN 04/20/2016 6:35 PM

## 2016-04-20 NOTE — H&P (Addendum)
[facility] [place] NAME: Jessica Bowman    MR#:  [id number]  DATE OF BIRTH:  06/05/1941  DATE OF ADMISSION:  04/20/2016  PRIMARY CARE PHYSICIAN: Ezequiel Kayser, MD   REQUESTING/REFERRING PHYSICIAN: Earleen Newport, MD  CHIEF COMPLAINT:  No chief complaint on file. Syncope episode today.  HISTORY OF PRESENT ILLNESS:  Jessica Bowman  is a 75 y.o. female with a known history Of hypertension, COPD, osteoporosis and glaucoma. The patient had 1 episode of syncope one half months ago. She got her syncope workup with a negative stress test and Holter monitor. The patient passed out for about 5-10 seconds in discharged today. She says she had no presyncope symptoms. But she vomited multiple times after syncope. She also complains of weakness and dizziness after syncope.  PAST MEDICAL HISTORY:   Past Medical History  Diagnosis Date  . Hypertension   . Glaucoma   . COPD (chronic obstructive pulmonary disease) ([place])   . Osteoporosis     PAST SURGICAL HISTORY:   Past Surgical History  Procedure Laterality Date  . Tonsillectomy    . Appendectomy    . Cerebral aneurysm repair      SOCIAL HISTORY:   Social History  Substance Use Topics  . Smoking status: Former Smoker    Quit date: 04/20/2012  . Smokeless tobacco: Not on file  . Alcohol Use: No    FAMILY HISTORY:   Family History  Problem Relation Age of Onset  . CAD Father   . Migraines Father   . CAD Sister   . COPD Mother   . Colon polyps Mother     DRUG ALLERGIES:   Allergies  Allergen Reactions  . Alendronate     Other reaction(s): Abdominal Pain GI upset  . Latex     Other reaction(s): Other (See Comments) blisters  . Lisinopril Swelling    Swollen palate Patient states it swelled up her soft palate really bad.   . Penicillin G Rash  . Risedronate     Other reaction(s): Abdominal Pain GI upset  . Epinephrine     Other reaction(s): Unknown  .  Penicillins Itching    Palms of hands of soles of feet become splotchy and itch.    REVIEW OF SYSTEMS:  CONSTITUTIONAL: No fever,But has dizziness and weakness.  EYES: No blurred or double vision.  EARS, NOSE, AND THROAT: No tinnitus or ear pain.  RESPIRATORY: No cough, shortness of breath, wheezing or hemoptysis.  CARDIOVASCULAR: No chest pain, orthopnea, edema.  GASTROINTESTINAL: Has nausea, vomiting, no diarrhea or abdominal pain. No melena or bloody stool. GENITOURINARY: No dysuria, hematuria.  ENDOCRINE: No polyuria, nocturia,  HEMATOLOGY: No anemia, easy bruising or bleeding SKIN: No rash or lesion. MUSCULOSKELETAL: No joint pain or arthritis.   NEUROLOGIC: No tingling, numbness, weakness. But has syncope. PSYCHIATRY: No anxiety or depression.   MEDICATIONS AT HOME:   Prior to Admission medications   Medication Sig Start Date End Date Taking? Authorizing Provider  albuterol (PROAIR HFA) 108 (90 Base) MCG/ACT inhaler Inhale into the lungs. 09/15/15   Historical Provider, MD  aspirin EC 81 MG tablet Take by mouth.    Historical Provider, MD  Cholecalciferol (VITAMIN D3) 1000 units CAPS Take by mouth.    Historical Provider, MD  cyclobenzaprine (FLEXERIL) 5 MG tablet Take by mouth. 03/14/15   Historical Provider, MD  escitalopram (LEXAPRO) 10 MG tablet  01/02/16   Historical Provider, MD  fluticasone furoate-vilanterol (BREO ELLIPTA)  100-25 MCG/INH AEPB Inhale into the lungs. 06/07/15   Historical Provider, MD  ipratropium-albuterol (DUONEB) 0.5-2.5 (3) MG/3ML SOLN  11/14/15   Historical Provider, MD  irbesartan-hydrochlorothiazide (AVALIDE) 150-12.5 MG tablet Take by mouth. 03/14/15   Historical Provider, MD  loratadine (CLARITIN) 10 MG tablet Take by mouth.    Historical Provider, MD  Melatonin 5 MG TABS Take 1 each by mouth at bedtime as needed.    Historical Provider, MD  Probiotic Product ([place]) Take by mouth.    Historical Provider, MD      VITAL SIGNS:   Blood pressure 121/61, pulse 71, temperature 97.6 F (36.4 C), temperature source Oral, height 5\' 1"  (1.549 m), weight 165 lb (74.844 kg), SpO2 99 %.  PHYSICAL EXAMINATION:  GENERAL:  75 y.o.-year-old patient lying in the bed with no acute distress.  EYES: Pupils equal, round, reactive to light and accommodation. No scleral icterus. Extraocular muscles intact.  HEENT: Head atraumatic, normocephalic. Oropharynx and nasopharynx clear.  NECK:  Supple, no jugular venous distention. No thyroid enlargement, no tenderness.  LUNGS: Normal breath sounds bilaterally, no wheezing, rales,rhonchi or crepitation. No use of accessory muscles of respiration.  CARDIOVASCULAR: S1, S2 normal. No murmurs, rubs, or gallops.  ABDOMEN: Soft, nontender, nondistended. Bowel sounds present. No organomegaly or mass.  EXTREMITIES: No pedal edema, cyanosis, or clubbing.  NEUROLOGIC: Cranial nerves II through XII are intact. Muscle strength 5/5 in all extremities. Sensation intact. Gait not checked.  PSYCHIATRIC: The patient is alert and oriented x 3.  SKIN: No obvious rash, lesion, or ulcer.   LABORATORY PANEL:   CBC  Recent Labs Lab 04/20/16 1247  WBC 7.5  HGB 11.8*  HCT 34.5*  PLT 307   ------------------------------------------------------------------------------------------------------------------  Chemistries   Recent Labs Lab 04/20/16 1247  NA 135  K 3.4*  CL 104  CO2 23  GLUCOSE 106*  BUN 29*  CREATININE 1.00  CALCIUM 8.7*  AST 22  ALT 16  ALKPHOS 58  BILITOT 0.5   ------------------------------------------------------------------------------------------------------------------  Cardiac Enzymes  Recent Labs Lab 04/20/16 1247  TROPONINI <0.03   ------------------------------------------------------------------------------------------------------------------  RADIOLOGY:  Ct Head Wo Contrast  04/20/2016  CLINICAL DATA:  Syncope today. Headache, nausea. History of cerebral  aneurysm repair. EXAM: CT HEAD WITHOUT CONTRAST TECHNIQUE: Contiguous axial images were obtained from the base of the skull through the vertex without intravenous contrast. COMPARISON:  CT 02/28/2014. 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 Prior right temporal craniotomy. No acute intracranial abnormality. Specifically, no hemorrhage, hydrocephalus, mass lesion, acute infarction, or significant intracranial injury. No acute calvarial abnormality. Trauma the layering material noted in the right sphenoid sinus. Mild mucosal thickening in the ethmoid air cells. Mastoid air cells are clear. IMPRESSION: No acute intracranial abnormality. Electronically Signed   By: Rolm Baptise M.D.   On: 04/20/2016 13:55    EKG:   Orders placed or performed during the hospital encounter of 04/20/16  . ED EKG  . ED EKG    IMPRESSION AND PLAN:   Syncope. The patient will be placed for hydration. Continue telemetry monitor, get a echocardiograph and carotid duplex, cardiology consult from Dr. Ubaldo Glassing.  Dehydration. Continue IV fluid support and follow-up BMP. Hold AVALIDE.   Hypokalemia. Give KCl,  follow-up BMP and magnesium level. COPD. Stable.  Hypertension. Controlled. Hold AVALIDE due to dehydration.   All the records are reviewed and case discussed with ED provider. Management plans discussed with the patient, Her 2 daughters and they are in agreement.  CODE STATUS: Full code  TOTAL TIME TAKING CARE OF THIS PATIENT: 50 minutes.    Demetrios Loll M.D on 04/20/2016 at 3:14 PM  Between 7am to 6pm - Pager - [phone]  After 6pm go to [email or web address] - password [email or web address] [facility]  Office  [phone]  CC: Primary care physician; Ezequiel Kayser, MD

## 2016-04-20 NOTE — Progress Notes (Signed)
*  PRELIMINARY RESULTS* Echocardiogram 2D Echocardiogram has been performed.  Jessica Bowman 04/20/2016, 6:45 PM

## 2016-04-21 ENCOUNTER — Observation Stay: Payer: Medicare Other

## 2016-04-21 DIAGNOSIS — E860 Dehydration: Secondary | ICD-10-CM | POA: Diagnosis not present

## 2016-04-21 LAB — BASIC METABOLIC PANEL
Anion gap: 4 — ABNORMAL LOW (ref 5–15)
BUN: 21 mg/dL — AB (ref 6–20)
CALCIUM: 8.4 mg/dL — AB (ref 8.9–10.3)
CO2: 21 mmol/L — AB (ref 22–32)
CREATININE: 0.93 mg/dL (ref 0.44–1.00)
Chloride: 112 mmol/L — ABNORMAL HIGH (ref 101–111)
GFR calc non Af Amer: 59 mL/min — ABNORMAL LOW (ref >60)
GLUCOSE: 83 mg/dL (ref 65–99)
Potassium: 4.4 mmol/L (ref 3.5–5.1)
Sodium: 137 mmol/L (ref 135–145)

## 2016-04-21 LAB — ECHOCARDIOGRAM COMPLETE
Height: 61 in
Weight: 1881.6 oz

## 2016-04-21 MED ORDER — IPRATROPIUM-ALBUTEROL 0.5-2.5 (3) MG/3ML IN SOLN
3.0000 mL | Freq: Two times a day (BID) | RESPIRATORY_TRACT | Status: DC | PRN
  Administered 2016-04-21: 3 mL via RESPIRATORY_TRACT
  Filled 2016-04-21: qty 3

## 2016-04-21 NOTE — Consult Note (Signed)
[facility] Consultation Note  Patient ID: Jessica Bowman, MRN: [id number], DOB/AGE: 1941/07/31 75 y.o. Admit date: 04/20/2016   Date of Consult: 04/21/2016 Primary Physician: Ezequiel Kayser, MD Primary Cardiologist:Fath  Chief Complaint: No chief complaint on file.  Reason for Consult: syncope  HPI: 75 y.o. female with known essential hypertension chronic obstructive pulmonary disease who had an episode of micturition syncope last month. At that time the patient did herself and banged her head and knee and otherwise felt fine. She has not had any other episodes since and has had a Holter monitor and a stress test for which she performed well with no evidence of issues. She was in church when she has had an other episode of syncope occurring for approximately 15 seconds for which she did not herself. The patient was weak at that time. The patient did recover and had no other episodes of syncope or other concerns when brought to the hospital. EKG shows normal sinus rhythm. There is been no evidence of telemetry changes. The patient has normal troponin and no evidence of myocardial infarction. Previous echocardiogram has shown normal LV systolic function. The patient has been ambulating well without further significant symptoms  Past Medical History  Diagnosis Date  . Hypertension   . Glaucoma   . COPD (chronic obstructive pulmonary disease) ([place])   . Osteoporosis       Surgical History:  Past Surgical History  Procedure Laterality Date  . Tonsillectomy    . Appendectomy    . Cerebral aneurysm repair       Home Meds: Prior to Admission medications   Medication Sig Start Date End Date Taking? Authorizing Provider  albuterol (PROAIR HFA) 108 (90 Base) MCG/ACT inhaler Inhale 2 puffs into the lungs See admin instructions. Inhale 2 puffs every 4 to 6 hours as needed for shortness of breath/wheezing. 09/15/15  Yes Historical Provider, MD  aspirin EC 81 MG tablet Take 81 mg by mouth  at bedtime.    Yes Historical Provider, MD  Cholecalciferol (VITAMIN D3) 1000 units CAPS Take 1,000 Units by mouth daily.    Yes Historical Provider, MD  cyclobenzaprine (FLEXERIL) 5 MG tablet Take 5 mg by mouth daily as needed for muscle spasms.  03/14/15  Yes Historical Provider, MD  escitalopram (LEXAPRO) 10 MG tablet Take 5-10 mg by mouth See admin instructions. Take 1/2 tablet  (5 mg) by mouth every morning, and take 1 tablet by mouth every night at bedtime. 01/02/16  Yes Historical Provider, MD  fluticasone furoate-vilanterol (BREO ELLIPTA) 100-25 MCG/INH AEPB Inhale 1 puff into the lungs daily.  06/07/15  Yes Historical Provider, MD  ipratropium-albuterol (DUONEB) 0.5-2.5 (3) MG/3ML SOLN Take 3 mLs by nebulization 2 (two) times daily as needed. For shortness of breath/wheezing. 11/14/15  Yes Historical Provider, MD  irbesartan-hydrochlorothiazide (AVALIDE) 150-12.5 MG tablet Take 0.5 tablets by mouth daily.  03/14/15  Yes Historical Provider, MD  LUMIGAN 0.01 % SOLN Place 1 drop into both eyes at bedtime. 03/30/16  Yes Historical Provider, MD  Melatonin 5 MG TABS Take 5 mg by mouth at bedtime as needed. For sleep.   Yes Historical Provider, MD  Multiple Vitamins-Minerals [facility] ADVANCED) TABS Take 1 tablet by mouth daily.   Yes Historical Provider, MD  Probiotic Product ([place]) Take 1 capsule by mouth daily.    Yes Historical Provider, MD    Inpatient Medications:  . aspirin EC  81 mg Oral QHS  . enoxaparin (LOVENOX) injection  40 mg  Subcutaneous Q24H  . escitalopram  10 mg Oral QHS  . escitalopram  5 mg Oral Daily  . fluticasone furoate-vilanterol  1 puff Inhalation Daily      Allergies:  Allergies  Allergen Reactions  . Alendronate     Other reaction(s): Abdominal Pain GI upset  . Latex     Other reaction(s): Other (See Comments) blisters  . Lisinopril Swelling    Swollen palate Patient states it swelled up her soft palate really bad.   . Penicillin G  Rash  . Risedronate     Other reaction(s): Abdominal Pain GI upset  . Epinephrine     Other reaction(s): Unknown  . Penicillins Itching    Palms of hands of soles of feet become splotchy and itch. Has patient had a PCN reaction causing immediate rash, facial/tongue/throat swelling, SOB or lightheadedness with hypotension: Yes Has patient had a PCN reaction causing severe rash involving mucus membranes or skin necrosis: No Has patient had a PCN reaction that required hospitalization No Has patient had a PCN reaction occurring within the last 10 years: No If all of the above answers are "NO", then may proceed with Cephalosporin use.    Social History   Social History  . Marital Status: Married    Spouse Name: N/A  . Number of Children: N/A  . Years of Education: N/A   Occupational History  . Not on file.   Social History Main Topics  . Smoking status: Former Smoker    Quit date: 04/20/2012  . Smokeless tobacco: Not on file  . Alcohol Use: No  . Drug Use: No  . Sexual Activity: Not on file   Other Topics Concern  . Not on file   Social History Narrative     Family History  Problem Relation Age of Onset  . CAD Father   . Migraines Father   . CAD Sister   . COPD Mother   . Colon polyps Mother      Review of Systems Positive forWeakness fatigue syncope Negative for: General:  chills, fever, night sweats or weight changes.  Cardiovascular: PND orthopnea positive for syncope dizziness  Dermatological skin lesions rashes Respiratory: Cough congestion Urologic: Frequent urination urination at night and hematuria Abdominal: negative for nausea, vomiting, diarrhea, bright red blood per rectum, melena, or hematemesis Neurologic: negative for visual changes, and/or hearing changes  All other systems reviewed and are otherwise negative except as noted above.  Labs:  Recent Labs  04/20/16 1247  TROPONINI <0.03   Lab Results  Component Value Date   WBC 7.5  04/20/2016   HGB 11.8* 04/20/2016   HCT 34.5* 04/20/2016   MCV 88.7 04/20/2016   PLT 307 04/20/2016    Recent Labs Lab 04/20/16 1247 04/21/16 0552  NA 135 137  K 3.4* 4.4  CL 104 112*  CO2 23 21*  BUN 29* 21*  CREATININE 1.00 0.93  CALCIUM 8.7* 8.4*  PROT 6.4*  --   BILITOT 0.5  --   ALKPHOS 58  --   ALT 16  --   AST 22  --   GLUCOSE 106* 83   No results found for: CHOL, HDL, LDLCALC, TRIG No results found for: DDIMER  Radiology/Studies:  Ct Head Wo Contrast  04/20/2016  CLINICAL DATA:  Syncope today. Headache, nausea. History of cerebral aneurysm repair. EXAM: CT HEAD WITHOUT CONTRAST TECHNIQUE: Contiguous axial images were obtained from the base of the skull through the vertex without intravenous contrast. COMPARISON:  CT 02/28/2014. FINDINGS:  Prior aneurysm repair in the region of the right MCA. Prior right temporal craniotomy. No acute intracranial abnormality. Specifically, no hemorrhage, hydrocephalus, mass lesion, acute infarction, or significant intracranial injury. No acute calvarial abnormality. Trauma the layering material noted in the right sphenoid sinus. Mild mucosal thickening in the ethmoid air cells. Mastoid air cells are clear. IMPRESSION: No acute intracranial abnormality. Electronically Signed   By: Rolm Baptise M.D.   On: 04/20/2016 13:55   US Carotid Bilateral  04/21/2016  CLINICAL DATA:  Syncope, hypertension, prior smoker EXAM: BILATERAL CAROTID DUPLEX ULTRASOUND TECHNIQUE: Pearline Cables scale imaging, color Doppler and duplex ultrasound were performed of bilateral carotid and vertebral arteries in the neck. COMPARISON:  None. FINDINGS: Criteria: Quantification of carotid stenosis is based on velocity parameters that correlate the residual internal carotid diameter with NASCET-based stenosis levels, using the diameter of the distal internal carotid lumen as the denominator for stenosis measurement. The following velocity measurements were obtained: RIGHT ICA:  91/27  cm/sec CCA:  [id number] cm/sec SYSTOLIC ICA/CCA RATIO:  1.1 DIASTOLIC ICA/CCA RATIO:  1.5 ECA:  70 cm/sec LEFT ICA:  79/20 cm/sec CCA:  [id number] cm/sec SYSTOLIC ICA/CCA RATIO:  1.0 DIASTOLIC ICA/CCA RATIO:  0.9 ECA:  99 cm/sec RIGHT CAROTID ARTERY: Minor echogenic shadowing plaque formation. No hemodynamically significant right ICA stenosis, velocity elevation, or turbulent flow. Degree of narrowing less than 50%. RIGHT VERTEBRAL ARTERY:  Antegrade LEFT CAROTID ARTERY: Similar scattered minor echogenic plaque formation. No hemodynamically significant left ICA stenosis, velocity elevation, or turbulent flow. LEFT VERTEBRAL ARTERY:  Antegrade IMPRESSION: Minor carotid atherosclerosis. No hemodynamically significant ICA stenosis. Degree of narrowing less than 50% bilaterally. Patent antegrade vertebral flow bilaterally. Electronically Signed   By: Jerilynn Mages.  Shick M.D.   On: 04/21/2016 11:31    EKG: Normal sinus rhythm  Weights: Filed Weights   04/20/16 1257 04/20/16 1806  Weight: 165 lb (74.844 kg) 117 lb 9.6 oz (53.343 kg)     Physical Exam: Blood pressure 148/75, pulse 72, temperature 98.1 F (36.7 C), temperature source Oral, resp. rate 18, height 5\' 1"  (1.549 m), weight 117 lb 9.6 oz (53.343 kg), SpO2 99 %. Body mass index is 22.23 kg/(m^2). General: Well developed, well nourished, in no acute distress. Head eyes ears nose throat: Normocephalic, atraumatic, sclera non-icteric, no xanthomas, nares are without discharge. No apparent thyromegaly and/or mass  Lungs: Normal respiratory effort.  no wheezes, no rales, no rhonchi.  Heart: RRR with normal S1 S2. no murmur gallop, no rub, PMI is normal size and placement, carotid upstroke normal without bruit, jugular venous pressure is normal Abdomen: Soft, non-tender, non-distended with normoactive bowel sounds. No hepatomegaly. No rebound/guarding. No obvious abdominal masses. Abdominal aorta is normal size without bruit Extremities: No edema. no cyanosis, no  clubbing, no ulcers  Peripheral : 2+ bilateral upper extremity pulses, 2+ bilateral femoral pulses, 2+ bilateral dorsal pedal pulse Neuro: Alert and oriented. No facial asymmetry. No focal deficit. Moves all extremities spontaneously. Musculoskeletal: Normal muscle tone without kyphosis Psych:  Responds to questions appropriately with a normal affect.    Assessment: 75 year old female with essential hypertension chronic obstructive pulmonary disease with 2 episodes of syncope atypical in nature with normal Holter monitor stress test echocardiogram and no evidence of critical disease by carotid Doppler needing further treatment options  Plan: 1. No further cardiac workup at this time in hospital 2. Okay for discharge to home due to no evidence of myocardial infarction or other rhythm disturbances by telemetry 3. 30 day Holter monitor to assess for  rhythm disturbances and recurrent syncope 4. No restrictions to physical activity  Signed, Corey Skains M.D. [facility] 04/21/2016, 2:52 PM

## 2016-04-21 NOTE — Discharge Instructions (Signed)

## 2016-04-21 NOTE — Progress Notes (Signed)
Patient received discharge instructions, pt verbalized understanding. IV was removed with no signs of infection. Dressing clean, dry intact. No skin tears or wounds present. Holter monitor applied. Patient educated by Lorie Apley on where to return monitor. Patient was escorted out with staff member via wheelchair via private auto. No further needs from care management team.

## 2016-04-21 NOTE — Progress Notes (Signed)
Patient stated she takes duoneb nebs every morning for wheezing along with inhaler. Notified Dr. Darvin Neighbours, per MD okay to order duoneb BID PRN. Will continue to monitor. Horton Finer

## 2016-04-21 NOTE — Progress Notes (Signed)
Placed holter monitor on patient and explained return date and time.  Patient understands.

## 2016-04-23 ENCOUNTER — Telehealth: Payer: Self-pay | Admitting: Respiratory Therapy

## 2016-04-23 NOTE — Telephone Encounter (Signed)
Jessica Bowman called and possibly will be out several sessions. She has worn a heart monitor and may possibly have a heart cath. She will return to [place] as soon as she is cleared for exercise.

## 2016-04-24 NOTE — Discharge Summary (Signed)
[facility] [place] NAME: Jessica Bowman    MR#:  [id number]  DATE OF BIRTH:  1941-04-30  DATE OF ADMISSION:  04/20/2016 ADMITTING PHYSICIAN: Demetrios Loll, MD  DATE OF DISCHARGE: 04/21/2016  5:01 PM  PRIMARY CARE PHYSICIAN: Ezequiel Kayser, MD   ADMISSION DIAGNOSIS:  Syncope and collapse [R55]  DISCHARGE DIAGNOSIS:  Principal Problem:   Syncope   SECONDARY DIAGNOSIS:   Past Medical History  Diagnosis Date  . Hypertension   . Glaucoma   . COPD (chronic obstructive pulmonary disease) ([place])   . Osteoporosis      ADMITTING HISTORY  Jessica Bowman is a 75 y.o. female with a known history Of hypertension, COPD, osteoporosis and glaucoma. The patient had 1 episode of syncope one half months ago. She got her syncope workup with a negative stress test and Holter monitor. The patient passed out for about 5-10 seconds in discharged today. She says she had no presyncope symptoms. But she vomited multiple times after syncope. She also complains of weakness and dizziness after syncope.  HOSPITAL COURSE:   * Syncope likely from dehydration Troponin normal. Echo normal. Seen by cardiology and holter set up at discharge. Feels back to baseline after IVF. F/u with PCP and cardiology as OP  CONSULTS OBTAINED:  Treatment Team:  Corey Skains, MD  DRUG ALLERGIES:   Allergies  Allergen Reactions  . Alendronate     Other reaction(s): Abdominal Pain GI upset  . Latex     Other reaction(s): Other (See Comments) blisters  . Lisinopril Swelling    Swollen palate Patient states it swelled up her soft palate really bad.   . Penicillin G Rash  . Risedronate     Other reaction(s): Abdominal Pain GI upset  . Epinephrine     Other reaction(s): Unknown  . Penicillins Itching    Palms of hands of soles of feet become splotchy and itch. Has patient had a PCN reaction causing immediate rash, facial/tongue/throat swelling, SOB or lightheadedness  with hypotension: Yes Has patient had a PCN reaction causing severe rash involving mucus membranes or skin necrosis: No Has patient had a PCN reaction that required hospitalization No Has patient had a PCN reaction occurring within the last 10 years: No If all of the above answers are "NO", then may proceed with Cephalosporin use.    DISCHARGE MEDICATIONS:   Discharge Medication List as of 04/21/2016  3:43 PM    CONTINUE these medications which have NOT CHANGED   Details  albuterol (PROAIR HFA) 108 (90 Base) MCG/ACT inhaler Inhale 2 puffs into the lungs See admin instructions. Inhale 2 puffs every 4 to 6 hours as needed for shortness of breath/wheezing., Starting 09/15/2015, Until Discontinued, Historical Med    aspirin EC 81 MG tablet Take 81 mg by mouth at bedtime. , Until Discontinued, Historical Med    Cholecalciferol (VITAMIN D3) 1000 units CAPS Take 1,000 Units by mouth daily. , Until Discontinued, Historical Med    cyclobenzaprine (FLEXERIL) 5 MG tablet Take 5 mg by mouth daily as needed for muscle spasms. , Starting 03/14/2015, Until Discontinued, Historical Med    escitalopram (LEXAPRO) 10 MG tablet Take 5-10 mg by mouth See admin instructions. Take 1/2 tablet  (5 mg) by mouth every morning, and take 1 tablet by mouth every night at bedtime., Starting 01/02/2016, Until Discontinued, Historical Med    fluticasone furoate-vilanterol (BREO ELLIPTA) 100-25 MCG/INH AEPB Inhale 1 puff into the lungs daily. , Starting 06/07/2015,  Until Discontinued, Historical Med    ipratropium-albuterol (DUONEB) 0.5-2.5 (3) MG/3ML SOLN Take 3 mLs by nebulization 2 (two) times daily as needed. For shortness of breath/wheezing., Starting 11/14/2015, Until Discontinued, Historical Med    irbesartan-hydrochlorothiazide (AVALIDE) 150-12.5 MG tablet Take 0.5 tablets by mouth daily. , Starting 03/14/2015, Until Discontinued, Historical Med    LUMIGAN 0.01 % SOLN Place 1 drop into both eyes at bedtime., Starting  03/30/2016, Until Discontinued, Historical Med    Melatonin 5 MG TABS Take 5 mg by mouth at bedtime as needed. For sleep., Until Discontinued, Historical Med    Multiple Vitamins-Minerals ([employer] ADVANCED) TABS Take 1 tablet by mouth daily., Until Discontinued, Historical Med    Probiotic Product ([facility] PO) Take 1 capsule by mouth daily. , Until Discontinued, Historical Med        Today   VITAL SIGNS:  Blood pressure 148/75, pulse 72, temperature 98.1 F (36.7 C), temperature source Oral, resp. rate 18, height 5\' 1"  (1.549 m), weight 53.343 kg (117 lb 9.6 oz), SpO2 99 %.  I/O:  No intake or output data in the 24 hours ending 04/24/16 1952  PHYSICAL EXAMINATION:  Physical Exam  GENERAL:  75 y.o.-year-old patient lying in the bed with no acute distress.  LUNGS: Normal breath sounds bilaterally, no wheezing, rales,rhonchi or crepitation. No use of accessory muscles of respiration.  CARDIOVASCULAR: S1, S2 normal. No murmurs, rubs, or gallops.  ABDOMEN: Soft, non-tender, non-distended. Bowel sounds present. No organomegaly or mass.  NEUROLOGIC: Moves all 4 extremities. PSYCHIATRIC: The patient is alert and oriented x 3.  SKIN: No obvious rash, lesion, or ulcer.   DATA REVIEW:   CBC  Recent Labs Lab 04/20/16 1247  WBC 7.5  HGB 11.8*  HCT 34.5*  PLT 307    Chemistries   Recent Labs Lab 04/20/16 1247 04/21/16 0552  NA 135 137  K 3.4* 4.4  CL 104 112*  CO2 23 21*  GLUCOSE 106* 83  BUN 29* 21*  CREATININE 1.00 0.93  CALCIUM 8.7* 8.4*  MG 1.8  --   AST 22  --   ALT 16  --   ALKPHOS 58  --   BILITOT 0.5  --     Cardiac Enzymes  Recent Labs Lab 04/20/16 1247  TROPONINI <0.03    Microbiology Results  No results found for this or any previous visit.  RADIOLOGY:  No results found.  Follow up with PCP in 1 week.  Management plans discussed with the patient, family and they are in agreement.  CODE STATUS:  Code Status History     Date Active Date Inactive Code Status Order ID Comments User Context   04/20/2016  4:59 PM 04/21/2016  8:02 PM Full Code [id number]  Demetrios Loll, MD Inpatient    Advance Directive Documentation        Most Recent Value   Type of Advance Directive  Healthcare Power of Attorney   Pre-existing out of facility DNR order (yellow form or pink MOST form)     "MOST" Form in Place?        TOTAL TIME TAKING CARE OF THIS PATIENT ON DAY OF DISCHARGE: more than 30 minutes.   Hillary Bow R M.D on 04/24/2016 at 7:52 PM  Between 7am to 6pm - Pager - [phone]  After 6pm go to [email or web address] - password [email or web address] [facility]  Office  [phone]  CC: Primary care physician; Ezequiel Kayser, MD  Note: This dictation was prepared with Dragon  dictation along with smaller phrase technology. Any transcriptional errors that result from this process are unintentional.

## 2016-04-29 ENCOUNTER — Encounter: Payer: Self-pay | Admitting: *Deleted

## 2016-04-29 DIAGNOSIS — J449 Chronic obstructive pulmonary disease, unspecified: Secondary | ICD-10-CM

## 2016-04-29 NOTE — Progress Notes (Signed)
Pulmonary Individual Treatment Plan  Patient Details  Name: Jessica Bowman MRN: [id number] Date of Birth: December 08, 1940 Referring Provider:        Documentation from 04/29/2016 in [facility] Cardiac and Pulmonary Rehab   Referring Provider  Dr. Raul Del      Initial Encounter Date:       Documentation from 04/29/2016 in [facility] Cardiac and Pulmonary Rehab   Date  01/09/16   Referring Provider  Dr. Raul Del      Visit Diagnosis: Moderate COPD (chronic obstructive pulmonary disease) ([place])  COPD, moderate ([place])  Patient's Home Medications on Admission:  Current outpatient prescriptions:  .  albuterol (PROAIR HFA) 108 (90 Base) MCG/ACT inhaler, Inhale 2 puffs into the lungs See admin instructions. Inhale 2 puffs every 4 to 6 hours as needed for shortness of breath/wheezing., Disp: , Rfl:  .  aspirin EC 81 MG tablet, Take 81 mg by mouth at bedtime. , Disp: , Rfl:  .  Cholecalciferol (VITAMIN D3) 1000 units CAPS, Take 1,000 Units by mouth daily. , Disp: , Rfl:  .  cyclobenzaprine (FLEXERIL) 5 MG tablet, Take 5 mg by mouth daily as needed for muscle spasms. , Disp: , Rfl:  .  escitalopram (LEXAPRO) 10 MG tablet, Take 5-10 mg by mouth See admin instructions. Take 1/2 tablet  (5 mg) by mouth every morning, and take 1 tablet by mouth every night at bedtime., Disp: , Rfl:  .  fluticasone furoate-vilanterol (BREO ELLIPTA) 100-25 MCG/INH AEPB, Inhale 1 puff into the lungs daily. , Disp: , Rfl:  .  ipratropium-albuterol (DUONEB) 0.5-2.5 (3) MG/3ML SOLN, Take 3 mLs by nebulization 2 (two) times daily as needed. For shortness of breath/wheezing., Disp: , Rfl:  .  irbesartan-hydrochlorothiazide (AVALIDE) 150-12.5 MG tablet, Take 0.5 tablets by mouth daily. , Disp: , Rfl:  .  LUMIGAN 0.01 % SOLN, Place 1 drop into both eyes at bedtime., Disp: , Rfl:  .  Melatonin 5 MG TABS, Take 5 mg by mouth at bedtime as needed. For sleep., Disp: , Rfl:  .  Multiple Vitamins-Minerals (STRESSTABS ADVANCED) TABS, Take 1 tablet by  mouth daily., Disp: , Rfl:  .  Probiotic Product (PHILLIPS COLON HEALTH PO), Take 1 capsule by mouth daily. , Disp: , Rfl:   Past Medical History: Past Medical History  Diagnosis Date  . Hypertension   . Glaucoma   . COPD (chronic obstructive pulmonary disease) ([place])   . Osteoporosis     Tobacco Use: History  Smoking status  . Former Smoker  . Quit date: 04/20/2012  Smokeless tobacco  . Not on file    Labs: Recent Review Flowsheet Data    There is no flowsheet data to display.       ADL UCSD:     Pulmonary Assessment Scores      01/09/16 0900       ADL UCSD   ADL Phase Entry     SOB Score total 50     Rest 0     Walk 3     Stairs 3     Bath 1     Dress 1     Shop 4        Pulmonary Function Assessment:     Pulmonary Function Assessment - 01/09/16 0900    Initial Spirometry Results   FVC% 81 %   FEV1% 59 %   FEV1/FVC Ratio 56.77   Post Bronchodilator Spirometry Results   FVC% 84 %   FEV1% 67 %   FEV1/FVC  Ratio 59.69   Breath   Bilateral Breath Sounds Decreased;Clear   Shortness of Breath Yes;Fear of Shortness of Breath;Limiting activity      Exercise Target Goals: Date: 01/09/16  Exercise Program Goal: Individual exercise prescription set with THRR, safety & activity barriers. Participant demonstrates ability to understand and report RPE using BORG scale, to self-measure pulse accurately, and to acknowledge the importance of the exercise prescription.  Exercise Prescription Goal: Starting with aerobic activity 30 plus minutes a day, 3 days per week for initial exercise prescription. Provide home exercise prescription and guidelines that participant acknowledges understanding prior to discharge.  Activity Barriers & Risk Stratification:     Activity Barriers & Cardiac Risk Stratification - 01/09/16 0900    Activity Barriers & Cardiac Risk Stratification   Activity Barriers Shortness of Breath   Cardiac Risk Stratification Low      6  Minute Walk:     6 Minute Walk      01/09/16 1306       6 Minute Walk   Phase Initial     Distance 1155 feet     Walk Time 6 minutes     # of Rest Breaks 0     RPE 13     Perceived Dyspnea  3     Symptoms No     Resting HR 102 bpm     Resting BP 118/74 mmHg     Max Ex. HR 120 bpm     Max Ex. BP 138/74 mmHg        Initial Exercise Prescription:     Initial Exercise Prescription - 04/29/16 1400    Date of Initial Exercise RX and Referring Provider   Date 01/09/16   Referring Provider Dr. Raul Del      Perform Capillary Blood Glucose checks as needed.  Exercise Prescription Changes:     Exercise Prescription Changes      01/24/16 1500 03/15/16 1300 03/29/16 1200 04/05/16 1200 04/19/16 1130   Response to Exercise   Blood Pressure (Admit) 122/72 mmHg    142/70 mmHg   Blood Pressure (Exercise) 140/80 mmHg    122/60 mmHg   Blood Pressure (Exit) 122/60 mmHg    120/60 mmHg   Heart Rate (Admit) 106 bpm    115 bpm   Heart Rate (Exercise) 113 bpm    118 bpm   Heart Rate (Exit) 104 bpm    97 bpm   Oxygen Saturation (Admit) 97 %    97 %   Oxygen Saturation (Exercise) 90 %    95 %   Oxygen Saturation (Exit) 97 %    97 %   Rating of Perceived Exertion (Exercise) 13    13   Perceived Dyspnea (Exercise) 3    4   Symptoms none    mild headache   Comments Jessica Bowman's first day of exercise in [facility]. Met goals and tolerated exercise well.       Duration Progress to 50 minutes of aerobic without signs/symptoms of physical distress       Intensity Rest + 30    THRR New  119-137   Progression   Progression  Continue progressive overload as per policy without signs/symptoms or physical distress. Continue progressive overload as per policy without signs/symptoms or physical distress. Continue progressive overload as per policy without signs/symptoms or physical distress. Continue to progress workloads to maintain intensity without signs/symptoms of physical distress.   Resistance  Training   Training Prescription No   Yes Yes  Weight    3 3   Reps    10-12 10-12   Treadmill   MPH 1.2 1.2 1.2 1.3    Grade 0 0 0 0    Minutes '11 12 12 15    ' NuStep   Level   2 2    Watts   40 25    Minutes   15 15    T5 Nustep   Level 1       Watts 15       Minutes 10       Biostep-RELP   Level '2 2 2 2    ' Watts 40 '20 20 20    ' Minutes '10 10 15 15    ' Home Exercise Plan   Frequency Add 2 additional days to program exercise sessions.          Exercise Comments:     Exercise Comments      04/24/16 1212           Exercise Comments Jessica Netzer has been on a heart monitor and may have a cath per phone call to Laureen - she will return when cleared to exercise and exercise will be modified if needed.          Discharge Exercise Prescription (Final Exercise Prescription Changes):     Exercise Prescription Changes - 04/19/16 1130    Response to Exercise   Blood Pressure (Admit) 142/70 mmHg   Blood Pressure (Exercise) 122/60 mmHg   Blood Pressure (Exit) 120/60 mmHg   Heart Rate (Admit) 115 bpm   Heart Rate (Exercise) 118 bpm   Heart Rate (Exit) 97 bpm   Oxygen Saturation (Admit) 97 %   Oxygen Saturation (Exercise) 95 %   Oxygen Saturation (Exit) 97 %   Rating of Perceived Exertion (Exercise) 13   Perceived Dyspnea (Exercise) 4   Symptoms mild headache   Intensity THRR New  119-137   Progression   Progression Continue to progress workloads to maintain intensity without signs/symptoms of physical distress.   Resistance Training   Training Prescription Yes   Weight 3   Reps 10-12       Nutrition:  Target Goals: Understanding of nutrition guidelines, daily intake of sodium <1517m, cholesterol <2042m calories 30% from fat and 7% or less from saturated fats, daily to have 5 or more servings of fruits and vegetables.  Biometrics:     Pre Biometrics - 01/10/16 1105    Pre Biometrics   Height '5\' 1"'  (1.549 m)   Weight 115 lb 12.8 oz (52.527 kg)   Waist  Circumference 30.5 inches   Hip Circumference 36 inches   Waist to Hip Ratio 0.85 %   BMI (Calculated) 21.9       Nutrition Therapy Plan and Nutrition Goals:     Nutrition Therapy & Goals - 01/09/16 0900    Nutrition Therapy   Diet Jessica JoStopkaoes not want to meet with the dietitian. She does her own cooking. She has maintained a weight of 110 to 115lbs her whole life. Jessica JoWernlioes drink over 8 glasses of fluid, mainly water.      Nutrition Discharge: Rate Your Plate Scores:   Psychosocial: Target Goals: Acknowledge presence or absence of depression, maximize coping skills, provide positive support system. Participant is able to verbalize types and ability to use techniques and skills needed for reducing stress and depression.  Initial Review & Psychosocial Screening:     Initial Psych Review & Screening - 01/09/16 0900  Initial Review   Current issues with Current Depression   Family Dynamics   Good Support System? Yes   Comments Jessica Timberlake is dealing with the lose of her husband in 12/16. She does have good support from her 2 daughters that live close to her. She is learning about COPD and is coping very well with the disease.   Screening Interventions   Interventions Encouraged to exercise;Program counselor consult      Quality of Life Scores:     Quality of Life - 01/09/16 0900    Quality of Life Scores   Health/Function Pre 19.6 %   Socioeconomic Pre 27 %   Psych/Spiritual Pre 22.29 %   Family Pre 19.5 %   GLOBAL Pre 21.56 %      PHQ-9:     Recent Review Flowsheet Data    Depression screen [facility] 2/9 01/09/2016   Decreased Interest 2   Down, Depressed, Hopeless 1   PHQ - 2 Score 3   Altered sleeping 2   Tired, decreased energy 2   Change in appetite 1   Feeling bad or failure about yourself  0   Trouble concentrating 0   Moving slowly or fidgety/restless 0   Suicidal thoughts 0   PHQ-9 Score 8   Difficult doing work/chores Not difficult at all       Psychosocial Evaluation and Intervention:     Psychosocial Evaluation - 04/08/16 1249    Psychosocial Evaluation & Interventions   Interventions Encouraged to exercise with the program and follow exercise prescription;Relaxation education;Stress management education   Comments Counselor met with Jessica. Jewel today for initial psychosocial evalution.  She is a 75 yr old who was diagnosed with COPD in 2014.  Jessica. Niemeier lives alone since the death of her spouse this past 03-Nov-2023 from lung cancer.  She became very sedentary during that time and her health issues increased as a result.  She has multiple health issues including vision & digestive  problems as well as anxiety .  She reports taking medications for all of these - including the anxiety and that they are currently being effective.  Jessica. Bonds states she has trouble getting to sleep, but sleeps at least 7-8 hours per night.  She states this program has already improved her sleep slightly and increased her ability to do normal activities.  Jessica. Lien is typically in a positive mood and continues to have anxiety in new situations primarily.  She has an appointment with a neurologist in the next few weeks to assess why she fainted twice over the past several months.  Jessica. Hauter has goals to be more fit and more active.  She plans to look into follow up programs to maintain her progress upon completion of this program.    Continued Psychosocial Services Needed Yes  Jessica. Sabado will benefit from the psychoeducational components of this program, especially anxiety and relaxation.        Psychosocial Re-Evaluation:  Education: Education Goals: Education classes will be provided on a weekly basis, covering required topics. Participant will state understanding/return demonstration of topics presented.  Learning Barriers/Preferences:     Learning Barriers/Preferences - 01/09/16 0900    Learning Barriers/Preferences   Learning Barriers  None   Learning Preferences Group Instruction;Individual Instruction;Pictoral;Skilled Demonstration;Verbal Instruction;Video;Written Material      Education Topics: Initial Evaluation Education: - Verbal, written and demonstration of respiratory meds, RPE/PD scales, oximetry and breathing techniques. Instruction on use of nebulizers and MDIs: cleaning and proper use,  rinsing mouth with steroid doses and importance of monitoring MDI activations.          Pulmonary Rehab from 04/17/2016 in [facility] Cardiac and Pulmonary Rehab   Date  01/09/16   Educator  LB   Instruction Review Code  2- meets goals/outcomes      General Nutrition Guidelines/Fats and Fiber: -Group instruction provided by verbal, written material, models and posters to present the general guidelines for heart healthy nutrition. Gives an explanation and review of dietary fats and fiber.   Controlling Sodium/Reading Food Labels: -Group verbal and written material supporting the discussion of sodium use in heart healthy nutrition. Review and explanation with models, verbal and written materials for utilization of the food label.   Exercise Physiology & Risk Factors: - Group verbal and written instruction with models to review the exercise physiology of the cardiovascular system and associated critical values. Details cardiovascular disease risk factors and the goals associated with each risk factor.   Aerobic Exercise & Resistance Training: - Gives group verbal and written discussion on the health impact of inactivity. On the components of aerobic and resistive training programs and the benefits of this training and how to safely progress through these programs.   Flexibility, Balance, General Exercise Guidelines: - Provides group verbal and written instruction on the benefits of flexibility and balance training programs. Provides general exercise guidelines with specific guidelines to those with heart or lung disease.  Demonstration and skill practice provided.      Pulmonary Rehab from 04/17/2016 in [facility] Cardiac and Pulmonary Rehab   Date  03/27/16   Educator  Salley Hews, PT`   Instruction Review Code  2- meets goals/outcomes      Stress Management: - Provides group verbal and written instruction about the health risks of elevated stress, cause of high stress, and healthy ways to reduce stress.   Depression: - Provides group verbal and written instruction on the correlation between heart/lung disease and depressed mood, treatment options, and the stigmas associated with seeking treatment.   Exercise & Equipment Safety: - Individual verbal instruction and demonstration of equipment use and safety with use of the equipment.   Infection Prevention: - Provides verbal and written material to individual with discussion of infection control including proper hand washing and proper equipment cleaning during exercise session.   Falls Prevention: - Provides verbal and written material to individual with discussion of falls prevention and safety.      Pulmonary Rehab from 04/17/2016 in [facility] Cardiac and Pulmonary Rehab   Date  01/09/16   Educator  LB   Instruction Review Code  2- meets goals/outcomes      Diabetes: - Individual verbal and written instruction to review signs/symptoms of diabetes, desired ranges of glucose level fasting, after meals and with exercise. Advice that pre and post exercise glucose checks will be done for 3 sessions at entry of program.   Chronic Lung Diseases: - Group verbal and written instruction to review new updates, new respiratory medications, new advancements in procedures and treatments. Provide informative websites and "800" numbers of self-education.   Lung Procedures: - Group verbal and written instruction to describe testing methods done to diagnose lung disease. Review the outcome of test results. Describe the treatment choices: Pulmonary Function Tests, ABGs  and oximetry.      Pulmonary Rehab from 04/17/2016 in [facility] Cardiac and Pulmonary Rehab   Date  04/12/16   Educator  [place]   Instruction Review Code  2- meets goals/outcomes  Energy Conservation: - Provide group verbal and written instruction for methods to conserve energy, plan and organize activities. Instruct on pacing techniques, use of adaptive equipment and posture/positioning to relieve shortness of breath.   Triggers: - Group verbal and written instruction to review types of environmental controls: home humidity, furnaces, filters, dust mite/pet prevention, HEPA vacuums. To discuss weather changes, air quality and the benefits of nasal washing.   Exacerbations: - Group verbal and written instruction to provide: warning signs, infection symptoms, calling MD promptly, preventive modes, and value of vaccinations. Review: effective airway clearance, coughing and/or vibration techniques. Create an Sports administrator.   Oxygen: - Individual and group verbal and written instruction on oxygen therapy. Includes supplement oxygen, available portable oxygen systems, continuous and intermittent flow rates, oxygen safety, concentrators, and Medicare reimbursement for oxygen.   Respiratory Medications: - Group verbal and written instruction to review medications for lung disease. Drug class, frequency, complications, importance of spacers, rinsing mouth after steroid MDI's, and proper cleaning methods for nebulizers.      Pulmonary Rehab from 04/17/2016 in [facility] Cardiac and Pulmonary Rehab   Date  01/09/16   Educator  LB   Instruction Review Code  2- meets goals/outcomes      AED/CPR: - Group verbal and written instruction with the use of models to demonstrate the basic use of the AED with the basic ABC's of resuscitation.      Pulmonary Rehab from 04/17/2016 in [facility] Cardiac and Pulmonary Rehab   Date  04/05/16   Educator  CE   Instruction Review Code  2- meets goals/outcomes      Breathing  Retraining: - Provides individuals verbal and written instruction on purpose, frequency, and proper technique of diaphragmatic breathing and pursed-lipped breathing. Applies individual practice skills.      Pulmonary Rehab from 04/17/2016 in [facility] Cardiac and Pulmonary Rehab   Date  01/09/16   Educator  LB   Instruction Review Code  2- meets goals/outcomes      Anatomy and Physiology of the Lungs: - Group verbal and written instruction with the use of models to provide basic lung anatomy and physiology related to function, structure and complications of lung disease.   Heart Failure: - Group verbal and written instruction on the basics of heart failure: signs/symptoms, treatments, explanation of ejection fraction, enlarged heart and cardiomyopathy.   Sleep Apnea: - Individual verbal and written instruction to review Obstructive Sleep Apnea. Review of risk factors, methods for diagnosing and types of masks and machines for OSA.   Anxiety: - Provides group, verbal and written instruction on the correlation between heart/lung disease and anxiety, treatment options, and management of anxiety.   Relaxation: - Provides group, verbal and written instruction about the benefits of relaxation for patients with heart/lung disease. Also provides patients with examples of relaxation techniques.      Pulmonary Rehab from 04/17/2016 in [facility] Cardiac and Pulmonary Rehab   Date  04/17/16   Educator  Elissa Hefty   Instruction Review Code  2- Meets goals/outcomes      Knowledge Questionnaire Score:     Knowledge Questionnaire Score - 01/09/16 0900    Knowledge Questionnaire Score   Pre Score 10/10       Core Components/Risk Factors/Patient Goals at Admission:     Personal Goals and Risk Factors at Admission - 01/09/16 1013    Core Components/Risk Factors/Patient Goals on Admission   Sedentary Yes   Intervention (read-only) While in program, learn and follow the exercise prescription taught.  Start at a low level workload and increase workload after able to maintain previous level for 30 minutes. Increase time before increasing intensity.  Jessica Hoskinson wants to increase her exercise capacity. She currently exercises at [employer] and does water bicycling twice a week. She hasa history of swimming and does miss the sport.   Improve shortness of breath with ADL's Yes   Intervention (read-only) While in program, learn and follow the exercise prescription taught. Start at a low level workload and increase workload ad advised by the exercise physiologist. Increase time before increasing intensity.  Jessica Marlar does have shortness of breath and some fear of it. She is not on oxygen and does monitor her O2Sats with a home oximeter.   Develop more efficient breathing techniques such as purse lipped breathing and diaphragmatic breathing; and practicing self-pacing with activity Yes   Intervention (read-only) --  Jessica Dishon does use PLB and states it is very helpful with shortness of  breath.   Increase knowledge of respiratory medications and ability to use respiratory devices properly  Yes   Intervention (read-only) While in program, learn to administer MDI, nebulizer, and spacer properly.;Learn to take respiratory medicine as ordered.;While in program, learn to Clean MDI, nebulizers, and spacers properly.  Jessica Lanum uses Breo, Albuterol, and a nebulizer with Duoneb. She does not have a spacer. and plan to give her one when our supply arrives.   Hypertension Yes   Goal Participant will see blood pressure controlled within the values of 140/14m/Hg or within value directed by their physician.   Intervention (read-only) Provide nutrition & aerobic exercise along with prescribed medications to achieve BP 140/90 or less.   Understand more about Heart/Pulmonary Disease. Yes   Intervention While in program utilize professionals for any questions, and attend the education sessions. Great websites to use  are www.americanheart.org or www.lung.org for reliable information.  Jessica JHartwellwas diagnosed with COPD in 2014 and is very interested in learning more about COPD and management of the disease.      Core Components/Risk Factors/Patient Goals Review:      Goals and Risk Factor Review      01/24/16 1555 03/27/16 1457 04/08/16 1254 04/12/16 1242 04/12/16 1515   Core Components/Risk Factors/Patient Goals Review   Personal Goals Review Develop more efficient breathing techniques such as purse lipped breathing and diaphragmatic breathing and practicing self-pacing with activity. Sedentary;Develop more efficient breathing techniques such as purse lipped breathing and diaphragmatic breathing and practicing self-pacing with activity.;Improve shortness of breath with ADL's;Increase knowledge of respiratory medications and ability to use respiratory devices properly. Sedentary;Increase Strength and Stamina;Improve shortness of breath with ADL's  Develop more efficient breathing techniques such as purse lipped breathing and diaphragmatic breathing and practicing self-pacing with activity.   Review Reviewed PLB technique and qued her to use with exercise goals Jessica JFurgasonis improving her stamina. She has not started back to the bike water exercising, but plans to return to that after [facility]. She paces herself at home with activites tto minimalize her shortness of breath and uses PLB with her exercise goals. We discussed her MDI's and was given an aerochamber for her Proventil. I will check on the Breo  to see if appropriate tto use a spacer with this inhaler. [facility] Ltdinformed staff today that yesterday she reached a milestone.  She was able to make her bed without stopping whereas before [facility] she would have to stop at least two times.  She stated she was really happy yesterday  about this improvement/change.  Also she usually dumps cloths on the bed after getting out fo dryer.  Now she is able to fold them up  too.  Not able to put away yet., but stated she is happy she is able to fold colthes up now.  She noticed that she is able to do more housework and able to breathe easier.  Nerea states she is unable to walk at home due to balance issues.  She is scheduled to see a Neurologist in the near future about her balance.  Yalitza reports using 3 pound weights at home.  She states she enjoyes working out with weights and is feeling stronger.   Fareedah blood pressure is stable . c/o heart rate 100 at home just walking. Heart rate upon arrival is 110 most days. Dhriti says she drinks a lot of water every day so she doesn't feel she is dehydrated.  I suggested she talk to her MD since she has not had her thyroid level checked lately. Leatta reports she had a monitor on in Jan or Feb but she wasn't told by her MD that it showed anything. She drinks decaf coffee only in am but on albuteral. Nioka also chews Nicotine gum since even thought she quit smoking 3 years ago she reports "I wake up every morning craving a cigarette this long".  Reviewed PLB with Jessica. [place].  She has good technique when performing PLB and knows when to use it.    Expected Outcomes After one week, independently using PLB with her exercise goals.  Continue with [facility] prescription in order to achieve her goals; improve her stamina; increase her activities; improve her energy/stamina; and decrease her SOB.   Continue using PLB with ADLS and Exercise.   Heart rate 80-90. Blood pressure cont to be stable.  This will decrease her WOB and SOB while exercising and performing ADLs.     04/19/16 1217 04/19/16 1243         Core Components/Risk Factors/Patient Goals Review   Personal Goals Review (p) Increase knowledge of respiratory medications and ability to use respiratory devices properly. Increase knowledge of respiratory medications and ability to use respiratory devices properly.      Review  Reviewed medications with Jessica. [place].  She takes Albuterol, Breo  and Duoneb at home.  She has a spacer for ther MDI's.  She is taking her medications as prescribed and does not have any new questions or concerns with her medications.      Expected Outcomes  Taking her medications as prescribed will decrease her exacerabations and SOB.         Core Components/Risk Factors/Patient Goals at Discharge (Final Review):      Goals and Risk Factor Review - 04/19/16 1243    Core Components/Risk Factors/Patient Goals Review   Personal Goals Review Increase knowledge of respiratory medications and ability to use respiratory devices properly.   Review Reviewed medications with Jessica. [place].  She takes Albuterol, Breo and Duoneb at home.  She has a spacer for ther MDI's.  She is taking her medications as prescribed and does not have any new questions or concerns with her medications.   Expected Outcomes Taking her medications as prescribed will decrease her exacerabations and SOB.      ITP Comments:     ITP Comments      01/24/16 1155 04/12/16 1237         ITP Comments Martyna should complete her goals by 36th session. [facility] c/o  heart rate 100 at home just walking. I suggested she talk to her MD since she has not had her thyroid level checked lately. Taquita reports she had a monitor on in Jan or Feb but she wasn't told by her MD that it showed anything. She drinks decaf coffee only in am but on albuteral. Noheli also chews Nicotine gum since even thought she quit smoking 3 years ago she reports "I wake up every morning craving a cigarette this long".          Comments: 30 Day Review

## 2016-05-27 ENCOUNTER — Encounter: Payer: Self-pay | Admitting: Respiratory Therapy

## 2016-05-27 DIAGNOSIS — J449 Chronic obstructive pulmonary disease, unspecified: Secondary | ICD-10-CM

## 2016-05-27 NOTE — Progress Notes (Signed)
Pulmonary Individual Treatment Plan  Patient Details  Name: OCIE STANZIONE MRN: [id number] Date of Birth: [birth year] Referring Provider:        Documentation from 04/29/2016 in [facility] Cardiac and Pulmonary Rehab   Referring Provider  Dr. Raul Del      Initial Encounter Date:       Documentation from 04/29/2016 in [facility] Cardiac and Pulmonary Rehab   Date  01/09/16   Referring Provider  Dr. Raul Del      Visit Diagnosis: COPD, moderate ([place])  Patient's Home Medications on Admission:  Current outpatient prescriptions:    albuterol (PROAIR HFA) 108 (90 Base) MCG/ACT inhaler, Inhale 2 puffs into the lungs See admin instructions. Inhale 2 puffs every 4 to 6 hours as needed for shortness of breath/wheezing., Disp: , Rfl:    aspirin EC 81 MG tablet, Take 81 mg by mouth at bedtime. , Disp: , Rfl:    Cholecalciferol (VITAMIN D3) 1000 units CAPS, Take 1,000 Units by mouth daily. , Disp: , Rfl:    cyclobenzaprine (FLEXERIL) 5 MG tablet, Take 5 mg by mouth daily as needed for muscle spasms. , Disp: , Rfl:    escitalopram (LEXAPRO) 10 MG tablet, Take 5-10 mg by mouth See admin instructions. Take 1/2 tablet  (5 mg) by mouth every morning, and take 1 tablet by mouth every night at bedtime., Disp: , Rfl:    fluticasone furoate-vilanterol (BREO ELLIPTA) 100-25 MCG/INH AEPB, Inhale 1 puff into the lungs daily. , Disp: , Rfl:    ipratropium-albuterol (DUONEB) 0.5-2.5 (3) MG/3ML SOLN, Take 3 mLs by nebulization 2 (two) times daily as needed. For shortness of breath/wheezing., Disp: , Rfl:    irbesartan-hydrochlorothiazide (AVALIDE) 150-12.5 MG tablet, Take 0.5 tablets by mouth daily. , Disp: , Rfl:    LUMIGAN 0.01 % SOLN, Place 1 drop into both eyes at bedtime., Disp: , Rfl:    Melatonin 5 MG TABS, Take 5 mg by mouth at bedtime as needed. For sleep., Disp: , Rfl:    Multiple Vitamins-Minerals (STRESSTABS ADVANCED) TABS, Take 1 tablet by mouth daily., Disp: , Rfl:    Probiotic Product ([place]), Take 1 capsule by mouth daily. , Disp: , Rfl:   Past Medical History: Past Medical History  Diagnosis Date   Hypertension    Glaucoma    COPD (chronic obstructive pulmonary disease) ([place])    Osteoporosis     Tobacco Use: History  Smoking status   Former Smoker   Quit date: 04/20/2012  Smokeless tobacco   Not on file    Labs: Recent Review Flowsheet Data    There is no flowsheet data to display.       ADL [facility]SD:     Pulmonary Assessment Scores      01/09/16 0900       ADL UCSD   ADL Phase Entry     SOB Score total 50     Rest 0     Walk 3     Stairs 3     Bath 1     Dress 1     Shop 4        Pulmonary Function Assessment:     Pulmonary Function Assessment - 01/09/16 0900    Initial Spirometry Results   FVC% 81 %   FEV1% 59 %   FEV1/FVC Ratio 56.77   Post Bronchodilator Spirometry Results   FVC% 84 %   FEV1% 67 %   FEV1/FVC Ratio 59.69   Breath   Bilateral  Breath Sounds Decreased;Clear   Shortness of Breath Yes;Fear of Shortness of Breath;Limiting activity      Exercise Target Goals:    Exercise Program Goal: Individual exercise prescription set with THRR, safety & activity barriers. Participant demonstrates ability to understand and report RPE using BORG scale, to self-measure pulse accurately, and to acknowledge the importance of the exercise prescription.  Exercise Prescription Goal: Starting with aerobic activity 30 plus minutes a day, 3 days per week for initial exercise prescription. Provide home exercise prescription and guidelines that participant acknowledges understanding prior to discharge.  Activity Barriers & Risk Stratification:     Activity Barriers & Cardiac Risk Stratification - 01/09/16 0900    Activity Barriers & Cardiac Risk Stratification   Activity Barriers Shortness of Breath   Cardiac Risk Stratification Low      6 Minute Walk:     6 Minute Walk      01/09/16 1306       6 Minute Walk    Phase Initial     Distance 1155 feet     Walk Time 6 minutes     # of Rest Breaks 0     RPE 13     Perceived Dyspnea  3     Symptoms No     Resting HR 102 bpm     Resting BP 118/74 mmHg     Max Ex. HR 120 bpm     Max Ex. BP 138/74 mmHg        Initial Exercise Prescription:     Initial Exercise Prescription - 04/29/16 1400    Date of Initial Exercise RX and Referring Provider   Date 01/09/16   Referring Provider Dr. Raul Del      Perform Capillary Blood Glucose checks as needed.  Exercise Prescription Changes:     Exercise Prescription Changes      01/24/16 1500 03/15/16 1300 03/29/16 1200 04/05/16 1200 04/19/16 1130   Response to Exercise   Blood Pressure (Admit) 122/72 mmHg    142/70 mmHg   Blood Pressure (Exercise) 140/80 mmHg    122/60 mmHg   Blood Pressure (Exit) 122/60 mmHg    120/60 mmHg   Heart Rate (Admit) 106 bpm    115 bpm   Heart Rate (Exercise) 113 bpm    118 bpm   Heart Rate (Exit) 104 bpm    97 bpm   Oxygen Saturation (Admit) 97 %    97 %   Oxygen Saturation (Exercise) 90 %    95 %   Oxygen Saturation (Exit) 97 %    97 %   Rating of Perceived Exertion (Exercise) 13    13   Perceived Dyspnea (Exercise) 3    4   Symptoms none    mild headache   Comments Ms Divirgilio's first day of exercise in [facility]. Met goals and tolerated exercise well.       Duration Progress to 50 minutes of aerobic without signs/symptoms of physical distress       Intensity Rest + 30    THRR New  119-137   Progression   Progression  Continue progressive overload as per policy without signs/symptoms or physical distress. Continue progressive overload as per policy without signs/symptoms or physical distress. Continue progressive overload as per policy without signs/symptoms or physical distress. Continue to progress workloads to maintain intensity without signs/symptoms of physical distress.   Resistance Training   Training Prescription No   Yes Yes   Weight    3 3  Reps    10-12  10-12   Treadmill   MPH 1.2 1.2 1.2 1.3    Grade 0 0 0 0    Minutes '11 12 12 15    ' NuStep   Level   2 2    Watts   40 25    Minutes   15 15    T5 Nustep   Level 1       Watts 15       Minutes 10       Biostep-RELP   Level '2 2 2 2    ' Watts 40 '20 20 20    ' Minutes '10 10 15 15    ' Home Exercise Plan   Frequency Add 2 additional days to program exercise sessions.          Exercise Comments:     Exercise Comments      04/24/16 1212 05/22/16 1348         Exercise Comments Ms Palladino has been on a heart monitor and may have a cath per phone call to Laureen - she will return when cleared to exercise and exercise will be modified if needed. Ms Duzan last visit was 04/19/16         Discharge Exercise Prescription (Final Exercise Prescription Changes):     Exercise Prescription Changes - 04/19/16 1130    Response to Exercise   Blood Pressure (Admit) 142/70 mmHg   Blood Pressure (Exercise) 122/60 mmHg   Blood Pressure (Exit) 120/60 mmHg   Heart Rate (Admit) 115 bpm   Heart Rate (Exercise) 118 bpm   Heart Rate (Exit) 97 bpm   Oxygen Saturation (Admit) 97 %   Oxygen Saturation (Exercise) 95 %   Oxygen Saturation (Exit) 97 %   Rating of Perceived Exertion (Exercise) 13   Perceived Dyspnea (Exercise) 4   Symptoms mild headache   Intensity THRR New  119-137   Progression   Progression Continue to progress workloads to maintain intensity without signs/symptoms of physical distress.   Resistance Training   Training Prescription Yes   Weight 3   Reps 10-12       Nutrition:  Target Goals: Understanding of nutrition guidelines, daily intake of sodium <1544m, cholesterol <2065m calories 30% from fat and 7% or less from saturated fats, daily to have 5 or more servings of fruits and vegetables.  Biometrics:     Pre Biometrics - 01/10/16 1105    Pre Biometrics   Height '5\' 1"'  (1.549 m)   Weight 115 lb 12.8 oz (52.527 kg)   Waist Circumference 30.5 inches   Hip  Circumference 36 inches   Waist to Hip Ratio 0.85 %   BMI (Calculated) 21.9       Nutrition Therapy Plan and Nutrition Goals:     Nutrition Therapy & Goals - 01/09/16 0900    Nutrition Therapy   Diet Ms JoAlloccaoes not want to meet with the dietitian. She does her own cooking. She has maintained a weight of 110 to 115lbs her whole life. Ms JoZollarsoes drink over 8 glasses of fluid, mainly water.      Nutrition Discharge: Rate Your Plate Scores:   Psychosocial: Target Goals: Acknowledge presence or absence of depression, maximize coping skills, provide positive support system. Participant is able to verbalize types and ability to use techniques and skills needed for reducing stress and depression.  Initial Review & Psychosocial Screening:     Initial Psych Review & Screening - 01/09/16 0900  Initial Review   Current issues with Current Depression   Family Dynamics   Good Support System? Yes   Comments Ms Skufca is dealing with the lose of her husband in 12/16. She does have good support from her 2 daughters that live close to her. She is learning about COPD and is coping very well with the disease.   Screening Interventions   Interventions Encouraged to exercise;Program counselor consult      Quality of Life Scores:     Quality of Life - 01/09/16 0900    Quality of Life Scores   Health/Function Pre 19.6 %   Socioeconomic Pre 27 %   Psych/Spiritual Pre 22.29 %   Family Pre 19.5 %   GLOBAL Pre 21.56 %      PHQ-9:     Recent Review Flowsheet Data    Depression screen [facility] 2/9 01/09/2016   Decreased Interest 2   Down, Depressed, Hopeless 1   PHQ - 2 Score 3   Altered sleeping 2   Tired, decreased energy 2   Change in appetite 1   Feeling bad or failure about yourself  0   Trouble concentrating 0   Moving slowly or fidgety/restless 0   Suicidal thoughts 0   PHQ-9 Score 8   Difficult doing work/chores Not difficult at all      Psychosocial Evaluation  and Intervention:     Psychosocial Evaluation - 04/08/16 1249    Psychosocial Evaluation & Interventions   Interventions Encouraged to exercise with the program and follow exercise prescription;Relaxation education;Stress management education   Comments Counselor met with Ms. Arguijo today for initial psychosocial evalution.  She is a 75 yr old who was diagnosed with COPD in 2014.  Ms. Poynter lives alone since the death of her spouse this past October 28, 2023 from lung cancer.  She became very sedentary during that time and her health issues increased as a result.  She has multiple health issues including vision & digestive  problems as well as anxiety .  She reports taking medications for all of these - including the anxiety and that they are currently being effective.  Ms. Quirarte states she has trouble getting to sleep, but sleeps at least 7-8 hours per night.  She states this program has already improved her sleep slightly and increased her ability to do normal activities.  Ms. Snader is typically in a positive mood and continues to have anxiety in new situations primarily.  She has an appointment with a neurologist in the next few weeks to assess why she fainted twice over the past several months.  Ms. Kernodle has goals to be more fit and more active.  She plans to look into follow up programs to maintain her progress upon completion of this program.    Continued Psychosocial Services Needed Yes  Ms. Seawright will benefit from the psychoeducational components of this program, especially anxiety and relaxation.        Psychosocial Re-Evaluation:  Education: Education Goals: Education classes will be provided on a weekly basis, covering required topics. Participant will state understanding/return demonstration of topics presented.  Learning Barriers/Preferences:     Learning Barriers/Preferences - 01/09/16 0900    Learning Barriers/Preferences   Learning Barriers None   Learning Preferences Group  Instruction;Individual Instruction;Pictoral;Skilled Demonstration;Verbal Instruction;Video;Written Material      Education Topics: Initial Evaluation Education: - Verbal, written and demonstration of respiratory meds, RPE/PD scales, oximetry and breathing techniques. Instruction on use of nebulizers and MDIs: cleaning and proper use,  rinsing mouth with steroid doses and importance of monitoring MDI activations.          Pulmonary Rehab from 04/17/2016 in [facility] Cardiac and Pulmonary Rehab   Date  01/09/16   Educator  LB   Instruction Review Code  2- meets goals/outcomes      General Nutrition Guidelines/Fats and Fiber: -Group instruction provided by verbal, written material, models and posters to present the general guidelines for heart healthy nutrition. Gives an explanation and review of dietary fats and fiber.   Controlling Sodium/Reading Food Labels: -Group verbal and written material supporting the discussion of sodium use in heart healthy nutrition. Review and explanation with models, verbal and written materials for utilization of the food label.   Exercise Physiology & Risk Factors: - Group verbal and written instruction with models to review the exercise physiology of the cardiovascular system and associated critical values. Details cardiovascular disease risk factors and the goals associated with each risk factor.   Aerobic Exercise & Resistance Training: - Gives group verbal and written discussion on the health impact of inactivity. On the components of aerobic and resistive training programs and the benefits of this training and how to safely progress through these programs.   Flexibility, Balance, General Exercise Guidelines: - Provides group verbal and written instruction on the benefits of flexibility and balance training programs. Provides general exercise guidelines with specific guidelines to those with heart or lung disease. Demonstration and skill practice  provided.      Pulmonary Rehab from 04/17/2016 in [facility] Cardiac and Pulmonary Rehab   Date  03/27/16   Educator  Salley Hews, PT`   Instruction Review Code  2- meets goals/outcomes      Stress Management: - Provides group verbal and written instruction about the health risks of elevated stress, cause of high stress, and healthy ways to reduce stress.   Depression: - Provides group verbal and written instruction on the correlation between heart/lung disease and depressed mood, treatment options, and the stigmas associated with seeking treatment.   Exercise & Equipment Safety: - Individual verbal instruction and demonstration of equipment use and safety with use of the equipment.   Infection Prevention: - Provides verbal and written material to individual with discussion of infection control including proper hand washing and proper equipment cleaning during exercise session.   Falls Prevention: - Provides verbal and written material to individual with discussion of falls prevention and safety.      Pulmonary Rehab from 04/17/2016 in [facility] Cardiac and Pulmonary Rehab   Date  01/09/16   Educator  LB   Instruction Review Code  2- meets goals/outcomes      Diabetes: - Individual verbal and written instruction to review signs/symptoms of diabetes, desired ranges of glucose level fasting, after meals and with exercise. Advice that pre and post exercise glucose checks will be done for 3 sessions at entry of program.   Chronic Lung Diseases: - Group verbal and written instruction to review new updates, new respiratory medications, new advancements in procedures and treatments. Provide informative websites and "800" numbers of self-education.   Lung Procedures: - Group verbal and written instruction to describe testing methods done to diagnose lung disease. Review the outcome of test results. Describe the treatment choices: Pulmonary Function Tests, ABGs and oximetry.      Pulmonary  Rehab from 04/17/2016 in [facility] Cardiac and Pulmonary Rehab   Date  04/12/16   Educator  [place]   Instruction Review Code  2- meets goals/outcomes  Energy Conservation: - Provide group verbal and written instruction for methods to conserve energy, plan and organize activities. Instruct on pacing techniques, use of adaptive equipment and posture/positioning to relieve shortness of breath.   Triggers: - Group verbal and written instruction to review types of environmental controls: home humidity, furnaces, filters, dust mite/pet prevention, HEPA vacuums. To discuss weather changes, air quality and the benefits of nasal washing.   Exacerbations: - Group verbal and written instruction to provide: warning signs, infection symptoms, calling MD promptly, preventive modes, and value of vaccinations. Review: effective airway clearance, coughing and/or vibration techniques. Create an Sports administrator.   Oxygen: - Individual and group verbal and written instruction on oxygen therapy. Includes supplement oxygen, available portable oxygen systems, continuous and intermittent flow rates, oxygen safety, concentrators, and Medicare reimbursement for oxygen.   Respiratory Medications: - Group verbal and written instruction to review medications for lung disease. Drug class, frequency, complications, importance of spacers, rinsing mouth after steroid MDI's, and proper cleaning methods for nebulizers.      Pulmonary Rehab from 04/17/2016 in [facility] Cardiac and Pulmonary Rehab   Date  01/09/16   Educator  LB   Instruction Review Code  2- meets goals/outcomes      AED/CPR: - Group verbal and written instruction with the use of models to demonstrate the basic use of the AED with the basic ABC's of resuscitation.      Pulmonary Rehab from 04/17/2016 in [facility] Cardiac and Pulmonary Rehab   Date  04/05/16   Educator  CE   Instruction Review Code  2- meets goals/outcomes      Breathing Retraining: - Provides  individuals verbal and written instruction on purpose, frequency, and proper technique of diaphragmatic breathing and pursed-lipped breathing. Applies individual practice skills.      Pulmonary Rehab from 04/17/2016 in [facility] Cardiac and Pulmonary Rehab   Date  01/09/16   Educator  LB   Instruction Review Code  2- meets goals/outcomes      Anatomy and Physiology of the Lungs: - Group verbal and written instruction with the use of models to provide basic lung anatomy and physiology related to function, structure and complications of lung disease.   Heart Failure: - Group verbal and written instruction on the basics of heart failure: signs/symptoms, treatments, explanation of ejection fraction, enlarged heart and cardiomyopathy.   Sleep Apnea: - Individual verbal and written instruction to review Obstructive Sleep Apnea. Review of risk factors, methods for diagnosing and types of masks and machines for OSA.   Anxiety: - Provides group, verbal and written instruction on the correlation between heart/lung disease and anxiety, treatment options, and management of anxiety.   Relaxation: - Provides group, verbal and written instruction about the benefits of relaxation for patients with heart/lung disease. Also provides patients with examples of relaxation techniques.      Pulmonary Rehab from 04/17/2016 in [facility] Cardiac and Pulmonary Rehab   Date  04/17/16   Educator  Elissa Hefty   Instruction Review Code  2- Meets goals/outcomes      Knowledge Questionnaire Score:     Knowledge Questionnaire Score - 01/09/16 0900    Knowledge Questionnaire Score   Pre Score 10/10       Core Components/Risk Factors/Patient Goals at Admission:     Personal Goals and Risk Factors at Admission - 01/09/16 1013    Core Components/Risk Factors/Patient Goals on Admission   Sedentary Yes   Intervention (read-only) While in program, learn and follow the exercise prescription taught.  Start at a low level  workload and increase workload after able to maintain previous level for 30 minutes. Increase time before increasing intensity.  Ms Goelz wants to increase her exercise capacity. She currently exercises at [employer] and does water bicycling twice a week. She hasa history of swimming and does miss the sport.   Improve shortness of breath with ADL's Yes   Intervention (read-only) While in program, learn and follow the exercise prescription taught. Start at a low level workload and increase workload ad advised by the exercise physiologist. Increase time before increasing intensity.  Ms Whistler does have shortness of breath and some fear of it. She is not on oxygen and does monitor her O2Sats with a home oximeter.   Develop more efficient breathing techniques such as purse lipped breathing and diaphragmatic breathing; and practicing self-pacing with activity Yes   Intervention (read-only) --  Ms Whetsel does use PLB and states it is very helpful with shortness of  breath.   Increase knowledge of respiratory medications and ability to use respiratory devices properly  Yes   Intervention (read-only) While in program, learn to administer MDI, nebulizer, and spacer properly.;Learn to take respiratory medicine as ordered.;While in program, learn to Clean MDI, nebulizers, and spacers properly.  Ms Jarema uses Breo, Albuterol, and a nebulizer with Duoneb. She does not have a spacer. and plan to give her one when our supply arrives.   Hypertension Yes   Goal Participant will see blood pressure controlled within the values of 140/52m/Hg or within value directed by their physician.   Intervention (read-only) Provide nutrition & aerobic exercise along with prescribed medications to achieve BP 140/90 or less.   Understand more about Heart/Pulmonary Disease. Yes   Intervention While in program utilize professionals for any questions, and attend the education sessions. Great websites to use are  www.americanheart.org or www.lung.org for reliable information.  Ms JMarstonwas diagnosed with COPD in 2014 and is very interested in learning more about COPD and management of the disease.      Core Components/Risk Factors/Patient Goals Review:      Goals and Risk Factor Review      01/24/16 1555 03/27/16 1457 04/08/16 1254 04/12/16 1242 04/12/16 1515   Core Components/Risk Factors/Patient Goals Review   Personal Goals Review Develop more efficient breathing techniques such as purse lipped breathing and diaphragmatic breathing and practicing self-pacing with activity. Sedentary;Develop more efficient breathing techniques such as purse lipped breathing and diaphragmatic breathing and practicing self-pacing with activity.;Improve shortness of breath with ADL's;Increase knowledge of respiratory medications and ability to use respiratory devices properly. Sedentary;Increase Strength and Stamina;Improve shortness of breath with ADL's  Develop more efficient breathing techniques such as purse lipped breathing and diaphragmatic breathing and practicing self-pacing with activity.   Review Reviewed PLB technique and qued her to use with exercise goals Ms JPinkstaffis improving her stamina. She has not started back to the bike water exercising, but plans to return to that after [facility]. She paces herself at home with activites tto minimalize her shortness of breath and uses PLB with her exercise goals. We discussed her MDI's and was given an aerochamber for her Proventil. I will check on the Breo  to see if appropriate tto use a spacer with this inhaler. [facility] a [facility].  She was able to make her bed without stopping whereas before [facility] she would have to stop at least two times.  She stated she was really happy  yesterday about this improvement/change.  Also she usually dumps cloths on the bed after getting out fo dryer.  Now she is able to fold them up too.   Not able to put away yet., but stated she is happy she is able to fold colthes up now.  She noticed that she is able to do more housework and able to breathe easier.  Ariannie states she is unable to walk at home due to balance issues.  She is scheduled to see a Neurologist in the near future about her balance.  Ondrea reports using 3 pound weights at home.  She states she enjoyes working out with weights and is feeling stronger.   Kenzey blood pressure is stable . c/o heart rate 100 at home just walking. Heart rate upon arrival is 110 most days. Jaila says she drinks a lot of water every day so she doesn't feel she is dehydrated.  I suggested she talk to her MD since she has not had her thyroid level checked lately. Shahrzad reports she had a monitor on in Jan or Feb but she wasn't told by her MD that it showed anything. She drinks decaf coffee only in am but on albuteral. Tommye also chews Nicotine gum since even thought she quit smoking 3 years ago she reports "I wake up every morning craving a cigarette this long".  Reviewed PLB with Ms. Onofrio.  She has good technique when performing PLB and knows when to use it.    Expected Outcomes After one week, independently using PLB with her exercise goals.  Continue with [facility] prescription in order to achieve her goals; improve her stamina; increase her activities; improve her energy/stamina; and decrease her SOB.   Continue using PLB with ADLS and Exercise.   Heart rate 80-90. Blood pressure cont to be stable.  This will decrease her WOB and SOB while exercising and performing ADLs.     04/19/16 1217 04/19/16 1243         Core Components/Risk Factors/Patient Goals Review   Personal Goals Review (p) Increase knowledge of respiratory medications and ability to use respiratory devices properly. Increase knowledge of respiratory medications and ability to use respiratory devices properly.      Review  Reviewed medications with Ms. Hayman.  She takes Albuterol, Breo and  Duoneb at home.  She has a spacer for ther MDI's.  She is taking her medications as prescribed and does not have any new questions or concerns with her medications.      Expected Outcomes  Taking her medications as prescribed will decrease her exacerabations and SOB.         Core Components/Risk Factors/Patient Goals at Discharge (Final Review):      Goals and Risk Factor Review - 04/19/16 1243    Core Components/Risk Factors/Patient Goals Review   Personal Goals Review Increase knowledge of respiratory medications and ability to use respiratory devices properly.   Review Reviewed medications with Ms. Jonsson.  She takes Albuterol, Breo and Duoneb at home.  She has a spacer for ther MDI's.  She is taking her medications as prescribed and does not have any new questions or concerns with her medications.   Expected Outcomes Taking her medications as prescribed will decrease her exacerabations and SOB.      ITP Comments:     ITP Comments      01/24/16 1155 04/12/16 1237 05/22/16 1348       ITP Comments Roshanna should complete her goals by 36th session. [facility]  c/o heart rate 100 at home just walking. I suggested she talk to her MD since she has not had her thyroid level checked lately. Gabryella reports she had a monitor on in Jan or Feb but she wasn't told by her MD that it showed anything. She drinks decaf coffee only in am but on albuteral. Mikenzie also chews Nicotine gum since even thought she quit smoking 3 years ago she reports "I wake up every morning craving a cigarette this long".  Ms Heyboer last visit was 04/19/16        Comments: 30 note day review

## 2016-06-03 ENCOUNTER — Encounter: Payer: Self-pay | Admitting: Respiratory Therapy

## 2016-06-03 DIAGNOSIS — J449 Chronic obstructive pulmonary disease, unspecified: Secondary | ICD-10-CM

## 2016-06-03 NOTE — Progress Notes (Signed)
Jessica Bowman called on 05/30/2016 to update her medical issues: she has just got off her 30 day heart monitor; she has an appointment with her cardiologist on 06/18/2016; and then she will know when she can return to [place]

## 2016-06-24 ENCOUNTER — Encounter: Payer: Self-pay | Admitting: Respiratory Therapy

## 2016-06-24 DIAGNOSIS — J449 Chronic obstructive pulmonary disease, unspecified: Secondary | ICD-10-CM

## 2016-06-24 NOTE — Progress Notes (Signed)
Pulmonary Individual Treatment Plan  Patient Details  Name: Jessica Bowman MRN: [id number] Date of Birth: [birth year] Referring Provider:   Arn Medal Row Documentation from 04/29/2016 in [facility] Cardiac and Pulmonary Rehab  Referring Provider  Dr. Raul Del      Initial Encounter Date:  Flowsheet Row Documentation from 04/29/2016 in [facility] Cardiac and Pulmonary Rehab  Date  01/09/16  Referring Provider  Dr. Raul Del      Visit Diagnosis: COPD, moderate ([place])  Patient's Home Medications on Admission:  Current Outpatient Prescriptions:    albuterol (PROAIR HFA) 108 (90 Base) MCG/ACT inhaler, Inhale 2 puffs into the lungs See admin instructions. Inhale 2 puffs every 4 to 6 hours as needed for shortness of breath/wheezing., Disp: , Rfl:    aspirin EC 81 MG tablet, Take 81 mg by mouth at bedtime. , Disp: , Rfl:    Cholecalciferol (VITAMIN D3) 1000 units CAPS, Take 1,000 Units by mouth daily. , Disp: , Rfl:    cyclobenzaprine (FLEXERIL) 5 MG tablet, Take 5 mg by mouth daily as needed for muscle spasms. , Disp: , Rfl:    escitalopram (LEXAPRO) 10 MG tablet, Take 5-10 mg by mouth See admin instructions. Take 1/2 tablet  (5 mg) by mouth every morning, and take 1 tablet by mouth every night at bedtime., Disp: , Rfl:    fluticasone furoate-vilanterol (BREO ELLIPTA) 100-25 MCG/INH AEPB, Inhale 1 puff into the lungs daily. , Disp: , Rfl:    ipratropium-albuterol (DUONEB) 0.5-2.5 (3) MG/3ML SOLN, Take 3 mLs by nebulization 2 (two) times daily as needed. For shortness of breath/wheezing., Disp: , Rfl:    irbesartan-hydrochlorothiazide (AVALIDE) 150-12.5 MG tablet, Take 0.5 tablets by mouth daily. , Disp: , Rfl:    LUMIGAN 0.01 % SOLN, Place 1 drop into both eyes at bedtime., Disp: , Rfl:    Melatonin 5 MG TABS, Take 5 mg by mouth at bedtime as needed. For sleep., Disp: , Rfl:    Multiple Vitamins-Minerals (STRESSTABS ADVANCED) TABS, Take 1 tablet by mouth daily., Disp: , Rfl:    Probiotic Product  ([place]), Take 1 capsule by mouth daily. , Disp: , Rfl:   Past Medical History: Past Medical History:  Diagnosis Date   COPD (chronic obstructive pulmonary disease) ([place])    Glaucoma    Hypertension    Osteoporosis     Tobacco Use: History  Smoking Status   Former Smoker   Quit date: 04/20/2012  Smokeless Tobacco   Not on file    Labs: Recent Review Flowsheet Data    There is no flowsheet data to display.       ADL UCSD:     Pulmonary Assessment Scores    Row Name 01/09/16 0900         ADL UCSD   ADL Phase Entry     SOB Score total 50     Rest 0     Walk 3     Stairs 3     Bath 1     Dress 1     Shop 4        Pulmonary Function Assessment:     Pulmonary Function Assessment - 01/09/16 0900      Initial Spirometry Results   FVC% 81 %   FEV1% 59 %   FEV1/FVC Ratio 56.77     Post Bronchodilator Spirometry Results   FVC% 84 %   FEV1% 67 %   FEV1/FVC Ratio 59.69     Breath   Bilateral Breath  Sounds Decreased;Clear   Shortness of Breath Yes;Fear of Shortness of Breath;Limiting activity      Exercise Target Goals:    Exercise Program Goal: Individual exercise prescription set with THRR, safety & activity barriers. Participant demonstrates ability to understand and report RPE using BORG scale, to self-measure pulse accurately, and to acknowledge the importance of the exercise prescription.  Exercise Prescription Goal: Starting with aerobic activity 30 plus minutes a day, 3 days per week for initial exercise prescription. Provide home exercise prescription and guidelines that participant acknowledges understanding prior to discharge.  Activity Barriers & Risk Stratification:     Activity Barriers & Cardiac Risk Stratification - 01/09/16 0900      Activity Barriers & Cardiac Risk Stratification   Activity Barriers Shortness of Breath   Cardiac Risk Stratification Low      6 Minute Walk:     6 Minute Walk    Row  Name 01/09/16 1306         6 Minute Walk   Phase Initial     Distance 1155 feet     Walk Time 6 minutes     # of Rest Breaks 0     RPE 13     Perceived Dyspnea  3     Symptoms No     Resting HR 102 bpm     Resting BP 118/74     Max Ex. HR 120 bpm     Max Ex. BP 138/74        Initial Exercise Prescription:     Initial Exercise Prescription - 04/29/16 1400      Date of Initial Exercise RX and Referring Provider   Date 01/09/16   Referring Provider Dr. Raul Del      Perform Capillary Blood Glucose checks as needed.  Exercise Prescription Changes:     Exercise Prescription Changes    Row Name 01/24/16 1500 03/15/16 1300 03/29/16 1200 04/05/16 1200 04/19/16 1130     Response to Exercise   Blood Pressure (Admit) 122/72  --  --  -- 142/70   Blood Pressure (Exercise) 140/80  --  --  -- 122/60   Blood Pressure (Exit) 122/60  --  --  -- 120/60   Heart Rate (Admit) 106 bpm  --  --  -- 115 bpm   Heart Rate (Exercise) 113 bpm  --  --  -- 118 bpm   Heart Rate (Exit) 104 bpm  --  --  -- 97 bpm   Oxygen Saturation (Admit) 97 %  --  --  -- 97 %   Oxygen Saturation (Exercise) 90 %  --  --  -- 95 %   Oxygen Saturation (Exit) 97 %  --  --  -- 97 %   Rating of Perceived Exertion (Exercise) 13  --  --  -- 13   Perceived Dyspnea (Exercise) 3  --  --  -- 4   Symptoms none  --  --  -- mild headache   Comments Ms Caven's first day of exercise in [facility]. Met goals and tolerated exercise well.  --  --  --  --   Duration Progress to 50 minutes of aerobic without signs/symptoms of physical distress  --  --  --  --   Intensity Rest + 30  --  --  -- THRR New  119-137     Progression   Progression  -- Continue progressive overload as per policy without signs/symptoms or physical distress. Continue progressive overload as per policy without  signs/symptoms or physical distress. Continue progressive overload as per policy without signs/symptoms or physical distress. Continue to progress  workloads to maintain intensity without signs/symptoms of physical distress.     Resistance Training   Training Prescription No  --  -- Yes Yes   Weight  --  --  -- 3 3   Reps  --  --  -- 10-12 10-12     Treadmill   MPH 1.2 1.2 1.2 1.3  --   Grade 0 0 0 0  --   Minutes '11 12 12 15  ' --     NuStep   Level  --  -- 2 2  --   Watts  --  -- 40 25  --   Minutes  --  -- 15 15  --     T5 Nustep   Level 1  --  --  --  --   Watts 15  --  --  --  --   Minutes 10  --  --  --  --     Biostep-RELP   Level '2 2 2 2  ' --   Watts 40 '20 20 20  ' --   Minutes '10 10 15 15  ' --     Home Exercise Plan   Frequency Add 2 additional days to program exercise sessions.  --  --  --  --      Exercise Comments:     Exercise Comments    Row Name 04/24/16 1212 05/22/16 1348 06/06/16 1417 06/21/16 1020     Exercise Comments Ms Flaim has been on a heart monitor and may have a cath per phone call to Laureen - she will return when cleared to exercise and exercise will be modified if needed. Ms Vandalen last visit was 04/19/16 Ms Goldblatt last visit was 04/19/16.  She has an appointment with cardiologist 8/1 and will get clearance befor ereturning to [place]. Ms Neuhaus has not attended since 04/19/16       Discharge Exercise Prescription (Final Exercise Prescription Changes):     Exercise Prescription Changes - 04/19/16 1130      Response to Exercise   Blood Pressure (Admit) 142/70   Blood Pressure (Exercise) 122/60   Blood Pressure (Exit) 120/60   Heart Rate (Admit) 115 bpm   Heart Rate (Exercise) 118 bpm   Heart Rate (Exit) 97 bpm   Oxygen Saturation (Admit) 97 %   Oxygen Saturation (Exercise) 95 %   Oxygen Saturation (Exit) 97 %   Rating of Perceived Exertion (Exercise) 13   Perceived Dyspnea (Exercise) 4   Symptoms mild headache   Intensity THRR New  119-137     Progression   Progression Continue to progress workloads to maintain intensity without signs/symptoms of physical distress.      Resistance Training   Training Prescription Yes   Weight 3   Reps 10-12       Nutrition:  Target Goals: Understanding of nutrition guidelines, daily intake of sodium <1576m, cholesterol <2024m calories 30% from fat and 7% or less from saturated fats, daily to have 5 or more servings of fruits and vegetables.  Biometrics:     Pre Biometrics - 01/10/16 1105      Pre Biometrics   Height '5\' 1"'  (1.549 m)   Weight 115 lb 12.8 oz (52.5 kg)   Waist Circumference 30.5 inches   Hip Circumference 36 inches   Waist to Hip Ratio 0.85 %   BMI (Calculated) 21.9  Nutrition Therapy Plan and Nutrition Goals:     Nutrition Therapy & Goals - 01/09/16 0900      Nutrition Therapy   Diet Ms Lovins does not want to meet with the dietitian. She does her own cooking. She has maintained a weight of 110 to 115lbs her whole life. Ms Swinson does drink over 8 glasses of fluid, mainly water.      Nutrition Discharge: Rate Your Plate Scores:   Psychosocial: Target Goals: Acknowledge presence or absence of depression, maximize coping skills, provide positive support system. Participant is able to verbalize types and ability to use techniques and skills needed for reducing stress and depression.  Initial Review & Psychosocial Screening:     Initial Psych Review & Screening - 01/09/16 0900      Initial Review   Current issues with Current Depression     Family Dynamics   Good Support System? Yes   Comments Ms Ferre is dealing with the lose of her husband in 12/16. She does have good support from her 2 daughters that live close to her. She is learning about COPD and is coping very well with the disease.     Screening Interventions   Interventions Encouraged to exercise;Program counselor consult      Quality of Life Scores:     Quality of Life - 01/09/16 0900      Quality of Life Scores   Health/Function Pre 19.6 %   Socioeconomic Pre 27 %   Psych/Spiritual Pre 22.29 %    Family Pre 19.5 %   GLOBAL Pre 21.56 %      PHQ-9: Recent Review Flowsheet Data    Depression screen [facility] 2/9 01/09/2016   Decreased Interest 2   Down, Depressed, Hopeless 1   PHQ - 2 Score 3   Altered sleeping 2   Tired, decreased energy 2   Change in appetite 1   Feeling bad or failure about yourself  0   Trouble concentrating 0   Moving slowly or fidgety/restless 0   Suicidal thoughts 0   PHQ-9 Score 8   Difficult doing work/chores Not difficult at all      Psychosocial Evaluation and Intervention:     Psychosocial Evaluation - 04/08/16 1249      Psychosocial Evaluation & Interventions   Interventions Encouraged to exercise with the program and follow exercise prescription;Relaxation education;Stress management education   Comments Counselor met with Ms. Grout today for initial psychosocial evalution.  She is a 75 yr old who was diagnosed with COPD in 2014.  Ms. Cryderman lives alone since the death of her spouse this past 10/23/2023 from lung cancer.  She became very sedentary during that time and her health issues increased as a result.  She has multiple health issues including vision & digestive  problems as well as anxiety .  She reports taking medications for all of these - including the anxiety and that they are currently being effective.  Ms. Schnieders states she has trouble getting to sleep, but sleeps at least 7-8 hours per night.  She states this program has already improved her sleep slightly and increased her ability to do normal activities.  Ms. Chervenak is typically in a positive mood and continues to have anxiety in new situations primarily.  She has an appointment with a neurologist in the next few weeks to assess why she fainted twice over the past several months.  Ms. Atlas has goals to be more fit and more active.  She plans  to look into follow up programs to maintain her progress upon completion of this program.    Continued Psychosocial Services Needed Yes  Ms.  Fogal will benefit from the psychoeducational components of this program, especially anxiety and relaxation.        Psychosocial Re-Evaluation:     Psychosocial Re-Evaluation    Row Name 06/24/16 [phone]             Psychosocial Re-Evaluation   Comments Ms Hritz has had medical issues and has not attended since 04/19/2016. IN talking with her several times, she states she is looking forward to returning to [place] for it has been so encouraging for her both mentally and physically.         Education: Education Goals: Education classes will be provided on a weekly basis, covering required topics. Participant will state understanding/return demonstration of topics presented.  Learning Barriers/Preferences:     Learning Barriers/Preferences - 01/09/16 0900      Learning Barriers/Preferences   Learning Barriers None   Learning Preferences Group Instruction;Individual Instruction;Pictoral;Skilled Demonstration;Verbal Instruction;Video;Written Material      Education Topics: Initial Evaluation Education: - Verbal, written and demonstration of respiratory meds, RPE/PD scales, oximetry and breathing techniques. Instruction on use of nebulizers and MDIs: cleaning and proper use, rinsing mouth with steroid doses and importance of monitoring MDI activations. Flowsheet Row Pulmonary Rehab from 04/17/2016 in [facility] Pulmonary Rehab  Date  01/09/16  Educator  LB  Instruction Review Code  2- meets goals/outcomes      General Nutrition Guidelines/Fats and Fiber: -Group instruction provided by verbal, written material, models and posters to present the general guidelines for heart healthy nutrition. Gives an explanation and review of dietary fats and fiber.   Controlling Sodium/Reading Food Labels: -Group verbal and written material supporting the discussion of sodium use in heart healthy nutrition. Review and explanation with models, verbal and written materials for  utilization of the food label.   Exercise Physiology & Risk Factors: - Group verbal and written instruction with models to review the exercise physiology of the cardiovascular system and associated critical values. Details cardiovascular disease risk factors and the goals associated with each risk factor.   Aerobic Exercise & Resistance Training: - Gives group verbal and written discussion on the health impact of inactivity. On the components of aerobic and resistive training programs and the benefits of this training and how to safely progress through these programs.   Flexibility, Balance, General Exercise Guidelines: - Provides group verbal and written instruction on the benefits of flexibility and balance training programs. Provides general exercise guidelines with specific guidelines to those with heart or lung disease. Demonstration and skill practice provided. Flowsheet Row Pulmonary Rehab from 04/17/2016 in [facility] Cardiac and Pulmonary Rehab  Date  03/27/16  Educator  Salley Hews, PT`  Instruction Review Code  2- meets goals/outcomes      Stress Management: - Provides group verbal and written instruction about the health risks of elevated stress, cause of high stress, and healthy ways to reduce stress.   Depression: - Provides group verbal and written instruction on the correlation between heart/lung disease and depressed mood, treatment options, and the stigmas associated with seeking treatment.   Exercise & Equipment Safety: - Individual verbal instruction and demonstration of equipment use and safety with use of the equipment.   Infection Prevention: - Provides verbal and written material to individual with discussion of infection control including proper hand washing and proper equipment cleaning during exercise session.  Falls Prevention: - Provides verbal and written material to individual with discussion of falls prevention and safety. Flowsheet Row Pulmonary  Rehab from 04/17/2016 in [facility] Cardiac and Pulmonary Rehab  Date  01/09/16  Educator  LB  Instruction Review Code  2- meets goals/outcomes      Diabetes: - Individual verbal and written instruction to review signs/symptoms of diabetes, desired ranges of glucose level fasting, after meals and with exercise. Advice that pre and post exercise glucose checks will be done for 3 sessions at entry of program.   Chronic Lung Diseases: - Group verbal and written instruction to review new updates, new respiratory medications, new advancements in procedures and treatments. Provide informative websites and "800" numbers of self-education.   Lung Procedures: - Group verbal and written instruction to describe testing methods done to diagnose lung disease. Review the outcome of test results. Describe the treatment choices: Pulmonary Function Tests, ABGs and oximetry. Flowsheet Row Pulmonary Rehab from 04/17/2016 in [facility] Cardiac and Pulmonary Rehab  Date  04/12/16  Educator  [place]  Instruction Review Code  2- meets goals/outcomes      Energy Conservation: - Provide group verbal and written instruction for methods to conserve energy, plan and organize activities. Instruct on pacing techniques, use of adaptive equipment and posture/positioning to relieve shortness of breath.   Triggers: - Group verbal and written instruction to review types of environmental controls: home humidity, furnaces, filters, dust mite/pet prevention, HEPA vacuums. To discuss weather changes, air quality and the benefits of nasal washing.   Exacerbations: - Group verbal and written instruction to provide: warning signs, infection symptoms, calling MD promptly, preventive modes, and value of vaccinations. Review: effective airway clearance, coughing and/or vibration techniques. Create an Sports administrator.   Oxygen: - Individual and group verbal and written instruction on oxygen therapy. Includes supplement oxygen, available portable  oxygen systems, continuous and intermittent flow rates, oxygen safety, concentrators, and Medicare reimbursement for oxygen.   Respiratory Medications: - Group verbal and written instruction to review medications for lung disease. Drug class, frequency, complications, importance of spacers, rinsing mouth after steroid MDI's, and proper cleaning methods for nebulizers. Flowsheet Row Pulmonary Rehab from 04/17/2016 in [facility] Cardiac and Pulmonary Rehab  Date  01/09/16  Educator  LB  Instruction Review Code  2- meets goals/outcomes      AED/CPR: - Group verbal and written instruction with the use of models to demonstrate the basic use of the AED with the basic ABC's of resuscitation. Flowsheet Row Pulmonary Rehab from 04/17/2016 in [facility] Cardiac and Pulmonary Rehab  Date  04/05/16  Educator  CE  Instruction Review Code  2- meets goals/outcomes      Breathing Retraining: - Provides individuals verbal and written instruction on purpose, frequency, and proper technique of diaphragmatic breathing and pursed-lipped breathing. Applies individual practice skills. Flowsheet Row Pulmonary Rehab from 04/17/2016 in [facility] Cardiac and Pulmonary Rehab  Date  01/09/16  Educator  LB  Instruction Review Code  2- meets goals/outcomes      Anatomy and Physiology of the Lungs: - Group verbal and written instruction with the use of models to provide basic lung anatomy and physiology related to function, structure and complications of lung disease.   Heart Failure: - Group verbal and written instruction on the basics of heart failure: signs/symptoms, treatments, explanation of ejection fraction, enlarged heart and cardiomyopathy.   Sleep Apnea: - Individual verbal and written instruction to review Obstructive Sleep Apnea. Review of risk factors, methods for diagnosing and types of  masks and machines for OSA.   Anxiety: - Provides group, verbal and written instruction on the correlation between heart/lung  disease and anxiety, treatment options, and management of anxiety.   Relaxation: - Provides group, verbal and written instruction about the benefits of relaxation for patients with heart/lung disease. Also provides patients with examples of relaxation techniques. Flowsheet Row Pulmonary Rehab from 04/17/2016 in [facility] Cardiac and Pulmonary Rehab  Date  04/17/16  Educator  Elissa Hefty  Instruction Review Code  2- Meets goals/outcomes      Knowledge Questionnaire Score:     Knowledge Questionnaire Score - 01/09/16 0900      Knowledge Questionnaire Score   Pre Score 10/10       Core Components/Risk Factors/Patient Goals at Admission:     Personal Goals and Risk Factors at Admission - 01/09/16 1013      Core Components/Risk Factors/Patient Goals on Admission   Sedentary Yes   Intervention (read-only) While in program, learn and follow the exercise prescription taught. Start at a low level workload and increase workload after able to maintain previous level for 30 minutes. Increase time before increasing intensity.  Ms Randon wants to increase her exercise capacity. She currently exercises at [employer] and does water bicycling twice a week. She hasa history of swimming and does miss the sport.   Improve shortness of breath with ADL's Yes   Intervention (read-only) While in program, learn and follow the exercise prescription taught. Start at a low level workload and increase workload ad advised by the exercise physiologist. Increase time before increasing intensity.  Ms Cappiello does have shortness of breath and some fear of it. She is not on oxygen and does monitor her O2Sats with a home oximeter.   Develop more efficient breathing techniques such as purse lipped breathing and diaphragmatic breathing; and practicing self-pacing with activity Yes   Intervention (read-only) --  Ms Greeno does use PLB and states it is very helpful with shortness of  breath.   Increase knowledge of  respiratory medications and ability to use respiratory devices properly  Yes   Intervention (read-only) While in program, learn to administer MDI, nebulizer, and spacer properly.;Learn to take respiratory medicine as ordered.;While in program, learn to Clean MDI, nebulizers, and spacers properly.  Ms Cowper uses Breo, Albuterol, and a nebulizer with Duoneb. She does not have a spacer. and plan to give her one when our supply arrives.   Hypertension Yes   Goal Participant will see blood pressure controlled within the values of 140/79m/Hg or within value directed by their physician.   Intervention (read-only) Provide nutrition & aerobic exercise along with prescribed medications to achieve BP 140/90 or less.   Understand more about Heart/Pulmonary Disease. Yes   Intervention While in program utilize professionals for any questions, and attend the education sessions. Great websites to use are www.americanheart.org or www.lung.org for reliable information.  Ms JSolewas diagnosed with COPD in 2014 and is very interested in learning more about COPD and management of the disease.      Core Components/Risk Factors/Patient Goals Review:      Goals and Risk Factor Review    Row Name 01/24/16 1555 03/27/16 1457 04/08/16 1254 04/12/16 1242 04/12/16 1515     Core Components/Risk Factors/Patient Goals Review   Personal Goals Review Develop more efficient breathing techniques such as purse lipped breathing and diaphragmatic breathing and practicing self-pacing with activity. Sedentary;Develop more efficient breathing techniques such as purse lipped breathing and diaphragmatic breathing and practicing  self-pacing with activity.;Improve shortness of breath with ADL's;Increase knowledge of respiratory medications and ability to use respiratory devices properly. Sedentary;Increase Strength and Stamina;Improve shortness of breath with ADL's  -- Develop more efficient breathing techniques such as purse lipped  breathing and diaphragmatic breathing and practicing self-pacing with activity.   Review Reviewed PLB technique and qued her to use with exercise goals Ms Steele is improving her stamina. She has not started back to the bike water exercising, but plans to return to that after [facility]. She paces herself at home with activites tto minimalize her shortness of breath and uses PLB with her exercise goals. We discussed her MDI's and was given an aerochamber for her Proventil. I will check on the Breo  to see if appropriate tto use a spacer with this inhaler. [facility] informed staff today that yesterday she reached a milestone.  She was able to make her bed without stopping whereas before [facility] she would have to stop at least two times.  She stated she was really happy yesterday about this improvement/change.  Also she usually dumps cloths on the bed after getting out fo dryer.  Now she is able to fold them up too.  Not able to put away yet., but stated she is happy she is able to fold colthes up now.  She noticed that she is able to do more housework and able to breathe easier.  Davy states she is unable to walk at home due to balance issues.  She is scheduled to see a Neurologist in the near future about her balance.  Priyal reports using 3 pound weights at home.  She states she enjoyes working out with weights and is feeling stronger.   Yarelie blood pressure is stable . c/o heart rate 100 at home just walking. Heart rate upon arrival is 110 most days. Shawnay says she drinks a lot of water every day so she doesn't feel she is dehydrated.  I suggested she talk to her MD since she has not had her thyroid level checked lately. Monai reports she had a monitor on in Jan or Feb but she wasn't told by her MD that it showed anything. She drinks decaf coffee only in am but on albuteral. Deshay also chews Nicotine gum since even thought she quit smoking 3 years ago she reports "I wake up every morning craving a cigarette this long".   Reviewed PLB with Ms. Kerwin.  She has good technique when performing PLB and knows when to use it.    Expected Outcomes After one week, independently using PLB with her exercise goals.  -- Continue with [facility] prescription in order to achieve her goals; improve her stamina; increase her activities; improve her energy/stamina; and decrease her SOB.   Continue using PLB with ADLS and Exercise.   Heart rate 80-90. Blood pressure cont to be stable.  This will decrease her WOB and SOB while exercising and performing ADLs.   [place] Name 04/19/16 1217 04/19/16 1243           Core Components/Risk Factors/Patient Goals Review   Personal Goals Review (P)  Increase knowledge of respiratory medications and ability to use respiratory devices properly. Increase knowledge of respiratory medications and ability to use respiratory devices properly.      Review  -- Reviewed medications with Ms. Grater.  She takes Albuterol, Breo and Duoneb at home.  She has a spacer for ther MDI's.  She is taking her medications as prescribed and does not have any new  questions or concerns with her medications.      Expected Outcomes  -- Taking her medications as prescribed will decrease her exacerabations and SOB.         Core Components/Risk Factors/Patient Goals at Discharge (Final Review):      Goals and Risk Factor Review - 04/19/16 1243      Core Components/Risk Factors/Patient Goals Review   Personal Goals Review Increase knowledge of respiratory medications and ability to use respiratory devices properly.   Review Reviewed medications with Ms. Gassert.  She takes Albuterol, Breo and Duoneb at home.  She has a spacer for ther MDI's.  She is taking her medications as prescribed and does not have any new questions or concerns with her medications.   Expected Outcomes Taking her medications as prescribed will decrease her exacerabations and SOB.      ITP Comments:     ITP Comments    Row Name 01/24/16 1155  04/12/16 1237 05/22/16 1348 06/03/16 0745 06/06/16 1418   ITP Comments Jessabelle should complete her goals by 36th session. Dorinda c/o heart rate 100 at home just walking. I suggested she talk to her MD since she has not had her thyroid level checked lately. Reet reports she had a monitor on in Jan or Feb but she wasn't told by her MD that it showed anything. She drinks decaf coffee only in am but on albuteral. Emmanuel also chews Nicotine gum since even thought she quit smoking 3 years ago she reports "I wake up every morning craving a cigarette this long".  Ms Heimsoth last visit was 04/19/16 Ms Butrum called on 05/30/2016 to update her medical issues: she has just got off her 30 day heart monitor; she has an appointment with her cardiologist on 06/18/2016; and then she will know when she can return to [place]. Ms Dutko last visit was 04/19/16.  She has an appointment with cardiologist 8/1 and will get clearance befor ereturning to [place].   [place] Name 06/21/16 1021           ITP Comments Ms Vaughn has not attended since 04/19/16          Comments: 30 day note review

## 2016-07-04 DIAGNOSIS — J449 Chronic obstructive pulmonary disease, unspecified: Secondary | ICD-10-CM

## 2016-07-10 ENCOUNTER — Encounter: Payer: Medicare Other | Attending: Specialist | Admitting: *Deleted

## 2016-07-10 DIAGNOSIS — M810 Age-related osteoporosis without current pathological fracture: Secondary | ICD-10-CM | POA: Insufficient documentation

## 2016-07-10 DIAGNOSIS — I10 Essential (primary) hypertension: Secondary | ICD-10-CM | POA: Insufficient documentation

## 2016-07-10 DIAGNOSIS — J449 Chronic obstructive pulmonary disease, unspecified: Secondary | ICD-10-CM | POA: Insufficient documentation

## 2016-07-10 DIAGNOSIS — H409 Unspecified glaucoma: Secondary | ICD-10-CM | POA: Insufficient documentation

## 2016-07-10 NOTE — Progress Notes (Signed)
Daily Session Note  Patient Details  Name: Jessica Bowman MRN: [id number] Date of Birth: 04-06-1941 Referring Provider:   Arn Medal Row Documentation from 04/29/2016 in [facility]  Referring Provider  Dr. Raul Del      Encounter Date: 07/10/2016  Check In:     Session Check In - 07/10/16 1442      Check-In   Location [facility]-Cardiac & Pulmonary Rehab   Staff Present Nyoka Cowden, RN, BSN, Willette Pa, MA, ACSM RCEP, Exercise Physiologist;Laureen Janell Quiet, RRT, Respiratory Therapist   Supervising physician immediately available to respond to emergencies [facility] immediately available ER MD   Physician(s) Drs. Alfred Levins and Telford   Medication changes reported     No   Fall or balance concerns reported    No   Warm-up and Cool-down Performed on first and last piece of equipment   Resistance Training Performed Yes   VAD Patient? No     Pain Assessment   Currently in Pain? No/denies   Multiple Pain Sites No         Goals Met:  Proper associated with RPD/PD & O2 Sat Independence with exercise equipment Exercise tolerated well Strength training completed today  Goals Unmet:  Not Applicable  Comments: Pt able to follow exercise prescription today without complaint.  Will continue to monitor for progression.    Dr. Emily Filbert is Medical Director for [place] and [facility].

## 2016-07-12 ENCOUNTER — Encounter: Payer: Medicare Other | Admitting: Respiratory Therapy

## 2016-07-12 DIAGNOSIS — J449 Chronic obstructive pulmonary disease, unspecified: Secondary | ICD-10-CM | POA: Diagnosis not present

## 2016-07-12 NOTE — Progress Notes (Signed)
Daily Session Note  Patient Details  Name: [name] MRN: [id number] Date of Birth: 03-21-1941 Referring Provider:   Arn Medal Row Documentation from 04/29/2016 in [facility] Cardiac and Pulmonary Rehab  Referring Provider  Dr. Raul Del      Encounter Date: 07/12/2016  Check In:     Session Check In - 07/12/16 1153      Check-In   Location [facility]-Cardiac & Pulmonary Rehab   Staff Present Alberteen Sam, MA, ACSM RCEP, Exercise Physiologist;Amanda Oletta Darter, BA, ACSM CEP, Exercise Physiologist;Stacey Blanch Media, RRT, RCP, Respiratory Therapist   Supervising physician immediately available to respond to emergencies [facility] immediately available ER MD   Physician(s) Drs. Schaevitz and Archie Balboa   Medication changes reported     No   Fall or balance concerns reported    No   Warm-up and Cool-down Performed as group-led Location manager Performed Yes   VAD Patient? No     Pain Assessment   Currently in Pain? No/denies   Multiple Pain Sites No         Goals Met:  Proper associated with RPD/PD & O2 Sat Independence with exercise equipment Using PLB without cueing & demonstrates good technique Exercise tolerated well Strength training completed today  Goals Unmet:  Not Applicable  Comments: Pt able to follow exercise prescription today without complaint.  Will continue to monitor for progression.    Dr. Emily Filbert is Medical Director for [place] and [facility].

## 2016-07-17 ENCOUNTER — Encounter: Payer: Medicare Other | Admitting: *Deleted

## 2016-07-17 ENCOUNTER — Encounter: Payer: Self-pay | Admitting: Respiratory Therapy

## 2016-07-17 DIAGNOSIS — J449 Chronic obstructive pulmonary disease, unspecified: Secondary | ICD-10-CM | POA: Diagnosis not present

## 2016-07-17 NOTE — Progress Notes (Signed)
Pulmonary Individual Treatment Plan  Patient Details  Name: Jessica Bowman MRN: [id number] Date of Birth: 1941/03/13 Referring Provider:   Arn Medal Row Documentation from 04/29/2016 in [facility] Cardiac and Pulmonary Rehab  Referring Provider  Dr. Raul Del      Initial Encounter Date:  Flowsheet Row Documentation from 04/29/2016 in [facility] Cardiac and Pulmonary Rehab  Date  01/09/16  Referring Provider  Dr. Raul Del      Visit Diagnosis: COPD, moderate ([place])  Patient's Home Medications on Admission:  Current Outpatient Prescriptions:    albuterol (PROAIR HFA) 108 (90 Base) MCG/ACT inhaler, Inhale 2 puffs into the lungs See admin instructions. Inhale 2 puffs every 4 to 6 hours as needed for shortness of breath/wheezing., Disp: , Rfl:    aspirin EC 81 MG tablet, Take 81 mg by mouth at bedtime. , Disp: , Rfl:    Cholecalciferol (VITAMIN D3) 1000 units CAPS, Take 1,000 Units by mouth daily. , Disp: , Rfl:    cyclobenzaprine (FLEXERIL) 5 MG tablet, Take 5 mg by mouth daily as needed for muscle spasms. , Disp: , Rfl:    escitalopram (LEXAPRO) 10 MG tablet, Take 5-10 mg by mouth See admin instructions. Take 1/2 tablet  (5 mg) by mouth every morning, and take 1 tablet by mouth every night at bedtime., Disp: , Rfl:    fluticasone furoate-vilanterol (BREO ELLIPTA) 100-25 MCG/INH AEPB, Inhale 1 puff into the lungs daily. , Disp: , Rfl:    ipratropium-albuterol (DUONEB) 0.5-2.5 (3) MG/3ML SOLN, Take 3 mLs by nebulization 2 (two) times daily as needed. For shortness of breath/wheezing., Disp: , Rfl:    irbesartan-hydrochlorothiazide (AVALIDE) 150-12.5 MG tablet, Take 0.5 tablets by mouth daily. , Disp: , Rfl:    LUMIGAN 0.01 % SOLN, Place 1 drop into both eyes at bedtime., Disp: , Rfl:    Melatonin 5 MG TABS, Take 5 mg by mouth at bedtime as needed. For sleep., Disp: , Rfl:    Multiple Vitamins-Minerals (STRESSTABS ADVANCED) TABS, Take 1 tablet by mouth daily., Disp: , Rfl:    Probiotic Product  ([place]), Take 1 capsule by mouth daily. , Disp: , Rfl:   Past Medical History: Past Medical History:  Diagnosis Date   COPD (chronic obstructive pulmonary disease) ([place])    Glaucoma    Hypertension    Osteoporosis     Tobacco Use: History  Smoking Status   Former Smoker   Quit date: 04/20/2012  Smokeless Tobacco   Not on file    Labs: Recent Review Flowsheet Data    There is no flowsheet data to display.       ADL UCSD:   Pulmonary Function Assessment:   Exercise Target Goals:    Exercise Program Goal: Individual exercise prescription set with THRR, safety & activity barriers. Participant demonstrates ability to understand and report RPE using BORG scale, to self-measure pulse accurately, and to acknowledge the importance of the exercise prescription.  Exercise Prescription Goal: Starting with aerobic activity 30 plus minutes a day, 3 days per week for initial exercise prescription. Provide home exercise prescription and guidelines that participant acknowledges understanding prior to discharge.  Activity Barriers & Risk Stratification:   6 Minute Walk:   Initial Exercise Prescription:     Initial Exercise Prescription - 04/29/16 1400      Date of Initial Exercise RX and Referring Provider   Date 01/09/16   Referring Provider Dr. Raul Del      Perform Capillary Blood Glucose checks as needed.  Exercise Prescription  Changes:     Exercise Prescription Changes    Row Name 01/24/16 1500 03/15/16 1300 03/29/16 1200 04/05/16 1200 04/19/16 1130     Response to Exercise   Blood Pressure (Admit) 122/72  --  --  -- 142/70   Blood Pressure (Exercise) 140/80  --  --  -- 122/60   Blood Pressure (Exit) 122/60  --  --  -- 120/60   Heart Rate (Admit) 106 bpm  --  --  -- 115 bpm   Heart Rate (Exercise) 113 bpm  --  --  -- 118 bpm   Heart Rate (Exit) 104 bpm  --  --  -- 97 bpm   Oxygen Saturation (Admit) 97 %  --  --  -- 97 %   Oxygen  Saturation (Exercise) 90 %  --  --  -- 95 %   Oxygen Saturation (Exit) 97 %  --  --  -- 97 %   Rating of Perceived Exertion (Exercise) 13  --  --  -- 13   Perceived Dyspnea (Exercise) 3  --  --  -- 4   Symptoms none  --  --  -- mild headache   Comments Jessica Bowman's first day of exercise in [facility]. Met goals and tolerated exercise well.  --  --  --  --   Duration Progress to 50 minutes of aerobic without signs/symptoms of physical distress  --  --  --  --   Intensity Rest + 30  --  --  -- THRR New  119-137     Progression   Progression  -- Continue progressive overload as per policy without signs/symptoms or physical distress. Continue progressive overload as per policy without signs/symptoms or physical distress. Continue progressive overload as per policy without signs/symptoms or physical distress. Continue to progress workloads to maintain intensity without signs/symptoms of physical distress.     Resistance Training   Training Prescription No  --  -- Yes Yes   Weight  --  --  -- 3 3   Reps  --  --  -- 10-12 10-12     Treadmill   MPH 1.2 1.2 1.2 1.3  --   Grade 0 0 0 0  --   Minutes '11 12 12 15  '$ --     NuStep   Level  --  -- 2 2  --   Watts  --  -- 40 25  --   Minutes  --  -- 15 15  --     T5 Nustep   Level 1  --  --  --  --   Watts 15  --  --  --  --   Minutes 10  --  --  --  --     Biostep-RELP   Level '2 2 2 2  '$ --   Watts 40 '20 20 20  '$ --   Minutes '10 10 15 15  '$ --     Home Exercise Plan   Frequency Add 2 additional days to program exercise sessions.  --  --  --  --   Row Name 07/17/16 1300             Exercise Review   Progression Yes         Response to Exercise   Blood Pressure (Exercise) 118/60       Blood Pressure (Exit) 120/70       Heart Rate (Admit) 83 bpm       Heart Rate (Exercise) 124 bpm  Heart Rate (Exit) 91 bpm       Oxygen Saturation (Admit) 94 %       Oxygen Saturation (Exercise) 94 %       Oxygen Saturation (Exit) 95 %       Rating  of Perceived Exertion (Exercise) 14       Perceived Dyspnea (Exercise) 4       Intensity THRR unchanged         Progression   Progression Continue to progress workloads to maintain intensity without signs/symptoms of physical distress.         Resistance Training   Training Prescription Yes       Weight 3       Reps 10-12         Interval Training   Interval Training No         Treadmill   MPH 1.3       Grade 0       Minutes 15         NuStep   Level 3       Minutes 15         Biostep-RELP   Level 3       Minutes 15       METs 2          Exercise Comments:     Exercise Comments    Row Name 04/24/16 1212 05/22/16 1348 06/06/16 1417 06/21/16 1020 07/04/16 1309   Exercise Comments Jessica Mohammad has been on a heart monitor and may have a cath per phone call to Laureen - she will return when cleared to exercise and exercise will be modified if needed. Jessica Simerson last visit was 04/19/16 Jessica Wigley last visit was 04/19/16.  She has an appointment with cardiologist 8/1 and will get clearance befor ereturning to [place]. Jessica Kahler has not attended since 04/19/16 Jessica Wire has not attended [facility] since 04/19/16.   [place] Name 07/10/16 1444 07/17/16 1342         Exercise Comments Jailah returned today without any problems. Arionne has done well her first 2 days back to [facility].         Discharge Exercise Prescription (Final Exercise Prescription Changes):     Exercise Prescription Changes - 07/17/16 1300      Exercise Review   Progression Yes     Response to Exercise   Blood Pressure (Exercise) 118/60   Blood Pressure (Exit) 120/70   Heart Rate (Admit) 83 bpm   Heart Rate (Exercise) 124 bpm   Heart Rate (Exit) 91 bpm   Oxygen Saturation (Admit) 94 %   Oxygen Saturation (Exercise) 94 %   Oxygen Saturation (Exit) 95 %   Rating of Perceived Exertion (Exercise) 14   Perceived Dyspnea (Exercise) 4   Intensity THRR unchanged     Progression   Progression Continue to progress  workloads to maintain intensity without signs/symptoms of physical distress.     Resistance Training   Training Prescription Yes   Weight 3   Reps 10-12     Interval Training   Interval Training No     Treadmill   MPH 1.3   Grade 0   Minutes 15     NuStep   Level 3   Minutes 15     Biostep-RELP   Level 3   Minutes 15   METs 2       Nutrition:  Target Goals: Understanding of nutrition guidelines, daily intake of sodium '1500mg'$ ,  cholesterol 200mg , calories 30% from fat and 7% or less from saturated fats, daily to have 5 or more servings of fruits and vegetables.  Biometrics:    Nutrition Therapy Plan and Nutrition Goals:   Nutrition Discharge: Rate Your Plate Scores:   Psychosocial: Target Goals: Acknowledge presence or absence of depression, maximize coping skills, provide positive support system. Participant is able to verbalize types and ability to use techniques and skills needed for reducing stress and depression.  Initial Review & Psychosocial Screening:   Quality of Life Scores:   PHQ-9: Recent Review Flowsheet Data    Depression screen [facility] 2/9 01/09/2016   Decreased Interest 2   Down, Depressed, Hopeless 1   PHQ - 2 Score 3   Altered sleeping 2   Tired, decreased energy 2   Change in appetite 1   Feeling bad or failure about yourself  0   Trouble concentrating 0   Moving slowly or fidgety/restless 0   Suicidal thoughts 0   PHQ-9 Score 8   Difficult doing work/chores Not difficult at all      Psychosocial Evaluation and Intervention:     Psychosocial Evaluation - 04/08/16 1249      Psychosocial Evaluation & Interventions   Interventions Encouraged to exercise with the program and follow exercise prescription;Relaxation education;Stress management education   Comments Counselor met with Jessica. Marszalek today for initial psychosocial evalution.  She is a 74 yr old who was diagnosed with COPD in 2014.  Jessica. Cappiello lives alone since the death of  her spouse this past December from lung cancer.  She became very sedentary during that time and her health issues increased as a result.  She has multiple health issues including vision & digestive  problems as well as anxiety .  She reports taking medications for all of these - including the anxiety and that they are currently being effective.  Jessica. Waltermire states she has trouble getting to sleep, but sleeps at least 7-8 hours per night.  She states this program has already improved her sleep slightly and increased her ability to do normal activities.  Jessica. Penny is typically in a positive mood and continues to have anxiety in new situations primarily.  She has an appointment with a neurologist in the next few weeks to assess why she fainted twice over the past several months.  Jessica. Koval has goals to be more fit and more active.  She plans to look into follow up programs to maintain her progress upon completion of this program.    Continued Psychosocial Services Needed Yes  Jessica. Suen will benefit from the psychoeducational components of this program, especially anxiety and relaxation.        Psychosocial Re-Evaluation:     Psychosocial Re-Evaluation    Row Name 06/24/16 [phone]             Psychosocial Re-Evaluation   Comments Jessica Duling has had medical issues and has not attended since 04/19/2016. IN talking with her several times, she states she is looking forward to returning to [facility] for it has been so encouraging for her both mentally and physically.         Education: Education Goals: Education classes will be provided on a weekly basis, covering required topics. Participant will state understanding/return demonstration of topics presented.  Learning Barriers/Preferences:   Education Topics: Initial Evaluation Education: - Verbal, written and demonstration of respiratory meds, RPE/PD scales, oximetry and breathing techniques. Instruction on use of nebulizers and MDIs: cleaning  and proper use, rinsing mouth with steroid doses and importance of monitoring MDI activations. Flowsheet Row Pulmonary Rehab from 07/12/2016 in [facility] Cardiac and Pulmonary Rehab  Date  01/09/16  Educator  LB  Instruction Review Code  2- meets goals/outcomes      General Nutrition Guidelines/Fats and Fiber: -Group instruction provided by verbal, written material, models and posters to present the general guidelines for heart healthy nutrition. Gives an explanation and review of dietary fats and fiber.   Controlling Sodium/Reading Food Labels: -Group verbal and written material supporting the discussion of sodium use in heart healthy nutrition. Review and explanation with models, verbal and written materials for utilization of the food label.   Exercise Physiology & Risk Factors: - Group verbal and written instruction with models to review the exercise physiology of the cardiovascular system and associated critical values. Details cardiovascular disease risk factors and the goals associated with each risk factor. Flowsheet Row Pulmonary Rehab from 07/12/2016 in [facility] Cardiac and Pulmonary Rehab  Date  07/10/16  Educator  AS  Instruction Review Code  2- meets goals/outcomes      Aerobic Exercise & Resistance Training: - Gives group verbal and written discussion on the health impact of inactivity. On the components of aerobic and resistive training programs and the benefits of this training and how to safely progress through these programs.   Flexibility, Balance, General Exercise Guidelines: - Provides group verbal and written instruction on the benefits of flexibility and balance training programs. Provides general exercise guidelines with specific guidelines to those with heart or lung disease. Demonstration and skill practice provided. Flowsheet Row Pulmonary Rehab from 07/12/2016 in [facility] Cardiac and Pulmonary Rehab  Date  03/27/16  Educator  Salley Hews, PT`  Instruction Review Code   2- meets goals/outcomes      Stress Management: - Provides group verbal and written instruction about the health risks of elevated stress, cause of high stress, and healthy ways to reduce stress.   Depression: - Provides group verbal and written instruction on the correlation between heart/lung disease and depressed mood, treatment options, and the stigmas associated with seeking treatment.   Exercise & Equipment Safety: - Individual verbal instruction and demonstration of equipment use and safety with use of the equipment.   Infection Prevention: - Provides verbal and written material to individual with discussion of infection control including proper hand washing and proper equipment cleaning during exercise session.   Falls Prevention: - Provides verbal and written material to individual with discussion of falls prevention and safety. Flowsheet Row Pulmonary Rehab from 07/12/2016 in [facility] Cardiac and Pulmonary Rehab  Date  01/09/16  Educator  LB  Instruction Review Code  2- meets goals/outcomes      Diabetes: - Individual verbal and written instruction to review signs/symptoms of diabetes, desired ranges of glucose level fasting, after meals and with exercise. Advice that pre and post exercise glucose checks will be done for 3 sessions at entry of program.   Chronic Lung Diseases: - Group verbal and written instruction to review new updates, new respiratory medications, new advancements in procedures and treatments. Provide informative websites and "800" numbers of self-education.   Lung Procedures: - Group verbal and written instruction to describe testing methods done to diagnose lung disease. Review the outcome of test results. Describe the treatment choices: Pulmonary Function Tests, ABGs and oximetry. Flowsheet Row Pulmonary Rehab from 07/12/2016 in [facility] Cardiac and Pulmonary Rehab  Date  04/12/16  Educator  [place]  Instruction Review Code  2-  meets goals/outcomes       Energy Conservation: - Provide group verbal and written instruction for methods to conserve energy, plan and organize activities. Instruct on pacing techniques, use of adaptive equipment and posture/positioning to relieve shortness of breath.   Triggers: - Group verbal and written instruction to review types of environmental controls: home humidity, furnaces, filters, dust mite/pet prevention, HEPA vacuums. To discuss weather changes, air quality and the benefits of nasal washing.   Exacerbations: - Group verbal and written instruction to provide: warning signs, infection symptoms, calling MD promptly, preventive modes, and value of vaccinations. Review: effective airway clearance, coughing and/or vibration techniques. Create an Sports administrator.   Oxygen: - Individual and group verbal and written instruction on oxygen therapy. Includes supplement oxygen, available portable oxygen systems, continuous and intermittent flow rates, oxygen safety, concentrators, and Medicare reimbursement for oxygen.   Respiratory Medications: - Group verbal and written instruction to review medications for lung disease. Drug class, frequency, complications, importance of spacers, rinsing mouth after steroid MDI's, and proper cleaning methods for nebulizers. Flowsheet Row Pulmonary Rehab from 07/12/2016 in [facility] Cardiac and Pulmonary Rehab  Date  01/09/16  Educator  LB  Instruction Review Code  2- meets goals/outcomes      AED/CPR: - Group verbal and written instruction with the use of models to demonstrate the basic use of the AED with the basic ABC's of resuscitation. Flowsheet Row Pulmonary Rehab from 07/12/2016 in [facility] Cardiac and Pulmonary Rehab  Date  04/05/16  Educator  CE  Instruction Review Code  2- meets goals/outcomes      Breathing Retraining: - Provides individuals verbal and written instruction on purpose, frequency, and proper technique of diaphragmatic breathing and pursed-lipped  breathing. Applies individual practice skills. Flowsheet Row Pulmonary Rehab from 07/12/2016 in [facility] Cardiac and Pulmonary Rehab  Date  01/09/16  Educator  LB  Instruction Review Code  2- meets goals/outcomes      Anatomy and Physiology of the Lungs: - Group verbal and written instruction with the use of models to provide basic lung anatomy and physiology related to function, structure and complications of lung disease. Flowsheet Row Pulmonary Rehab from 07/12/2016 in [facility] Cardiac and Pulmonary Rehab  Date  07/12/16  Educator  [place]  Instruction Review Code  2- meets goals/outcomes      Heart Failure: - Group verbal and written instruction on the basics of heart failure: signs/symptoms, treatments, explanation of ejection fraction, enlarged heart and cardiomyopathy.   Sleep Apnea: - Individual verbal and written instruction to review Obstructive Sleep Apnea. Review of risk factors, methods for diagnosing and types of masks and machines for OSA.   Anxiety: - Provides group, verbal and written instruction on the correlation between heart/lung disease and anxiety, treatment options, and management of anxiety.   Relaxation: - Provides group, verbal and written instruction about the benefits of relaxation for patients with heart/lung disease. Also provides patients with examples of relaxation techniques. Flowsheet Row Pulmonary Rehab from 07/12/2016 in [facility] Cardiac and Pulmonary Rehab  Date  04/17/16  Educator  Elissa Hefty  Instruction Review Code  2- Meets goals/outcomes      Knowledge Questionnaire Score:    Core Components/Risk Factors/Patient Goals at Admission:   Core Components/Risk Factors/Patient Goals Review:      Goals and Risk Factor Review    Row Name 01/24/16 1555 03/27/16 1457 04/08/16 1254 04/12/16 1242 04/12/16 1515     Core Components/Risk Factors/Patient Goals Review   Personal Goals Review Develop more efficient breathing  techniques such as purse lipped  breathing and diaphragmatic breathing and practicing self-pacing with activity. Sedentary;Develop more efficient breathing techniques such as purse lipped breathing and diaphragmatic breathing and practicing self-pacing with activity.;Improve shortness of breath with ADL's;Increase knowledge of respiratory medications and ability to use respiratory devices properly. Sedentary;Increase Strength and Stamina;Improve shortness of breath with ADL's  -- Develop more efficient breathing techniques such as purse lipped breathing and diaphragmatic breathing and practicing self-pacing with activity.   Review Reviewed PLB technique and qued her to use with exercise goals Jessica Bendavid is improving her stamina. She has not started back to the bike water exercising, but plans to return to that after [facility]. She paces herself at home with activites tto minimalize her shortness of breath and uses PLB with her exercise goals. We discussed her MDI's and was given an aerochamber for her Proventil. I will check on the Breo  to see if appropriate tto use a spacer with this inhaler. [facility] informed staff today that yesterday she reached a milestone.  She was able to make her bed without stopping whereas before [facility] she would have to stop at least two times.  She stated she was really happy yesterday about this improvement/change.  Also she usually dumps cloths on the bed after getting out fo dryer.  Now she is able to fold them up too.  Not able to put away yet., but stated she is happy she is able to fold colthes up now.  She noticed that she is able to do more housework and able to breathe easier.  Aminta states she is unable to walk at home due to balance issues.  She is scheduled to see a Neurologist in the near future about her balance.  Azile reports using 3 pound weights at home.  She states she enjoyes working out with weights and is feeling stronger.   Adean blood pressure is stable . c/o heart rate 100 at home just walking.  Heart rate upon arrival is 110 most days. Otila says she drinks a lot of water every day so she doesn't feel she is dehydrated.  I suggested she talk to her MD since she has not had her thyroid level checked lately. Reannah reports she had a monitor on in Jan or Feb but she wasn't told by her MD that it showed anything. She drinks decaf coffee only in am but on albuteral. Michelene also chews Nicotine gum since even thought she quit smoking 3 years ago she reports "I wake up every morning craving a cigarette this long".  Reviewed PLB with Jessica. [place].  She has good technique when performing PLB and knows when to use it.    Expected Outcomes After one week, independently using PLB with her exercise goals.  -- Continue with [facility] prescription in order to achieve her goals; improve her stamina; increase her activities; improve her energy/stamina; and decrease her SOB.   Continue using PLB with ADLS and Exercise.   Heart rate 80-90. Blood pressure cont to be stable.  This will decrease her WOB and SOB while exercising and performing ADLs.   [place] Name 04/19/16 1217 04/19/16 1243 07/12/16 1455         Core Components/Risk Factors/Patient Goals Review   Personal Goals Review (P)  Increase knowledge of respiratory medications and ability to use respiratory devices properly. Increase knowledge of respiratory medications and ability to use respiratory devices properly. Hypertension;Sedentary;Increase Strength and Stamina     Review  -- Reviewed medications with Jessica.  [place]is[place].  She takes Albuterol, Breo and Duoneb at home.  She has a spacer for ther MDI's.  She is taking her medications as prescribed and does not have any new questions or concerns with her medications. Shamila has been out for 3 months and states it is hard starting back.  Bps have been good in [facility] classes.     Expected Outcomes  -- Taking her medications as prescribed will decrease her exacerabations and SOB. Tyteanna will continue to improve strength and  stamina with regular participation in [place].        Core Components/Risk Factors/Patient Goals at Discharge (Final Review):      Goals and Risk Factor Review - 07/12/16 1455      Core Components/Risk Factors/Patient Goals Review   Personal Goals Review Hypertension;Sedentary;Increase Strength and Stamina   Review Milicent has been out for 3 months and states it is hard starting back.  Bps have been good in [facility] classes.   Expected Outcomes Elverta will continue to improve strength and stamina with regular participation in [place].      ITP Comments:     ITP Comments    Row Name 01/24/16 1155 04/12/16 1237 05/22/16 1348 06/03/16 0745 06/06/16 1418   ITP Comments Lakaisha should complete her goals by 36th session. Lezley c/o heart rate 100 at home just walking. I suggested she talk to her MD since she has not had her thyroid level checked lately. Alira reports she had a monitor on in Jan or Feb but she wasn't told by her MD that it showed anything. She drinks decaf coffee only in am but on albuteral. Money also chews Nicotine gum since even thought she quit smoking 3 years ago she reports "I wake up every morning craving a cigarette this long".  Jessica Gamm last visit was 04/19/16 Jessica Camero called on 05/30/2016 to update her medical issues: she has just got off her 30 day heart monitor; she has an appointment with her cardiologist on 06/18/2016; and then she will know when she can return to [place]. Jessica Dusing last visit was 04/19/16.  She has an appointment with cardiologist 8/1 and will get clearance befor ereturning to [place].   [place] Name 06/21/16 1021 07/04/16 1309         ITP Comments Jessica Tandy has not attended since 04/19/16 Jessica Mclees has not attended [facility] since 04/19/16.         Comments: Jessica Mosher had a face to face meeting with Dr Emily Filbert, Micro Director, today.

## 2016-07-17 NOTE — Progress Notes (Signed)
Daily Session Note  Patient Details  Name: CLARY BOULAIS MRN: [id number] Date of Birth: [birth year] Referring Provider:   Arn Medal Row Documentation from 04/29/2016 in [facility]  Referring Provider  Dr. Raul Del      Encounter Date: 07/17/2016  Check In:     Session Check In - 07/17/16 1325      Check-In   Location [facility]-Cardiac & Pulmonary Rehab   Staff Present Heath Lark, RN, BSN, CCRP;Jessica Henlawson, MA, ACSM RCEP, Exercise Physiologist;Laureen Owens Shark, BS, RRT, Respiratory Therapist   Supervising physician immediately available to respond to emergencies [facility] immediately available ER MD   Physician(s) Drs. Karma Greaser and Alfred Levins   Medication changes reported     No   Fall or balance concerns reported    No   Warm-up and Cool-down Performed as group-led Location manager Performed Yes   VAD Patient? No     VAD patient   Has back up controller? No     Pain Assessment   Currently in Pain? No/denies         Goals Met:  Proper associated with RPD/PD & O2 Sat Independence with exercise equipment Using PLB without cueing & demonstrates good technique Exercise tolerated well Strength training completed today Face to Face completed with Medical Director  Goals Unmet:  Not Applicable  Comments: Pt able to follow exercise prescription today without complaint.  Will continue to monitor for progression.    Dr. Emily Filbert is Medical Director for [place] and [facility].

## 2016-07-19 ENCOUNTER — Encounter: Payer: Medicare Other | Attending: Specialist | Admitting: *Deleted

## 2016-07-19 DIAGNOSIS — I10 Essential (primary) hypertension: Secondary | ICD-10-CM | POA: Diagnosis not present

## 2016-07-19 DIAGNOSIS — H409 Unspecified glaucoma: Secondary | ICD-10-CM | POA: Diagnosis not present

## 2016-07-19 DIAGNOSIS — J449 Chronic obstructive pulmonary disease, unspecified: Secondary | ICD-10-CM | POA: Diagnosis not present

## 2016-07-19 DIAGNOSIS — M810 Age-related osteoporosis without current pathological fracture: Secondary | ICD-10-CM | POA: Insufficient documentation

## 2016-07-19 NOTE — Progress Notes (Signed)
Daily Session Note  Patient Details  Name: Jessica Bowman MRN: [id number] Date of Birth: 1941-08-08 Referring Provider:   Arn Medal Row Documentation from 04/29/2016 in [facility] Re[facility]  Referring Provider  Dr. Raul Del      Encounter Date: 07/19/2016  Check In:     Session Check In - 07/19/16 1149      Check-In   Location [facility]-Cardiac & Pulmonary Re[facility]   Staff Present Alberteen Sam, MA, ACSM RCEP, Exercise Physiologist;Amanda Oletta Darter, BA, ACSM CEP, Exercise Physiologist;Stacey Blanch Media, RRT, RCP, Respiratory Therapist   Supervising physician immediately available to respond to emergencies [facility] immediately available ER MD   Physician(s) Drs. Jimmye Norman and Archie Balboa   Medication changes reported     No   Fall or balance concerns reported    No   Warm-up and Cool-down Performed as group-led Location manager Performed Yes   VAD Patient? No     VAD patient   Has back up controller? No     Pain Assessment   Currently in Pain? No/denies   Multiple Pain Sites No         Goals Met:  Proper associated with RPD/PD & O2 Sat Independence with exercise equipment Using PLB without cueing & demonstrates good technique Exercise tolerated well Strength training completed today  Goals Unmet:  Not Applicable  Comments: Pt able to follow exercise prescription today without complaint.  Will continue to monitor for progression.    Dr. Emily Filbert is Medical Director for [place] and [facility].

## 2016-07-23 ENCOUNTER — Encounter: Payer: Self-pay | Admitting: Respiratory Therapy

## 2016-07-23 NOTE — Progress Notes (Signed)
Pulmonary Individual Treatment Plan  Patient Details  Name: CHARLEY LAFRANCE MRN: [id number] Date of Birth: 08-27-1941 Referring Provider:   Arn Medal Row Documentation from 04/29/2016 in  [facility] Cardiac and Pulmonary Rehab  Referring Provider  Dr. Raul Del      Initial Encounter Date:  Flowsheet Row Documentation from 04/29/2016 in [facility] Cardiac and Pulmonary Rehab  Date  01/09/16  Referring Provider  Dr. Raul Del      Visit Diagnosis: No diagnosis found.  Patient's Home Medications on Admission:  Current Outpatient Prescriptions:    albuterol (PROAIR HFA) 108 (90 Base) MCG/ACT inhaler, Inhale 2 puffs into the lungs See admin instructions. Inhale 2 puffs every 4 to 6 hours as needed for shortness of breath/wheezing., Disp: , Rfl:    aspirin EC 81 MG tablet, Take 81 mg by mouth at bedtime. , Disp: , Rfl:    Cholecalciferol (VITAMIN D3) 1000 units CAPS, Take 1,000 Units by mouth daily. , Disp: , Rfl:    cyclobenzaprine (FLEXERIL) 5 MG tablet, Take 5 mg by mouth daily as needed for muscle spasms. , Disp: , Rfl:    escitalopram (LEXAPRO) 10 MG tablet, Take 5-10 mg by mouth See admin instructions. Take 1/2 tablet  (5 mg) by mouth every morning, and take 1 tablet by mouth every night at bedtime., Disp: , Rfl:    fluticasone furoate-vilanterol (BREO ELLIPTA) 100-25 MCG/INH AEPB, Inhale 1 puff into the lungs daily. , Disp: , Rfl:    ipratropium-albuterol (DUONEB) 0.5-2.5 (3) MG/3ML SOLN, Take 3 mLs by nebulization 2 (two) times daily as needed. For shortness of breath/wheezing., Disp: , Rfl:    irbesartan-hydrochlorothiazide (AVALIDE) 150-12.5 MG tablet, Take 0.5 tablets by mouth daily. , Disp: , Rfl:    LUMIGAN 0.01 % SOLN, Place 1 drop into both eyes at bedtime., Disp: , Rfl:    Melatonin 5 MG TABS, Take 5 mg by mouth at bedtime as needed. For sleep., Disp: , Rfl:    Multiple Vitamins-Minerals (STRESSTABS ADVANCED) TABS, Take 1 tablet by mouth daily., Disp: , Rfl:    Probiotic Product  (Tatamy), Take 1 capsule by mouth daily. , Disp: , Rfl:   Past Medical History: Past Medical History:  Diagnosis Date   COPD (chronic obstructive pulmonary disease) ([place])    Glaucoma    Hypertension    Osteoporosis     Tobacco Use: History  Smoking Status   Former Smoker   Quit date: 04/20/2012  Smokeless Tobacco   Not on file    Labs: Recent Review Flowsheet Data    There is no flowsheet data to display.       ADL UCSD:   Pulmonary Function Assessment:   Exercise Target Goals:    Exercise Program Goal: Individual exercise prescription set with THRR, safety & activity barriers. Participant demonstrates ability to understand and report RPE using BORG scale, to self-measure pulse accurately, and to acknowledge the importance of the exercise prescription.  Exercise Prescription Goal: Starting with aerobic activity 30 plus minutes a day, 3 days per week for initial exercise prescription. Provide home exercise prescription and guidelines that participant acknowledges understanding prior to discharge.  Activity Barriers & Risk Stratification:   6 Minute Walk:   Initial Exercise Prescription:     Initial Exercise Prescription - 04/29/16 1400      Date of Initial Exercise RX and Referring Provider   Date 01/09/16   Referring Provider Dr. Raul Del      Perform Capillary Blood Glucose checks as needed.  Exercise Prescription  Changes:     Exercise Prescription Changes    Row Name 03/15/16 1300 03/29/16 1200 04/05/16 1200 04/19/16 1130 07/17/16 1300     Exercise Review   Progression  --  --  --  -- Yes     Response to Exercise   Blood Pressure (Admit)  --  --  -- 142/70  --   Blood Pressure (Exercise)  --  --  -- 122/60 118/60   Blood Pressure (Exit)  --  --  -- 120/60 120/70   Heart Rate (Admit)  --  --  -- 115 bpm 83 bpm   Heart Rate (Exercise)  --  --  -- 118 bpm 124 bpm   Heart Rate (Exit)  --  --  -- 97 bpm 91 bpm   Oxygen  Saturation (Admit)  --  --  -- 97 % 94 %   Oxygen Saturation (Exercise)  --  --  -- 95 % 94 %   Oxygen Saturation (Exit)  --  --  -- 97 % 95 %   Rating of Perceived Exertion (Exercise)  --  --  -- 13 14   Perceived Dyspnea (Exercise)  --  --  -- 4 4   Symptoms  --  --  -- mild headache  --   Intensity  --  --  -- THRR New  119-137 THRR unchanged     Progression   Progression Continue progressive overload as per policy without signs/symptoms or physical distress. Continue progressive overload as per policy without signs/symptoms or physical distress. Continue progressive overload as per policy without signs/symptoms or physical distress. Continue to progress workloads to maintain intensity without signs/symptoms of physical distress. Continue to progress workloads to maintain intensity without signs/symptoms of physical distress.     Resistance Training   Training Prescription  --  -- Yes Yes Yes   Weight  --  -- _0 Reps  --  -- 10-12 10-12 10-12     Interval Training   Interval Training  --  --  --  -- No     Treadmill   MPH 1.2 1.2 1.3  -- 1.3   Grade 0 0 0  -- 0   Minutes _1 -- 15     NuStep   Level  -- 2 2  -- 3   Watts  -- 40 25  --  --   Minutes  -- 15 15  -- 15     Biostep-RELP   Level _2 -- 3   Watts _3 --  --   Minutes _4 -- 15   METs  --  --  --  -- 2      Exercise Comments:     Exercise Comments    Row Name 04/24/16 1212 05/22/16 1348 06/06/16 1417 06/21/16 1020 07/04/16 1309   Exercise Comments Ms Hazelbaker has been on a heart monitor and may have a cath per phone call to Laureen - she will return when cleared to exercise and exercise will be modified if needed. Ms Veith last visit was 04/19/16 Ms Febres last visit was 04/19/16.  She has an appointment with cardiologist 8/1 and will get clearance befor ereturning to [place]. Ms Lamphear has not attended since 04/19/16 Ms Wildes has not attended [facility] since 04/19/16.   [place] Name 07/10/16  1444 07/17/16 1342         Exercise Comments Stanton Kidney returned today without  any problems. Dayane has done well her first 2 days back to [facility].         Discharge Exercise Prescription (Final Exercise Prescription Changes):     Exercise Prescription Changes - 07/17/16 1300      Exercise Review   Progression Yes     Response to Exercise   Blood Pressure (Exercise) 118/60   Blood Pressure (Exit) 120/70   Heart Rate (Admit) 83 bpm   Heart Rate (Exercise) 124 bpm   Heart Rate (Exit) 91 bpm   Oxygen Saturation (Admit) 94 %   Oxygen Saturation (Exercise) 94 %   Oxygen Saturation (Exit) 95 %   Rating of Perceived Exertion (Exercise) 14   Perceived Dyspnea (Exercise) 4   Intensity THRR unchanged     Progression   Progression Continue to progress workloads to maintain intensity without signs/symptoms of physical distress.     Resistance Training   Training Prescription Yes   Weight 3   Reps 10-12     Interval Training   Interval Training No     Treadmill   MPH 1.3   Grade 0   Minutes 15     NuStep   Level 3   Minutes 15     Biostep-RELP   Level 3   Minutes 15   METs 2       Nutrition:  Target Goals: Understanding of nutrition guidelines, daily intake of sodium <1518m, cholesterol <2061m calories 30% from fat and 7% or less from saturated fats, daily to have 5 or more servings of fruits and vegetables.  Biometrics:    Nutrition Therapy Plan and Nutrition Goals:   Nutrition Discharge: Rate Your Plate Scores:   Psychosocial: Target Goals: Acknowledge presence or absence of depression, maximize coping skills, provide positive support system. Participant is able to verbalize types and ability to use techniques and skills needed for reducing stress and depression.  Initial Review & Psychosocial Screening:   Quality of Life Scores:   PHQ-9: Recent Review Flowsheet Data    [facility] 01[facility]   Decreased Interest 2   Down, Depressed,  Hopeless 1   PHQ - 2 Score 3   Altered sleeping 2   Tired, decreased energy 2   Change in appetite 1   Feeling bad or failure about yourself  0   Trouble concentrating 0   Moving slowly or fidgety/restless 0   Suicidal thoughts 0   PHQ-9 Score 8   Difficult doing work/chores Not difficult at all      Psychosocial Evaluation and Intervention:     Psychosocial Evaluation - 04/08/16 1249      Psychosocial Evaluation & Interventions   Interventions Encouraged to exercise with the program and follow exercise prescription;Relaxation education;Stress management education   Comments Counselor met with Ms. JoDavernoday for initial psychosocial evalution.  She is a [age]r old who was diagnosed with COPD in 2014.  Ms. JoStrohives alone since the death of her spouse this past DeDec 13, 2024rom lung cancer.  She became very sedentary during that time and her health issues increased as a result.  She has multiple health issues including vision & digestive  problems as well as anxiety .  She reports taking medications for all of these - including the anxiety and that they are currently being effective.  Ms. JoZiesmertates she has trouble getting to sleep, but sleeps at least 7-8 hours per night.  She states this program has already improved her sleep slightly and  increased her ability to do normal activities.  Ms. Rockers is typically in a positive mood and continues to have anxiety in new situations primarily.  She has an appointment with a neurologist in the next few weeks to assess why she fainted twice over the past several months.  Ms. Pfeifer has goals to be more fit and more active.  She plans to look into follow up programs to maintain her progress upon completion of this program.    Continued Psychosocial Services Needed Yes  Ms. Fisk will benefit from the psychoeducational components of this program, especially anxiety and relaxation.        Psychosocial Re-Evaluation:     Psychosocial  Re-Evaluation    Row Name 06/24/16 0854 07/17/16 1631           Psychosocial Re-Evaluation   Interventions  -- Encouraged to attend Pulmonary Rehabilitation for the exercise;Relaxation education;Stress management education      Comments Ms Olarte has had medical issues and has not attended since 04/19/2016. IN talking with her several times, she states she is looking forward to returning to [place] for it has been so encouraging for her both mentally and physically. Lekisha's psychosocial assessment reveals no barriers to participation in Pulmonary Rehab. Psychosocial areas that are currently affecting patient's rehab experience include concerns about treatment decisions, dealing with as evidenced by fear and worry about another syncopal episode occurring without warning.Stanton Kidney does continue to exhibit positive coping skills to deal with her psychosocial concerns. Offered emotional support and reassurance. Britteney does feel she is making progress toward Pulmonary Rehab goals, even though she has been out for several months because of syncopal episodes and evaluation. Tamarra reports her health and activity level HAS NOT improved in the past 30 days, since she has not been in the program for that time period.Marland Kitchenas evidenced by patient's report of unchanged ability to exercise. Patient reports feeling positive about current and projected progression in Pulmonary Rehab. After reviewing the patient's treatment plan, the patient is making progress toward Pulmonary Rehab goals. Patient's rate of progress toward rehab goals is good. Plan of action to help patient continue to work towards rehab goals include time with psychosocial counselor to review the fear of another episode and to look at coping mechanisms to help decrease fears. . Will continue to monitor and evaluate progress toward psychosocial goal(s).      Continued Psychosocial Services Needed  -- Yes        Education: Education Goals: Education classes will be  provided on a weekly basis, covering required topics. Participant will state understanding/return demonstration of topics presented.  Learning Barriers/Preferences:   Education Topics: Initial Evaluation Education: - Verbal, written and demonstration of respiratory meds, RPE/PD scales, oximetry and breathing techniques. Instruction on use of nebulizers and MDIs: cleaning and proper use, rinsing mouth with steroid doses and importance of monitoring MDI activations. Flowsheet Row Pulmonary Rehab from 07/19/2016 in [facility] Cardiac and Pulmonary Rehab  Date  01/09/16  Educator  LB  Instruction Review Code  2- meets goals/outcomes      General Nutrition Guidelines/Fats and Fiber: -Group instruction provided by verbal, written material, models and posters to present the general guidelines for heart healthy nutrition. Gives an explanation and review of dietary fats and fiber.   Controlling Sodium/Reading Food Labels: -Group verbal and written material supporting the discussion of sodium use in heart healthy nutrition. Review and explanation with models, verbal and written materials for utilization of the food label.   Exercise Physiology &  Risk Factors: - Group verbal and written instruction with models to review the exercise physiology of the cardiovascular system and associated critical values. Details cardiovascular disease risk factors and the goals associated with each risk factor. Flowsheet Row Pulmonary Rehab from 07/19/2016 in [facility] Cardiac and Pulmonary Rehab  Date  07/10/16  Educator  AS  Instruction Review Code  2- meets goals/outcomes      Aerobic Exercise & Resistance Training: - Gives group verbal and written discussion on the health impact of inactivity. On the components of aerobic and resistive training programs and the benefits of this training and how to safely progress through these programs.   Flexibility, Balance, General Exercise Guidelines: - Provides group verbal and  written instruction on the benefits of flexibility and balance training programs. Provides general exercise guidelines with specific guidelines to those with heart or lung disease. Demonstration and skill practice provided. Flowsheet Row Pulmonary Rehab from 07/19/2016 in [facility] Cardiac and Pulmonary Rehab  Date  03/27/16  Educator  Salley Hews, PT`  Instruction Review Code  2- meets goals/outcomes      Stress Management: - Provides group verbal and written instruction about the health risks of elevated stress, cause of high stress, and healthy ways to reduce stress.   Depression: - Provides group verbal and written instruction on the correlation between heart/lung disease and depressed mood, treatment options, and the stigmas associated with seeking treatment.   Exercise & Equipment Safety: - Individual verbal instruction and demonstration of equipment use and safety with use of the equipment.   Infection Prevention: - Provides verbal and written material to individual with discussion of infection control including proper hand washing and proper equipment cleaning during exercise session.   Falls Prevention: - Provides verbal and written material to individual with discussion of falls prevention and safety. Flowsheet Row Pulmonary Rehab from 07/19/2016 in [facility] Cardiac and Pulmonary Rehab  Date  01/09/16  Educator  LB  Instruction Review Code  2- meets goals/outcomes      Diabetes: - Individual verbal and written instruction to review signs/symptoms of diabetes, desired ranges of glucose level fasting, after meals and with exercise. Advice that pre and post exercise glucose checks will be done for 3 sessions at entry of program.   Chronic Lung Diseases: - Group verbal and written instruction to review new updates, new respiratory medications, new advancements in procedures and treatments. Provide informative websites and "800" numbers of self-education.   Lung Procedures: -  Group verbal and written instruction to describe testing methods done to diagnose lung disease. Review the outcome of test results. Describe the treatment choices: Pulmonary Function Tests, ABGs and oximetry. Flowsheet Row Pulmonary Rehab from 07/19/2016 in [facility] Cardiac and Pulmonary Rehab  Date  04/12/16  Educator  [place]  Instruction Review Code  2- meets goals/outcomes      Energy Conservation: - Provide group verbal and written instruction for methods to conserve energy, plan and organize activities. Instruct on pacing techniques, use of adaptive equipment and posture/positioning to relieve shortness of breath.   Triggers: - Group verbal and written instruction to review types of environmental controls: home humidity, furnaces, filters, dust mite/pet prevention, HEPA vacuums. To discuss weather changes, air quality and the benefits of nasal washing.   Exacerbations: - Group verbal and written instruction to provide: warning signs, infection symptoms, calling MD promptly, preventive modes, and value of vaccinations. Review: effective airway clearance, coughing and/or vibration techniques. Create an Sports administrator.   Oxygen: - Individual and group verbal and  written instruction on oxygen therapy. Includes supplement oxygen, available portable oxygen systems, continuous and intermittent flow rates, oxygen safety, concentrators, and Medicare reimbursement for oxygen.   Respiratory Medications: - Group verbal and written instruction to review medications for lung disease. Drug class, frequency, complications, importance of spacers, rinsing mouth after steroid MDI's, and proper cleaning methods for nebulizers. Flowsheet Row Pulmonary Rehab from 07/19/2016 in [facility] Cardiac and Pulmonary Rehab  Date  01/09/16  Educator  LB  Instruction Review Code  2- meets goals/outcomes      AED/CPR: - Group verbal and written instruction with the use of models to demonstrate the basic use of the AED with the  basic ABC's of resuscitation. Flowsheet Row Pulmonary Rehab from 07/19/2016 in [facility] Cardiac and Pulmonary Rehab  Date  04/05/16  Educator  CE  Instruction Review Code  2- meets goals/outcomes      Breathing Retraining: - Provides individuals verbal and written instruction on purpose, frequency, and proper technique of diaphragmatic breathing and pursed-lipped breathing. Applies individual practice skills. Flowsheet Row Pulmonary Rehab from 07/19/2016 in [facility] Cardiac and Pulmonary Rehab  Date  01/09/16  Educator  LB  Instruction Review Code  2- meets goals/outcomes      Anatomy and Physiology of the Lungs: - Group verbal and written instruction with the use of models to provide basic lung anatomy and physiology related to function, structure and complications of lung disease. Flowsheet Row Pulmonary Rehab from 07/19/2016 in [facility] Cardiac and Pulmonary Rehab  Date  07/12/16  Educator  [place]  Instruction Review Code  2- meets goals/outcomes      Heart Failure: - Group verbal and written instruction on the basics of heart failure: signs/symptoms, treatments, explanation of ejection fraction, enlarged heart and cardiomyopathy. Flowsheet Row Pulmonary Rehab from 07/19/2016 in [facility] Cardiac and Pulmonary Rehab  Date  07/19/16  Educator  [place]  Instruction Review Code  2- meets goals/outcomes      Sleep Apnea: - Individual verbal and written instruction to review Obstructive Sleep Apnea. Review of risk factors, methods for diagnosing and types of masks and machines for OSA.   Anxiety: - Provides group, verbal and written instruction on the correlation between heart/lung disease and anxiety, treatment options, and management of anxiety.   Relaxation: - Provides group, verbal and written instruction about the benefits of relaxation for patients with heart/lung disease. Also provides patients with examples of relaxation techniques. Flowsheet Row Pulmonary Rehab from 07/19/2016 in [facility] Cardiac and  Pulmonary Rehab  Date  04/17/16  Educator  Elissa Hefty  Instruction Review Code  2- Meets goals/outcomes      Knowledge Questionnaire Score:    Core Components/Risk Factors/Patient Goals at Admission:   Core Components/Risk Factors/Patient Goals Review:      Goals and Risk Factor Review    Row Name 03/27/16 1457 04/08/16 1254 04/12/16 1242 04/12/16 1515 04/19/16 1217     Core Components/Risk Factors/Patient Goals Review   Personal Goals Review Sedentary;Develop more efficient breathing techniques such as purse lipped breathing and diaphragmatic breathing and practicing self-pacing with activity.;Improve shortness of breath with ADL's;Increase knowledge of respiratory medications and ability to use respiratory devices properly. Sedentary;Increase Strength and Stamina;Improve shortness of breath with ADL's  -- Develop more efficient breathing techniques such as purse lipped breathing and diaphragmatic breathing and practicing self-pacing with activity. (P)  Increase knowledge of respiratory medications and ability to use respiratory devices properly.   Review Ms Jobst is improving her stamina. She has not started back to the bike  water exercising, but plans to return to that after [facility]. She paces herself at home with activites tto minimalize her shortness of breath and uses PLB with her exercise goals. We discussed her MDI's and was given an aerochamber for her Proventil. I will check on the Breo  to see if appropriate tto use a spacer with this inhaler. [facility] informed staff today that yesterday she reached a milestone.  She was able to make her bed without stopping whereas before [facility] she would have to stop at least two times.  She stated she was really happy yesterday about this improvement/change.  Also she usually dumps cloths on the bed after getting out fo dryer.  Now she is able to fold them up too.  Not able to put away yet., but stated she is happy she is able to fold colthes up  now.  She noticed that she is able to do more housework and able to breathe easier.  Abbagail states she is unable to walk at home due to balance issues.  She is scheduled to see a Neurologist in the near future about her balance.  Tylisha reports using 3 pound weights at home.  She states she enjoyes working out with weights and is feeling stronger.   Royce blood pressure is stable . c/o heart rate 100 at home just walking. Heart rate upon arrival is 110 most days. Kenyata says she drinks a lot of water every day so she doesn't feel she is dehydrated.  I suggested she talk to her MD since she has not had her thyroid level checked lately. Bailey reports she had a monitor on in Jan or Feb but she wasn't told by her MD that it showed anything. She drinks decaf coffee only in am but on albuteral. Julaine also chews Nicotine gum since even thought she quit smoking 3 years ago she reports "I wake up every morning craving a cigarette this long".  Reviewed PLB with Ms. Angelo.  She has good technique when performing PLB and knows when to use it.   --   Expected Outcomes  -- Continue with [facility] prescription in order to achieve her goals; improve her stamina; increase her activities; improve her energy/stamina; and decrease her SOB.   Continue using PLB with ADLS and Exercise.   Heart rate 80-90. Blood pressure cont to be stable.  This will decrease her WOB and SOB while exercising and performing ADLs.  --   [place] Name 04/19/16 1243 07/12/16 1455           Core Components/Risk Factors/Patient Goals Review   Personal Goals Review Increase knowledge of respiratory medications and ability to use respiratory devices properly. Hypertension;Sedentary;Increase Strength and Stamina      Review Reviewed medications with Ms. Stefanick.  She takes Albuterol, Breo and Duoneb at home.  She has a spacer for ther MDI's.  She is taking her medications as prescribed and does not have any new questions or concerns with her medications. Shantana has  been out for 3 months and states it is hard starting back.  Bps have been good in [facility] classes.      Expected Outcomes Taking her medications as prescribed will decrease her exacerabations and SOB. Alyzabeth will continue to improve strength and stamina with regular participation in [facility].         Core Components/Risk Factors/Patient Goals at Discharge (Final Review):      Goals and Risk Factor Review - 07/12/16 1455      Core  Components/Risk Factors/Patient Goals Review   Personal Goals Review Hypertension;Sedentary;Increase Strength and Stamina   Review Kiyona has been out for 3 months and states it is hard starting back.  Bps have been good in [facility] classes.   Expected Outcomes Amoreena will continue to improve strength and stamina with regular participation in [place].      ITP Comments:     ITP Comments    Row Name 04/12/16 1237 05/22/16 1348 06/03/16 0745 06/06/16 1418 06/21/16 1021   ITP Comments Mariapaula c/o heart rate 100 at home just walking. I suggested she talk to her MD since she has not had her thyroid level checked lately. Jettie reports she had a monitor on in Jan or Feb but she wasn't told by her MD that it showed anything. She drinks decaf coffee only in am but on albuteral. Suni also chews Nicotine gum since even thought she quit smoking 3 years ago she reports "I wake up every morning craving a cigarette this long".  Ms Giannotti last visit was 04/19/16 Ms Spinola called on 05/30/2016 to update her medical issues: she has just got off her 30 day heart monitor; she has an appointment with her cardiologist on 06/18/2016; and then she will know when she can return to [place]. Ms Olvey last visit was 04/19/16.  She has an appointment with cardiologist 8/1 and will get clearance befor ereturning to [place]. Ms Galbreath has not attended since 04/19/16   Row Name 07/04/16 1309           ITP Comments Ms Patton has not attended [facility] since 04/19/16.          Comments: 30 day note  review

## 2016-07-29 DIAGNOSIS — J449 Chronic obstructive pulmonary disease, unspecified: Secondary | ICD-10-CM | POA: Diagnosis not present

## 2016-07-29 NOTE — Progress Notes (Signed)
Daily Session Note  Patient Details  Name: Jessica Bowman MRN: [id number] Date of Birth: [birth year] Referring Provider:   Flowsheet Row Documentation from 04/29/2016 in [facility] Rehab  Referring Provider  Dr. Raul Del      Encounter Date: 07/29/2016  Check In:     Session Check In - 07/29/16 1313      Check-In   Location [facility]-Cardiac & Pulmonary Rehab   Staff Present Carson Myrtle, BS, RRT, Respiratory Therapist;Kelly Amedeo Plenty, BS, ACSM CEP, Exercise Physiologist;Amrom Ore Oletta Darter, BA, ACSM CEP, Exercise Physiologist   Supervising physician immediately available to respond to emergencies [facility] immediately available ER MD   Physician(s) Jenelle Mages   Medication changes reported     No   Fall or balance concerns reported    No   Warm-up and Cool-down Performed on first and last piece of equipment   Resistance Training Performed Yes   VAD Patient? No     VAD patient   Has back up controller? No     Pain Assessment   Currently in Pain? No/denies   Multiple Pain Sites No         Goals Met:  Proper associated with RPD/PD & O2 Sat Independence with exercise equipment Exercise tolerated well Strength training completed today  Goals Unmet:  Not Applicable  Comments: Pt able to follow exercise prescription today without complaint.  Will continue to monitor for progression.    Dr. Emily Filbert is Medical Director for [place] and [facility].

## 2016-07-31 ENCOUNTER — Encounter: Payer: Medicare Other | Admitting: *Deleted

## 2016-07-31 DIAGNOSIS — J449 Chronic obstructive pulmonary disease, unspecified: Secondary | ICD-10-CM

## 2016-07-31 NOTE — Progress Notes (Addendum)
Daily Session Note  Patient Details  Name: Jessica Bowman MRN: [id number] Date of Birth: May 22, 1941 Referring Provider:   Flowsheet Row Documentation from 04/29/2016 in [facility] Cardiac [facility] Pul[facility] Rehab  Referring Provider  Dr. Raul Del      Encounter Date: 07/31/2016  Check In:     Session Check In - 07/31/16 1136      Check-In   Location [facility]-Cardiac & Pulmonary Rehab   Staff Present Gerlene Burdock, RN, BSN;Laureen Owens Shark, BS, RRT, Respiratory Lennie Hummer, MA, ACSM RCEP, Exercise Physiologist   Supervising physician immediately available to respond to emergencies See telemetry face sheet for immediately available ER MD   Medication changes reported     No   Fall or balance concerns reported    No   Warm-up and Cool-down Performed on first and last piece of equipment   Resistance Training Performed Yes   VAD Patient? No     VAD patient   Has back up controller? No           Exercise Prescription Changes - 07/30/16 1500      Exercise Review   Progression Yes     Response to Exercise   Blood Pressure (Admit) 120/64   Blood Pressure (Exercise) 158/82   Blood Pressure (Exit) 100/60   Heart Rate (Admit) 109 bpm   Heart Rate (Exercise) 123 bpm   Heart Rate (Exit) 104 bpm   Oxygen Saturation (Admit) 96 %   Oxygen Saturation (Exercise) 91 %   Oxygen Saturation (Exit) 94 %   Rating of Perceived Exertion (Exercise) 13   Perceived Dyspnea (Exercise) 3   Intensity THRR unchanged     Progression   Progression Continue to progress workloads to maintain intensity without signs/symptoms of physical distress.     Resistance Training   Training Prescription Yes   Weight 3   Reps 10-12     Interval Training   Interval Training No     Treadmill   MPH 1.4   Grade 0   Minutes 15     NuStep   Level 3   Minutes 15     Biostep-RELP   Level 3   Minutes 15      Goals Met:  Proper associated with RPD/PD & O2 Sat Independence with exercise  equipment Using PLB without cueing & demonstrates good technique Exercise tolerated well Strength training completed today  Goals Unmet:  Not Applicable  Comments: Pt able to follow exercise prescription today without complaint.  Will continue to monitor for progression.  Reviewed home exercise with pt today.  Pt plans to walk at home for exercise.  Reviewed THR, pulse, RPE, sign and symptoms, and when to call 911 or MD.  Also discussed weather considerations and indoor options.  Pt voiced understanding. Alberteen Sam, [place], ACSM RCEP  [phone] 07/31/2016 2:23 PM     Dr. Emily Filbert is Medical Director for [place] and [facility].

## 2016-08-02 ENCOUNTER — Encounter: Payer: Medicare Other | Admitting: Respiratory Therapy

## 2016-08-02 DIAGNOSIS — J449 Chronic obstructive pulmonary disease, unspecified: Secondary | ICD-10-CM | POA: Diagnosis not present

## 2016-08-02 NOTE — Progress Notes (Signed)
Daily Session Note  Patient Details  Name: Jessica Bowman MRN: [id number] Date of Birth: [birth year] Referring Provider:   Flowsheet Row Documentation from 04/29/2016 in [facility] Cardiac and Pulmonary Rehab  Referring Provider  Dr. Raul Del      Encounter Date: 08/02/2016  Check In:     Session Check In - 08/02/16 1153      Check-In   Location [facility]-Cardiac & Pulmonary Rehab   Staff Present Heath Lark, RN, BSN, CCRP;Kechia Yahnke Blanch Media, RRT, RCP, Respiratory Therapist;Carroll Enterkin, RN, BSN   Supervising physician immediately available to respond to emergencies [facility] immediately available ER MD   Physician(s) Dr. Quentin Cornwall & McShane   Medication changes reported     No   Fall or balance concerns reported    No   Warm-up and Cool-down Performed on first and last piece of equipment   Resistance Training Performed Yes   VAD Patient? No     Pain Assessment   Currently in Pain? No/denies         Goals Met:  Proper associated with RPD/PD & O2 Sat Independence with exercise equipment Using PLB without cueing & demonstrates good technique Exercise tolerated well Strength training completed today  Goals Unmet:  Not Applicable  Comments: Pt able to follow exercise prescription today without complaint.  Will continue to monitor for progression.   Dr. Emily Filbert is Medical Director for [place] and [facility].

## 2016-08-07 DIAGNOSIS — J449 Chronic obstructive pulmonary disease, unspecified: Secondary | ICD-10-CM | POA: Diagnosis not present

## 2016-08-07 NOTE — Progress Notes (Signed)
Daily Session Note  Patient Details  Name: Jessica Bowman MRN: [id number] Date of Birth: 1941-09-05 Referring Provider:   Arn Medal Row Documentation from 04/29/2016 in [facility] Cardiac and Pulmonary Rehab  Referring Provider  Dr. Raul Del      Encounter Date: 08/07/2016  Check In:     Session Check In - 08/07/16 1231      Check-In   Location [facility]-Cardiac & Pulmonary Rehab   Staff Present Alberteen Sam, MA, ACSM RCEP, Exercise Physiologist;Carroll Enterkin, RN, BSN;Eknoor Kellie Shropshire, RN, BSN, MA         Goals Met:  Proper associated with RPD/PD & O2 Sat Independence with exercise equipment Exercise tolerated well Strength training completed today  Goals Unmet:  Not Applicable  Comments: Pt able to follow exercise prescription today without complaint.  Will continue to monitor for progression.    Dr. Emily Filbert is Medical Director for [place] and [facility].

## 2016-08-09 ENCOUNTER — Encounter: Payer: Medicare Other | Admitting: Respiratory Therapy

## 2016-08-09 DIAGNOSIS — J449 Chronic obstructive pulmonary disease, unspecified: Secondary | ICD-10-CM

## 2016-08-09 NOTE — Progress Notes (Signed)
Daily Session Note  Patient Details  Name: BRYLIE SNEATH MRN: [id number] Date of Birth: 1941/07/26 Referring Provider:   Flowsheet Row Documentation from 04/29/2016 in [facility] Cardiac and Pulmonary Rehab  Referring Provider  Dr. Raul Del      Encounter Date: 08/09/2016  Check In:     Session Check In - 08/09/16 1412      Check-In   Location [facility]-Cardiac & Pulmonary Rehab   Staff Present Alberteen Sam, MA, ACSM RCEP, Exercise Physiologist;Kairos Panetta Blanch Media, RRT, RCP, Respiratory Therapist;Carroll Enterkin, RN, BSN   Supervising physician immediately available to respond to emergencies [facility] immediately available ER MD   Physician(s) Dr. Jimmye Norman and Darl Householder   Medication changes reported     No   Fall or balance concerns reported    No   Warm-up and Cool-down Performed on first and last piece of equipment   Resistance Training Performed Yes   VAD Patient? No     Pain Assessment   Currently in Pain? No/denies         Goals Met:  Proper associated with RPD/PD & O2 Sat Independence with exercise equipment Using PLB without cueing & demonstrates good technique Exercise tolerated well Strength training completed today  Goals Unmet:  Not Applicable  Comments: Pt able to follow exercise prescription today without complaint.  Will continue to monitor for progression.    Dr. Emily Filbert is Medical Director for [place] and [facility].

## 2016-08-14 ENCOUNTER — Encounter: Payer: Self-pay | Admitting: Respiratory Therapy

## 2016-08-14 ENCOUNTER — Encounter: Payer: Medicare Other | Admitting: *Deleted

## 2016-08-14 DIAGNOSIS — J449 Chronic obstructive pulmonary disease, unspecified: Secondary | ICD-10-CM

## 2016-08-14 NOTE — Progress Notes (Signed)
Daily Session Note  Patient Details  Name: Jessica Bowman MRN: [id number] Date of Birth: 03/14/1941 Referring Provider:   Flowsheet Row Documentation from 04/29/2016 in [facility] Cardiac and Pulmonary Rehab  Referring Provider  Dr. Raul Del      Encounter Date: 08/14/2016  Check In:     Session Check In - 08/14/16 1319      Check-In   Staff Present Heath Lark, RN, BSN, CCRP;Carroll Enterkin, RN, BSN;Laureen Owens Shark, BS, RRT, Respiratory Therapist   Supervising physician immediately available to respond to emergencies [facility] immediately available ER MD   Physician(s) Drs: Alfred Levins and Joni Fears   Medication changes reported     No   Fall or balance concerns reported    No   Warm-up and Cool-down Performed as group-led instruction   Resistance Training Performed Yes   VAD Patient? No     Pain Assessment   Currently in Pain? No/denies         Goals Met:  Using PLB without cueing & demonstrates good technique Exercise tolerated well Strength training completed today  Goals Unmet:  Not Applicable  Comments: Patient psychosocial assessment reveals no barriers to participation in Pulmonary Rehab. Psychosocial areas that are currently affecting patient's rehab experience include concerns about treatment decisions, and emotional problems as evidenced by fear of another syncopal episode occurring. Patient does continue to exhibit positive coping skills to deal with her psychosocial concerns. Offered emotional support and reassurance. Patient does feel she is making progress toward Pulmonary Rehab goals. Patient reports her health and activity level has improved in the past 30 days as evidenced by patient's report of increasedability to complete her exercise prescription and improved ability to manage daily activities.  Patient reports  feeling positive about current and projected progression in Pulmonary Rehab. After reviewing the patient's treatment plan, the patient is making progress  toward Pulmonary Rehab goals. Patient's rate of progress toward rehab goals is excellent. Plan of action to help patient continue to work towards rehab goals include decreasing fear/anxiety of another syncopal episode. Advised Jessica Bowman to talk to her primary MD about her "shoulders rising up to her ears" because of her anxiety. Will also have psychosocial staff talk to [facility] for any resource/techniques to help reduce anxiety. Will continue to monitor and evaluate progress toward psychosocial goal(s).  Goal(s) in progress: Improved management of anxiety Improved coping skills Help patient work toward returning to meaningful activities that improve patient's QOL and are attainable with patient's lung disease       Dr. Emily Filbert is Medical Director for [place] and [facility].

## 2016-08-14 NOTE — Progress Notes (Signed)
Pulmonary Individual Treatment Plan  Patient Details  Name: Jessica Bowman MRN: [id number] Date of Birth: 1941/03/13 Referring Provider:   Arn Medal Row Documentation from 04/29/2016 in [facility] Cardiac and Pulmonary Rehab  Referring Provider  Dr. Raul Del      Initial Encounter Date:  Flowsheet Row Documentation from 04/29/2016 in [facility] Cardiac and Pulmonary Rehab  Date  01/09/16  Referring Provider  Dr. Raul Del      Visit Diagnosis: COPD, moderate ([place])  Patient's Home Medications on Admission:  Current Outpatient Prescriptions:    albuterol (PROAIR HFA) 108 (90 Base) MCG/ACT inhaler, Inhale 2 puffs into the lungs See admin instructions. Inhale 2 puffs every 4 to 6 hours as needed for shortness of breath/wheezing., Disp: , Rfl:    aspirin EC 81 MG tablet, Take 81 mg by mouth at bedtime. , Disp: , Rfl:    Cholecalciferol (VITAMIN D3) 1000 units CAPS, Take 1,000 Units by mouth daily. , Disp: , Rfl:    cyclobenzaprine (FLEXERIL) 5 MG tablet, Take 5 mg by mouth daily as needed for muscle spasms. , Disp: , Rfl:    escitalopram (LEXAPRO) 10 MG tablet, Take 5-10 mg by mouth See admin instructions. Take 1/2 tablet  (5 mg) by mouth every morning, and take 1 tablet by mouth every night at bedtime., Disp: , Rfl:    fluticasone furoate-vilanterol (BREO ELLIPTA) 100-25 MCG/INH AEPB, Inhale 1 puff into the lungs daily. , Disp: , Rfl:    ipratropium-albuterol (DUONEB) 0.5-2.5 (3) MG/3ML SOLN, Take 3 mLs by nebulization 2 (two) times daily as needed. For shortness of breath/wheezing., Disp: , Rfl:    irbesartan-hydrochlorothiazide (AVALIDE) 150-12.5 MG tablet, Take 0.5 tablets by mouth daily. , Disp: , Rfl:    LUMIGAN 0.01 % SOLN, Place 1 drop into both eyes at bedtime., Disp: , Rfl:    Melatonin 5 MG TABS, Take 5 mg by mouth at bedtime as needed. For sleep., Disp: , Rfl:    Multiple Vitamins-Minerals (STRESSTABS ADVANCED) TABS, Take 1 tablet by mouth daily., Disp: , Rfl:    Probiotic Product  ([place]), Take 1 capsule by mouth daily. , Disp: , Rfl:   Past Medical History: Past Medical History:  Diagnosis Date   COPD (chronic obstructive pulmonary disease) ([place])    Glaucoma    Hypertension    Osteoporosis     Tobacco Use: History  Smoking Status   Former Smoker   Quit date: 04/20/2012  Smokeless Tobacco   Not on file    Labs: Recent Review Flowsheet Data    There is no flowsheet data to display.       ADL UCSD:   Pulmonary Function Assessment:   Exercise Target Goals:    Exercise Program Goal: Individual exercise prescription set with THRR, safety & activity barriers. Participant demonstrates ability to understand and report RPE using BORG scale, to self-measure pulse accurately, and to acknowledge the importance of the exercise prescription.  Exercise Prescription Goal: Starting with aerobic activity 30 plus minutes a day, 3 days per week for initial exercise prescription. Provide home exercise prescription and guidelines that participant acknowledges understanding prior to discharge.  Activity Barriers & Risk Stratification:   6 Minute Walk:   Initial Exercise Prescription:     Initial Exercise Prescription - 04/29/16 1400      Date of Initial Exercise RX and Referring Provider   Date 01/09/16   Referring Provider Dr. Raul Del      Perform Capillary Blood Glucose checks as needed.  Exercise Prescription  Changes:     Exercise Prescription Changes    Row Name 03/15/16 1300 03/29/16 1200 04/05/16 1200 04/19/16 1130 07/17/16 1300     Exercise Review   Progression  --  --  --  -- Yes     Response to Exercise   Blood Pressure (Admit)  --  --  -- 142/70  --   Blood Pressure (Exercise)  --  --  -- 122/60 118/60   Blood Pressure (Exit)  --  --  -- 120/60 120/70   Heart Rate (Admit)  --  --  -- 115 bpm 83 bpm   Heart Rate (Exercise)  --  --  -- 118 bpm 124 bpm   Heart Rate (Exit)  --  --  -- 97 bpm 91 bpm   Oxygen  Saturation (Admit)  --  --  -- 97 % 94 %   Oxygen Saturation (Exercise)  --  --  -- 95 % 94 %   Oxygen Saturation (Exit)  --  --  -- 97 % 95 %   Rating of Perceived Exertion (Exercise)  --  --  -- 13 14   Perceived Dyspnea (Exercise)  --  --  -- 4 4   Symptoms  --  --  -- mild headache  --   Intensity  --  --  -- THRR New  119-137 THRR unchanged     Progression   Progression Continue progressive overload as per policy without signs/symptoms or physical distress. Continue progressive overload as per policy without signs/symptoms or physical distress. Continue progressive overload as per policy without signs/symptoms or physical distress. Continue to progress workloads to maintain intensity without signs/symptoms of physical distress. Continue to progress workloads to maintain intensity without signs/symptoms of physical distress.     Resistance Training   Training Prescription  --  -- Yes Yes Yes   Weight  --  -- '3 3 3   '$ Reps  --  -- 10-12 10-12 10-12     Interval Training   Interval Training  --  --  --  -- No     Treadmill   MPH 1.2 1.2 1.3  -- 1.3   Grade 0 0 0  -- 0   Minutes '12 12 15  '$ -- 15     NuStep   Level  -- 2 2  -- 3   Watts  -- 40 25  --  --   Minutes  -- 15 15  -- 15     Biostep-RELP   Level '2 2 2  '$ -- 3   Watts '20 20 20  '$ --  --   Minutes '10 15 15  '$ -- 15   METs  --  --  --  -- 2   Row Name 07/30/16 1500 07/31/16 1400           Exercise Review   Progression Yes Yes        Response to Exercise   Blood Pressure (Admit) 120/64  --      Blood Pressure (Exercise) 158/82  --      Blood Pressure (Exit) 100/60  --      Heart Rate (Admit) 109 bpm  --      Heart Rate (Exercise) 123 bpm  --      Heart Rate (Exit) 104 bpm  --      Oxygen Saturation (Admit) 96 %  --      Oxygen Saturation (Exercise) 91 %  --      Oxygen  Saturation (Exit) 94 %  --      Rating of Perceived Exertion (Exercise) 13  --      Perceived Dyspnea (Exercise) 3  --      Symptoms  -- none       Comments  -- Home Exercise Guidelines given 07/31/16      Intensity THRR unchanged THRR unchanged        Progression   Progression Continue to progress workloads to maintain intensity without signs/symptoms of physical distress. Continue to progress workloads to maintain intensity without signs/symptoms of physical distress.        Resistance Training   Training Prescription Yes Yes      Weight 3 3      Reps 10-12 10-12        Interval Training   Interval Training No No        Treadmill   MPH 1.4 1.4      Grade 0 0      Minutes 15 15        NuStep   Level 3 3      Minutes 15 15        Biostep-RELP   Level 3 3      Minutes 15 15        Home Exercise Plan   Plans to continue exercise at  -- Home  walking      Frequency  -- Add 2 additional days to program exercise sessions.         Exercise Comments:     Exercise Comments    Row Name 04/24/16 1212 05/22/16 1348 06/06/16 1417 06/21/16 1020 07/04/16 1309   Exercise Comments Ms Kope has been on a heart monitor and may have a cath per phone call to Laureen - she will return when cleared to exercise and exercise will be modified if needed. Ms Streng last visit was 04/19/16 Ms Slaubaugh last visit was 04/19/16.  She has an appointment with cardiologist 8/1 and will get clearance befor ereturning to [place]. Ms Collison has not attended since 04/19/16 Ms Hochman has not attended [facility] since 04/19/16.   [place] Name 07/10/16 1444 07/17/16 1342 07/30/16 1552 07/31/16 1424     Exercise Comments Cacie returned today without any problems. Kamilla has done well her first 2 [facility]. Donnisha is progressing well with exercise. Reviewed home exercise with pt today.  Pt plans to walk at home for exercise.  Reviewed THR, pulse, RPE, sign and symptoms, and when to call 911 or MD.  Also discussed weather considerations and indoor options.  Pt voiced understanding.       Discharge Exercise Prescription (Final Exercise Prescription Changes):      Exercise Prescription Changes - 07/31/16 1400      Exercise Review   Progression Yes     Response to Exercise   Symptoms none   Comments Home Exercise Guidelines given 07/31/16   Intensity THRR unchanged     Progression   Progression Continue to progress workloads to maintain intensity without signs/symptoms of physical distress.     Resistance Training   Training Prescription Yes   Weight 3   Reps 10-12     Interval Training   Interval Training No     Treadmill   MPH 1.4   Grade 0   Minutes 15     NuStep   Level 3   Minutes 15     Biostep-RELP   Level 3   Minutes 15  Home Exercise Plan   Plans to continue exercise at Home  walking   Frequency Add 2 additional days to program exercise sessions.       Nutrition:  Target Goals: Understanding of nutrition guidelines, daily intake of sodium '1500mg'$ , cholesterol '200mg'$ , calories 30% from fat and 7% or less from saturated fats, daily to have 5 or more servings of fruits and vegetables.  Biometrics:    Nutrition Therapy Plan and Nutrition Goals:   Nutrition Discharge: Rate Your Plate Scores:   Psychosocial: Target Goals: Acknowledge presence or absence of depression, maximize coping skills, provide positive support system. Participant is able to verbalize types and ability to use techniques and skills needed for reducing stress and depression.  Initial Review & Psychosocial Screening:   Quality of Life Scores:   PHQ-9: Recent Review Flowsheet Data    Depression screen [facility] 2/9 01/09/2016   Decreased Interest 2   Down, Depressed, Hopeless 1   PHQ - 2 Score 3   Altered sleeping 2   Tired, decreased energy 2   Change in appetite 1   Feeling bad or failure about yourself  0   Trouble concentrating 0   Moving slowly or fidgety/restless 0   Suicidal thoughts 0   PHQ-9 Score 8   Difficult doing work/chores Not difficult at all      Psychosocial Evaluation and Intervention:     Psychosocial  Evaluation - 04/08/16 1249      Psychosocial Evaluation & Interventions   Interventions Encouraged to exercise with the program and follow exercise prescription;Relaxation education;Stress management education   Comments Counselor met with Ms. Piasecki today for initial psychosocial evalution.  She is a 75 yr old who was diagnosed with COPD in 2014.  Ms. Arvin lives alone since the death of her spouse this past 11-22-23 from lung cancer.  She became very sedentary during that time and her health issues increased as a result.  She has multiple health issues including vision & digestive  problems as well as anxiety .  She reports taking medications for all of these - including the anxiety and that they are currently being effective.  Ms. Kath states she has trouble getting to sleep, but sleeps at least 7-8 hours per night.  She states this program has already improved her sleep slightly and increased her ability to do normal activities.  Ms. Endres is typically in a positive mood and continues to have anxiety in new situations primarily.  She has an appointment with a neurologist in the next few weeks to assess why she fainted twice over the past several months.  Ms. Claudio has goals to be more fit and more active.  She plans to look into follow up programs to maintain her progress upon completion of this program.    Continued Psychosocial Services Needed Yes  Ms. Stammer will benefit from the psychoeducational components of this program, especially anxiety and relaxation.        Psychosocial Re-Evaluation:     Psychosocial Re-Evaluation    Row Name 06/24/16 [phone] 07/17/16 1631 08/14/16 1325 08/14/16 1330       Psychosocial Re-Evaluation   Interventions  -- Encouraged to attend Pulmonary Rehabilitation for the exercise;Relaxation education;Stress management education Encouraged to attend Pulmonary Rehabilitation for the exercise   encouraged Rakhi to talk to her PMD about her anxiety levels.  --     Comments Ms Sledd has had medical issues and has not attended since 04/19/2016. IN talking with her several times, she states  she is looking forward to returning to [facility] for it has been so encouraging for her both mentally and physically. Khai's psychosocial assessment reveals no barriers to participation in Pulmonary Rehab. Psychosocial areas that are currently affecting patient's rehab experience include concerns about treatment decisions, dealing with as evidenced by fear and worry about another syncopal episode occurring without warning.Corrie Dandy does continue to exhibit positive coping skills to deal with her psychosocial concerns. Offered emotional support and reassurance. Almeda does feel she is making progress toward Pulmonary Rehab goals, even though she has been out for several months because of syncopal episodes and evaluation. Melena reports her health and activity level HAS NOT improved in the past 30 days, since she has not been in the program for that time period.Marland Kitchenas evidenced by patient's report of unchanged ability to exercise. Patient reports feeling positive about current and projected progression in Pulmonary Rehab. After reviewing the patient's treatment plan, the patient is making progress toward Pulmonary Rehab goals. Patient's rate of progress toward rehab goals is good. Plan of action to help patient continue to work towards rehab goals include time with psychosocial counselor to review the fear of another episode and to look at coping mechanisms to help decrease fears. . Will continue to monitor and evaluate progress toward psychosocial goal(s). Patient psychosocial assessment reveals no barriers to participation in Pulmonary Rehab. Psychosocial areas that are currently affecting patient's rehab experience include concerns about treatment decisions, and emotional problems as evidenced by fear of another syncopal episode occurring. Patient does continue to exhibit positive coping skills to  deal with her psychosocial concerns. Offered emotional support and reassurance. Patient does feel she is making progress toward Pulmonary Rehab goals. Patient reports her health and activity level has improved in the past 30 days as evidenced by patient's report of increasedability to complete her exercise prescription and improved ability to manage daily activities.  Patient reports  feeling positive about current and projected progression in Pulmonary Rehab. After reviewing the patient's treatment plan, the patient is making progress toward Pulmonary Rehab goals. Patient's rate of progress toward rehab goals is excellent. Plan of action to help patient continue to work towards rehab goals include decreasing fear/anxiety of another syncopal episode. Advised Izzabell to talk to her primary MD about her "shoulders rising up to her ears" because of her anxiety. Will also have psychosocial staff talk to [facility] for any resource/techniques to help reduce anxiety. Will continue to monitor and evaluate progress toward psychosocial goal(s). Ayen did for first time since syncopal episodes take her grandchildren with her in her car.     Continued Psychosocial Services Needed  -- Yes Yes  --      Education: Education Goals: Education classes will be provided on a weekly basis, covering required topics. Participant will state understanding/return demonstration of topics presented.  Learning Barriers/Preferences:   Education Topics: Initial Evaluation Education: - Verbal, written and demonstration of respiratory meds, RPE/PD scales, oximetry and breathing techniques. Instruction on use of nebulizers and MDIs: cleaning and proper use, rinsing mouth with steroid doses and importance of monitoring MDI activations. Flowsheet Row Pulmonary Rehab from 08/07/2016 in [facility] Cardiac and Pulmonary Rehab  Date  01/09/16  Educator  LB  Instruction Review Code  2- meets goals/outcomes      General Nutrition Guidelines/Fats and  Fiber: -Group instruction provided by verbal, written material, models and posters to present the general guidelines for heart healthy nutrition. Gives an explanation and review of dietary fats and fiber.   Controlling  Sodium/Reading Food Labels: -Group verbal and written material supporting the discussion of sodium use in heart healthy nutrition. Review and explanation with models, verbal and written materials for utilization of the food label.   Exercise Physiology & Risk Factors: - Group verbal and written instruction with models to review the exercise physiology of the cardiovascular system and associated critical values. Details cardiovascular disease risk factors and the goals associated with each risk factor. Flowsheet Row Pulmonary Rehab from 08/07/2016 in [facility] Cardiac and Pulmonary Rehab  Date  07/10/16  Educator  AS  Instruction Review Code  2- meets goals/outcomes      Aerobic Exercise & Resistance Training: - Gives group verbal and written discussion on the health impact of inactivity. On the components of aerobic and resistive training programs and the benefits of this training and how to safely progress through these programs. Flowsheet Row Pulmonary Rehab from 08/07/2016 in [facility] Cardiac and Pulmonary Rehab  Date  08/07/16  Educator  JH/AS  Instruction Review Code  2- meets goals/outcomes      Flexibility, Balance, General Exercise Guidelines: - Provides group verbal and written instruction on the benefits of flexibility and balance training programs. Provides general exercise guidelines with specific guidelines to those with heart or lung disease. Demonstration and skill practice provided. Flowsheet Row Pulmonary Rehab from 08/07/2016 in [facility] Cardiac and Pulmonary Rehab  Date  03/27/16  Educator  Salley Hews, PT`  Instruction Review Code  2- meets goals/outcomes      Stress Management: - Provides group verbal and written instruction about the health risks of elevated  stress, cause of high stress, and healthy ways to reduce stress.   Depression: - Provides group verbal and written instruction on the correlation between heart/lung disease and depressed mood, treatment options, and the stigmas associated with seeking treatment.   Exercise & Equipment Safety: - Individual verbal instruction and demonstration of equipment use and safety with use of the equipment.   Infection Prevention: - Provides verbal and written material to individual with discussion of infection control including proper hand washing and proper equipment cleaning during exercise session.   Falls Prevention: - Provides verbal and written material to individual with discussion of falls prevention and safety. Flowsheet Row Pulmonary Rehab from 08/07/2016 in [facility] Cardiac and Pulmonary Rehab  Date  01/09/16  Educator  LB  Instruction Review Code  2- meets goals/outcomes      Diabetes: - Individual verbal and written instruction to review signs/symptoms of diabetes, desired ranges of glucose level fasting, after meals and with exercise. Advice that pre and post exercise glucose checks will be done for 3 sessions at entry of program.   Chronic Lung Diseases: - Group verbal and written instruction to review new updates, new respiratory medications, new advancements in procedures and treatments. Provide informative websites and "800" numbers of self-education.   Lung Procedures: - Group verbal and written instruction to describe testing methods done to diagnose lung disease. Review the outcome of test results. Describe the treatment choices: Pulmonary Function Tests, ABGs and oximetry. Flowsheet Row Pulmonary Rehab from 08/07/2016 in [facility] Cardiac and Pulmonary Rehab  Date  04/12/16  Educator  [place]  Instruction Review Code  2- meets goals/outcomes      Energy Conservation: - Provide group verbal and written instruction for methods to conserve energy, plan and organize activities.  Instruct on pacing techniques, use of adaptive equipment and posture/positioning to relieve shortness of breath.   Triggers: - Group verbal and written instruction to review types  of environmental controls: home humidity, furnaces, filters, dust mite/pet prevention, HEPA vacuums. To discuss weather changes, air quality and the benefits of nasal washing. Flowsheet Row Pulmonary Rehab from 08/07/2016 in [facility] Cardiac and Pulmonary Rehab  Date  07/29/16  Educator  LB  Instruction Review Code  2- meets goals/outcomes      Exacerbations: - Group verbal and written instruction to provide: warning signs, infection symptoms, calling MD promptly, preventive modes, and value of vaccinations. Review: effective airway clearance, coughing and/or vibration techniques. Create an Sports administrator.   Oxygen: - Individual and group verbal and written instruction on oxygen therapy. Includes supplement oxygen, available portable oxygen systems, continuous and intermittent flow rates, oxygen safety, concentrators, and Medicare reimbursement for oxygen.   Respiratory Medications: - Group verbal and written instruction to review medications for lung disease. Drug class, frequency, complications, importance of spacers, rinsing mouth after steroid MDI's, and proper cleaning methods for nebulizers. Flowsheet Row Pulmonary Rehab from 08/07/2016 in [facility] Cardiac and Pulmonary Rehab  Date  01/09/16  Educator  LB  Instruction Review Code  2- meets goals/outcomes      AED/CPR: - Group verbal and written instruction with the use of models to demonstrate the basic use of the AED with the basic ABC's of resuscitation. Flowsheet Row Pulmonary Rehab from 08/07/2016 in [facility] Cardiac and Pulmonary Rehab  Date  04/05/16  Educator  CE  Instruction Review Code  2- meets goals/outcomes      Breathing Retraining: - Provides individuals verbal and written instruction on purpose, frequency, and proper technique of diaphragmatic  breathing and pursed-lipped breathing. Applies individual practice skills. Flowsheet Row Pulmonary Rehab from 08/07/2016 in [facility] Cardiac and Pulmonary Rehab  Date  01/09/16  Educator  LB  Instruction Review Code  2- meets goals/outcomes      Anatomy and Physiology of the Lungs: - Group verbal and written instruction with the use of models to provide basic lung anatomy and physiology related to function, structure and complications of lung disease. Flowsheet Row Pulmonary Rehab from 08/07/2016 in [facility] Cardiac and Pulmonary Rehab  Date  07/12/16  Educator  [place]  Instruction Review Code  2- meets goals/outcomes      Heart Failure: - Group verbal and written instruction on the basics of heart failure: signs/symptoms, treatments, explanation of ejection fraction, enlarged heart and cardiomyopathy. Flowsheet Row Pulmonary Rehab from 08/07/2016 in [facility] Cardiac and Pulmonary Rehab  Date  07/19/16  Educator  [place]  Instruction Review Code  2- meets goals/outcomes      Sleep Apnea: - Individual verbal and written instruction to review Obstructive Sleep Apnea. Review of risk factors, methods for diagnosing and types of masks and machines for OSA.   Anxiety: - Provides group, verbal and written instruction on the correlation between heart/lung disease and anxiety, treatment options, and management of anxiety.   Relaxation: - Provides group, verbal and written instruction about the benefits of relaxation for patients with heart/lung disease. Also provides patients with examples of relaxation techniques. Flowsheet Row Pulmonary Rehab from 08/07/2016 in [facility] Cardiac and Pulmonary Rehab  Date  04/17/16  Educator  Elissa Hefty  Instruction Review Code  2- Meets goals/outcomes      Knowledge Questionnaire Score:    Core Components/Risk Factors/Patient Goals at Admission:   Core Components/Risk Factors/Patient Goals Review:      Goals and Risk Factor Review    Row Name 03/27/16 1457 04/08/16  1254 04/12/16 1242 04/12/16 1515 04/19/16 1217     Core Components/Risk Factors/Patient Goals  Review   Personal Goals Review Sedentary;Develop more efficient breathing techniques such as purse lipped breathing and diaphragmatic breathing and practicing self-pacing with activity.;Improve shortness of breath with ADL's;Increase knowledge of respiratory medications and ability to use respiratory devices properly. Sedentary;Increase Strength and Stamina;Improve shortness of breath with ADL's  -- Develop more efficient breathing techniques such as purse lipped breathing and diaphragmatic breathing and practicing self-pacing with activity. (P)  Increase knowledge of respiratory medications and ability to use respiratory devices properly.   Review Ms Zaugg is improving her stamina. She has not started back to the bike water exercising, but plans to return to that after [facility]. She paces herself at home with activites tto minimalize her shortness of breath and uses PLB with her exercise goals. We discussed her MDI's and was given an aerochamber for her Proventil. I will check on the Breo  to see if appropriate tto use a spacer with this inhaler. [facility] informed staff today that yesterday she reached a milestone.  She was able to make her bed without stopping whereas before [facility] she would have to stop at least two times.  She stated she was really happy yesterday about this improvement/change.  Also she usually dumps cloths on the bed after getting out fo dryer.  Now she is able to fold them up too.  Not able to put away yet., but stated she is happy she is able to fold colthes up now.  She noticed that she is able to do more housework and able to breathe easier.  Sumner states she is unable to walk at home due to balance issues.  She is scheduled to see a Neurologist in the near future about her balance.  Milta reports using 3 pound weights at home.  She states she enjoyes working out with weights and is feeling  stronger.   Veena blood pressure is stable . c/o heart rate 100 at home just walking. Heart rate upon arrival is 110 most days. Mahalie says she drinks a lot of water every day so she doesn't feel she is dehydrated.  I suggested she talk to her MD since she has not had her thyroid level checked lately. Dawnya reports she had a monitor on in Jan or Feb but she wasn't told by her MD that it showed anything. She drinks decaf coffee only in am but on albuteral. Verdie also chews Nicotine gum since even thought she quit smoking 3 years ago she reports "I wake up every morning craving a cigarette this long".  Reviewed PLB with Ms. Rostad.  She has good technique when performing PLB and knows when to use it.   --   Expected Outcomes  -- Continue with [facility] prescription in order to achieve her goals; improve her stamina; increase her activities; improve her energy/stamina; and decrease her SOB.   Continue using PLB with ADLS and Exercise.   Heart rate 80-90. Blood pressure cont to be stable.  This will decrease her WOB and SOB while exercising and performing ADLs.  --   [place] Name 04/19/16 1243 07/12/16 1455 08/02/16 1254 08/07/16 1305 08/14/16 1327     Core Components/Risk Factors/Patient Goals Review   Personal Goals Review Increase knowledge of respiratory medications and ability to use respiratory devices properly. Hypertension;Sedentary;Increase Strength and Stamina Sedentary;Increase Strength and Stamina Hypertension Increase knowledge of respiratory medications and ability to use respiratory devices properly.;Improve shortness of breath with ADL's;Develop more efficient breathing techniques such as purse lipped breathing and diaphragmatic breathing and practicing self-pacing  with activity.   Review Reviewed medications with Ms. Veazie.  She takes Albuterol, Breo and Duoneb at home.  She has a spacer for ther MDI's.  She is taking her medications as prescribed and does not have any new questions or concerns with her  medications. Briahnna has been out for 3 months and states it is hard starting back.  Bps have been good in [facility] classes. Deshante has not started walking at home. She is looking for her walker to be able to take with her for balance.  She is doing handweights and is planning to start a gait and balance program after graduating. Kennadie's blood pressure was elevated slightly today at 130/70 but normal for her is 112/70. Dalayna is doing well with her medication compliance and MDI use. She demonstrates great PLB technique while exercising.  She has stated that she has had to use her neulizer more often this week. She wil talk to her Pulmonary MD tomorrow about her nebulizer use.   Expected Outcomes Taking her medications as prescribed will decrease her exacerabations and SOB. Martine will continue to improve strength and stamina with regular participation in [place]. Laketha will continue to come to exercise to improve stamina and start walking at home. Stable blood pressure. Continues compliance with her medications. Changes as physican orders for use of nebulizer, Continues use of PLB with her activities.      Core Components/Risk Factors/Patient Goals at Discharge (Final Review):      Goals and Risk Factor Review - 08/14/16 1327      Core Components/Risk Factors/Patient Goals Review   Personal Goals Review Increase knowledge of respiratory medications and ability to use respiratory devices properly.;Improve shortness of breath with ADL's;Develop more efficient breathing techniques such as purse lipped breathing and diaphragmatic breathing and practicing self-pacing with activity.   Review Rafaelita is doing well with her medication compliance and MDI use. She demonstrates great PLB technique while exercising.  She has stated that she has had to use her neulizer more often this week. She wil talk to her Pulmonary MD tomorrow about her nebulizer use.   Expected Outcomes Continues compliance with her medications. Changes  as physican orders for use of nebulizer, Continues use of PLB with her activities.      ITP Comments:     ITP Comments    Row Name 04/12/16 1237 05/22/16 1348 06/03/16 0745 06/06/16 1418 06/21/16 1021   ITP Comments Yuliana c/o heart rate 100 at home just walking. I suggested she talk to her MD since she has not had her thyroid level checked lately. Zoie reports she had a monitor on in Jan or Feb but she wasn't told by her MD that it showed anything. She drinks decaf coffee only in am but on albuteral. Kyilee also chews Nicotine gum since even thought she quit smoking 3 years ago she reports "I wake up every morning craving a cigarette this long".  Ms Baik last visit was 04/19/16 Ms Wisher called on 05/30/2016 to update her medical issues: she has just got off her 30 day heart monitor; she has an appointment with her cardiologist on 06/18/2016; and then she will know when she can return to [place]. Ms Lecount last visit was 04/19/16.  She has an appointment with cardiologist 8/1 and will get clearance befor ereturning to [place]. Ms Wolken has not attended since 04/19/16   Row Name 07/04/16 1309           ITP Comments Ms Brun has not attended  [facility] since 04/19/16.          Comments: Face to Face meeting with Dr Sabra Heck today.

## 2016-08-16 ENCOUNTER — Encounter: Payer: Medicare Other | Admitting: Respiratory Therapy

## 2016-08-16 DIAGNOSIS — J449 Chronic obstructive pulmonary disease, unspecified: Secondary | ICD-10-CM

## 2016-08-16 NOTE — Progress Notes (Signed)
Daily Session Note  Patient Details  Name: Jessica Bowman MRN: [id number] Date of Birth: 03-23-1941 Referring Provider:   Flowsheet Row Documentation from 04/29/2016 in [facility]  Referring Provider  Dr. Raul Del      Encounter Date: 08/16/2016  Check In:     Session Check In - 08/16/16 1237      Check-In   Location [facility]   Staff Present Nyoka Cowden, RN, BSN, MA;Susanne Bice, RN, BSN, CCRP;Deandre Brannan Blanch Media, RRT, RCP, Respiratory Therapist   Supervising physician immediately available to respond to emergencies [facility] immediately available ER MD   Physician(s) Dr. Edd Fabian & Marcelene Butte   Medication changes reported     No   Fall or balance concerns reported    No   Warm-up and Cool-down Performed on first and last piece of equipment   Resistance Training Performed Yes   VAD Patient? No     VAD patient   Has back up controller? No     Pain Assessment   Currently in Pain? No/denies         Goals Met:  Proper associated with RPD/PD & O2 Sat Independence with exercise equipment Using PLB without cueing & demonstrates good technique Exercise tolerated well Strength training completed today  Goals Unmet:  Not Applicable  Comments: Pt able to follow exercise prescription today without complaint.  Will continue to monitor for progression.    Dr. Emily Filbert is Medical Director for [place] and [facility].

## 2016-08-19 ENCOUNTER — Encounter: Payer: Medicare Other | Attending: Specialist

## 2016-08-19 ENCOUNTER — Encounter: Payer: Self-pay | Admitting: *Deleted

## 2016-08-19 DIAGNOSIS — H409 Unspecified glaucoma: Secondary | ICD-10-CM | POA: Insufficient documentation

## 2016-08-19 DIAGNOSIS — I10 Essential (primary) hypertension: Secondary | ICD-10-CM | POA: Insufficient documentation

## 2016-08-19 DIAGNOSIS — J449 Chronic obstructive pulmonary disease, unspecified: Secondary | ICD-10-CM

## 2016-08-19 DIAGNOSIS — M810 Age-related osteoporosis without current pathological fracture: Secondary | ICD-10-CM | POA: Insufficient documentation

## 2016-08-19 NOTE — Progress Notes (Signed)
Pulmonary Individual Treatment Plan  Patient Details  Name: DHANI IMEL MRN: [id number] Date of Birth: 02/09/1941 Referring Provider:   Flowsheet Row Documentation from 04/29/2016 in [facility] Cardiac and Pulmonary Rehab  Referring Provider  Dr. Raul Del      Initial Encounter Date:  Flowsheet Row Documentation from 04/29/2016 in [facility] Cardiac and Pulmonary Rehab  Date  01/09/16  Referring Provider  Dr. Raul Del      Visit Diagnosis: COPD, moderate ([place])  Moderate COPD (chronic obstructive pulmonary disease) ([place])  Patient's Home Medications on Admission:  Current Outpatient Prescriptions:  .  albuterol (PROAIR HFA) 108 (90 Base) MCG/ACT inhaler, Inhale 2 puffs into the lungs See admin instructions. Inhale 2 puffs every 4 to 6 hours as needed for shortness of breath/wheezing., Disp: , Rfl:  .  aspirin EC 81 MG tablet, Take 81 mg by mouth at bedtime. , Disp: , Rfl:  .  Cholecalciferol (VITAMIN D3) 1000 units CAPS, Take 1,000 Units by mouth daily. , Disp: , Rfl:  .  cyclobenzaprine (FLEXERIL) 5 MG tablet, Take 5 mg by mouth daily as needed for muscle spasms. , Disp: , Rfl:  .  escitalopram (LEXAPRO) 10 MG tablet, Take 5-10 mg by mouth See admin instructions. Take 1/2 tablet  (5 mg) by mouth every morning, and take 1 tablet by mouth every night at bedtime., Disp: , Rfl:  .  fluticasone furoate-vilanterol (BREO ELLIPTA) 100-25 MCG/INH AEPB, Inhale 1 puff into the lungs daily. , Disp: , Rfl:  .  ipratropium-albuterol (DUONEB) 0.5-2.5 (3) MG/3ML SOLN, Take 3 mLs by nebulization 2 (two) times daily as needed. For shortness of breath/wheezing., Disp: , Rfl:  .  irbesartan-hydrochlorothiazide (AVALIDE) 150-12.5 MG tablet, Take 0.5 tablets by mouth daily. , Disp: , Rfl:  .  LUMIGAN 0.01 % SOLN, Place 1 drop into both eyes at bedtime., Disp: , Rfl:  .  Melatonin 5 MG TABS, Take 5 mg by mouth at bedtime as needed. For sleep., Disp: , Rfl:  .  Multiple Vitamins-Minerals (STRESSTABS ADVANCED) TABS, Take  1 tablet by mouth daily., Disp: , Rfl:  .  Probiotic Product (PHILLIPS COLON HEALTH PO), Take 1 capsule by mouth daily. , Disp: , Rfl:   Past Medical History: Past Medical History:  Diagnosis Date  . COPD (chronic obstructive pulmonary disease) ([place])   . Glaucoma   . Hypertension   . Osteoporosis     Tobacco Use: History  Smoking Status  . Former Smoker  . Quit date: 04/20/2012  Smokeless Tobacco  . Not on file    Labs: Recent Review Flowsheet Data    There is no flowsheet data to display.       ADL UCSD:   Pulmonary Function Assessment:   Exercise Target Goals:    Exercise Program Goal: Individual exercise prescription set with THRR, safety & activity barriers. Participant demonstrates ability to understand and report RPE using BORG scale, to self-measure pulse accurately, and to acknowledge the importance of the exercise prescription.  Exercise Prescription Goal: Starting with aerobic activity 30 plus minutes a day, 3 days per week for initial exercise prescription. Provide home exercise prescription and guidelines that participant acknowledges understanding prior to discharge.  Activity Barriers & Risk Stratification:   6 Minute Walk:   Initial Exercise Prescription:     Initial Exercise Prescription - 04/29/16 1400      Date of Initial Exercise RX and Referring Provider   Date 01/09/16   Referring Provider Dr. Raul Del      Perform Capillary  Blood Glucose checks as needed.  Exercise Prescription Changes:     Exercise Prescription Changes    Row Name 03/15/16 1300 03/29/16 1200 04/05/16 1200 04/19/16 1130 07/17/16 1300     Exercise Review   Progression  -  -  -  - Yes     Response to Exercise   Blood Pressure (Admit)  -  -  - 142/70  -   Blood Pressure (Exercise)  -  -  - 122/60 118/60   Blood Pressure (Exit)  -  -  - 120/60 120/70   Heart Rate (Admit)  -  -  - 115 bpm 83 bpm   Heart Rate (Exercise)  -  -  - 118 bpm 124 bpm   Heart Rate  (Exit)  -  -  - 97 bpm 91 bpm   Oxygen Saturation (Admit)  -  -  - 97 % 94 %   Oxygen Saturation (Exercise)  -  -  - 95 % 94 %   Oxygen Saturation (Exit)  -  -  - 97 % 95 %   Rating of Perceived Exertion (Exercise)  -  -  - 13 14   Perceived Dyspnea (Exercise)  -  -  - 4 4   Symptoms  -  -  - mild headache  -   Intensity  -  -  - THRR New  119-137 THRR unchanged     Progression   Progression Continue progressive overload as per policy without signs/symptoms or physical distress. Continue progressive overload as per policy without signs/symptoms or physical distress. Continue progressive overload as per policy without signs/symptoms or physical distress. Continue to progress workloads to maintain intensity without signs/symptoms of physical distress. Continue to progress workloads to maintain intensity without signs/symptoms of physical distress.     Resistance Training   Training Prescription  -  - Yes Yes Yes   Weight  -  - _0 Reps  -  - 10-12 10-12 10-12     Interval Training   Interval Training  -  -  -  - No     Treadmill   MPH 1.2 1.2 1.3  - 1.3   Grade 0 0 0  - 0   Minutes _1 - 15     NuStep   Level  - 2 2  - 3   Watts  - 40 25  -  -   Minutes  - 15 15  - 15     Biostep-RELP   Level _2 - 3   Watts _3 -  -   Minutes _4 - 15   METs  -  -  -  - 2   Row Name 07/30/16 1500 07/31/16 1400 08/15/16 1000         Exercise Review   Progression Yes Yes Yes       Response to Exercise   Blood Pressure (Admit) 120/64  - 124/70     Blood Pressure (Exercise) 158/82  - 128/82     Blood Pressure (Exit) 100/60  - 118/66     Heart Rate (Admit) 109 bpm  - 104 bpm     Heart Rate (Exercise) 123 bpm  - 119 bpm     Heart Rate (Exit) 104 bpm  - 100 bpm     Oxygen Saturation (Admit) 96 %  - 94 %     Oxygen  Saturation (Exercise) 91 %  - 88 %     Oxygen Saturation (Exit) 94 %  - 96 %     Rating of Perceived Exertion (Exercise) 13  - 12     Perceived Dyspnea  (Exercise) 3  - 3     Symptoms  - none none     Comments  - Home Exercise Guidelines given 07/31/16  -     Intensity THRR unchanged THRR unchanged THRR unchanged       Progression   Progression Continue to progress workloads to maintain intensity without signs/symptoms of physical distress. Continue to progress workloads to maintain intensity without signs/symptoms of physical distress. Continue to progress workloads to maintain intensity without signs/symptoms of physical distress.       Resistance Training   Training Prescription Yes Yes Yes     Weight _0 Reps 10-12 10-12 10-15       Interval Training   Interval Training No No No       Treadmill   MPH 1.4 1.4 1.5     Grade 0 0 0.5     Minutes _1 NuStep   Level _2 Minutes _3 Biostep-RELP   Level _4 Minutes _5 Home Exercise Plan   Plans to continue exercise at  - Home  walking  -     Frequency  - Add 2 additional days to program exercise sessions.  -        Exercise Comments:     Exercise Comments    Row Name 04/24/16 1212 05/22/16 1348 06/06/16 1417 06/21/16 1020 07/04/16 1309   Exercise Comments Ms Lagasse has been on a heart monitor and may have a cath per phone call to [place] - she will return when cleared to exercise and exercise will be modified if needed. Ms Cutrona last visit was 04/19/16 Ms Turnbaugh last visit was 04/19/16.  She has an appointment with cardiologist 8/1 and will get clearance befor ereturning to [place]. Ms Daponte has not attended since 04/19/16 Ms Shareef has not attended [facility] since 04/19/16.   [place] Name 07/10/16 1444 07/17/16 1342 07/30/16 1552 07/31/16 1424 08/15/16 1048   Exercise Comments Lindsey returned today without any problems. Shahla has done well her first 2 days [facility] to [facility]. Sephira is progressing well with exercise. Reviewed home exercise with pt today.  Pt plans to walk at home for exercise.  Reviewed THR, pulse, RPE, sign and symptoms,  and when to call 911 or MD.  Also discussed weather considerations and indoor options.  Pt voiced understanding. Courney is progressing well with exercise.      Discharge Exercise Prescription (Final Exercise Prescription Changes):     Exercise Prescription Changes - 08/15/16 1000      Exercise Review   Progression Yes     Response to Exercise   Blood Pressure (Admit) 124/70   Blood Pressure (Exercise) 128/82   Blood Pressure (Exit) 118/66   Heart Rate (Admit) 104 bpm   Heart Rate (Exercise) 119 bpm   Heart Rate (Exit) 100 bpm   Oxygen Saturation (Admit) 94 %   Oxygen Saturation (Exercise) 88 %   Oxygen Saturation (Exit) 96 %   Rating of Perceived Exertion (Exercise) 12   Perceived Dyspnea (Exercise) 3  Symptoms none   Intensity THRR unchanged     Progression   Progression Continue to progress workloads to maintain intensity without signs/symptoms of physical distress.     Resistance Training   Training Prescription Yes   Weight 3   Reps 10-15     Interval Training   Interval Training No     Treadmill   MPH 1.5   Grade 0.5   Minutes 15     NuStep   Level 4   Minutes 15     Biostep-RELP   Level 3   Minutes 15       Nutrition:  Target Goals: Understanding of nutrition guidelines, daily intake of sodium '1500mg'$ , cholesterol '200mg'$ , calories 30% from fat and 7% or less from saturated fats, daily to have 5 or more servings of fruits and vegetables.  Biometrics:    Nutrition Therapy Plan and Nutrition Goals:   Nutrition Discharge: Rate Your Plate Scores:   Psychosocial: Target Goals: Acknowledge presence or absence of depression, maximize coping skills, provide positive support system. Participant is able to verbalize types and ability to use techniques and skills needed for reducing stress and depression.  Initial Review & Psychosocial Screening:   Quality of Life Scores:   PHQ-9: Recent Review Flowsheet Data    Depression screen [facility] 2/9  01/09/2016   Decreased Interest 2   Down, Depressed, Hopeless 1   PHQ - 2 Score 3   Altered sleeping 2   Tired, decreased energy 2   Change in appetite 1   Feeling bad or failure about yourself  0   Trouble concentrating 0   Moving slowly or fidgety/restless 0   Suicidal thoughts 0   PHQ-9 Score 8   Difficult doing work/chores Not difficult at all      Psychosocial Evaluation and Intervention:     Psychosocial Evaluation - 04/08/16 1249      Psychosocial Evaluation & Interventions   Interventions Encouraged to exercise with the program and follow exercise prescription;Relaxation education;Stress management education   Comments Counselor met with Ms. Espericueta today for initial psychosocial evalution.  She is a 75 yr old who was diagnosed with COPD in 2014.  Ms. Fendley lives alone since the death of her spouse this past 11-10-2023 from lung cancer.  She became very sedentary during that time and her health issues increased as a result.  She has multiple health issues including vision & digestive  problems as well as anxiety .  She reports taking medications for all of these - including the anxiety and that they are currently being effective.  Ms. Brumbaugh states she has trouble getting to sleep, but sleeps at least 7-8 hours per night.  She states this program has already improved her sleep slightly and increased her ability to do normal activities.  Ms. Bedgood is typically in a positive mood and continues to have anxiety in new situations primarily.  She has an appointment with a neurologist in the next few weeks to assess why she fainted twice over the past several months.  Ms. Sylvestre has goals to be more fit and more active.  She plans to look into follow up programs to maintain her progress upon completion of this program.    Continued Psychosocial Services Needed Yes  Ms. Imhof will benefit from the psychoeducational components of this program, especially anxiety and relaxation.         Psychosocial Re-Evaluation:     Psychosocial Re-Evaluation    Row Name 06/24/16 [phone] 07/17/16  1631 08/14/16 1325 08/14/16 1330       Psychosocial Re-Evaluation   Interventions  - Encouraged to attend Pulmonary Rehabilitation for the exercise;Relaxation education;Stress management education Encouraged to attend Pulmonary Rehabilitation for the exercise   encouraged Necia to talk to her PMD about her anxiety levels.  -    Comments Ms Mancillas has had medical issues and has not attended since 04/19/2016. IN talking with her several times, she states she is looking forward to returning to [place] for it has been so encouraging for her both mentally and physically. Milanna's psychosocial assessment reveals no barriers to participation in Pulmonary Rehab. Psychosocial areas that are currently affecting patient's rehab experience include concerns about treatment decisions, dealing with as evidenced by fear and worry about another syncopal episode occurring without warning.Stanton Kidney does continue to exhibit positive coping skills to deal with her psychosocial concerns. Offered emotional support and reassurance. Zahra does feel she is making progress toward Pulmonary Rehab goals, even though she has been out for several months because of syncopal episodes and evaluation. Psalm reports her health and activity level HAS NOT improved in the past 30 days, since she has not been in the program for that time period.Marland Kitchenas evidenced by patient's report of unchanged ability to exercise. Patient reports feeling positive about current and projected progression in Pulmonary Rehab. After reviewing the patient's treatment plan, the patient is making progress toward Pulmonary Rehab goals. Patient's rate of progress toward rehab goals is good. Plan of action to help patient continue to work towards rehab goals include time with psychosocial counselor to review the fear of another episode and to look at coping mechanisms to help decrease  fears. . Will continue to monitor and evaluate progress toward psychosocial goal(s). Patient psychosocial assessment reveals no barriers to participation in Pulmonary Rehab. Psychosocial areas that are currently affecting patient's rehab experience include concerns about treatment decisions, and emotional problems as evidenced by fear of another syncopal episode occurring. Patient does continue to exhibit positive coping skills to deal with her psychosocial concerns. Offered emotional support and reassurance. Patient does feel she is making progress toward Pulmonary Rehab goals. Patient reports her health and activity level has improved in the past 30 days as evidenced by patient's report of increasedability to complete her exercise prescription and improved ability to manage daily activities.  Patient reports  feeling positive about current and projected progression in Pulmonary Rehab. After reviewing the patient's treatment plan, the patient is making progress toward Pulmonary Rehab goals. Patient's rate of progress toward rehab goals is excellent. Plan of action to help patient continue to work towards rehab goals include decreasing fear/anxiety of another syncopal episode. Advised Jerita to talk to her primary MD about her "shoulders rising up to her ears" because of her anxiety. Will also have psychosocial staff talk to [facility] for any resource/techniques to help reduce anxiety. Will continue to monitor and evaluate progress toward psychosocial goal(s). Jakyrah did for first time since syncopal episodes take her grandchildren with her in her car.     Continued Psychosocial Services Needed  - Yes Yes  -      Education: Education Goals: Education classes will be provided on a weekly basis, covering required topics. Participant will state understanding/return demonstration of topics presented.  Learning Barriers/Preferences:   Education Topics: Initial Evaluation Education: - Verbal, written and demonstration  of respiratory meds, RPE/PD scales, oximetry and breathing techniques. Instruction on use of nebulizers and MDIs: cleaning and proper use, rinsing mouth with steroid doses  and importance of monitoring MDI activations. Flowsheet Row Pulmonary Rehab from 08/07/2016 in [facility] Cardiac and Pulmonary Rehab  Date  01/09/16  Educator  LB  Instruction Review Code  2- meets goals/outcomes      General Nutrition Guidelines/Fats and Fiber: -Group instruction provided by verbal, written material, models and posters to present the general guidelines for heart healthy nutrition. Gives an explanation and review of dietary fats and fiber.   Controlling Sodium/Reading Food Labels: -Group verbal and written material supporting the discussion of sodium use in heart healthy nutrition. Review and explanation with models, verbal and written materials for utilization of the food label.   Exercise Physiology & Risk Factors: - Group verbal and written instruction with models to review the exercise physiology of the cardiovascular system and associated critical values. Details cardiovascular disease risk factors and the goals associated with each risk factor. Flowsheet Row Pulmonary Rehab from 08/07/2016 in [facility] Cardiac and Pulmonary Rehab  Date  07/10/16  Educator  AS  Instruction Review Code  2- meets goals/outcomes      Aerobic Exercise & Resistance Training: - Gives group verbal and written discussion on the health impact of inactivity. On the components of aerobic and resistive training programs and the benefits of this training and how to safely progress through these programs. Flowsheet Row Pulmonary Rehab from 08/07/2016 in [facility] Cardiac and Pulmonary Rehab  Date  08/07/16  Educator  JH/AS  Instruction Review Code  2- meets goals/outcomes      Flexibility, Balance, General Exercise Guidelines: - Provides group verbal and written instruction on the benefits of flexibility and balance training programs.  Provides general exercise guidelines with specific guidelines to those with heart or lung disease. Demonstration and skill practice provided. Flowsheet Row Pulmonary Rehab from 08/07/2016 in [facility] Cardiac and Pulmonary Rehab  Date  03/27/16  Educator  Salley Hews, PT`  Instruction Review Code  2- meets goals/outcomes      Stress Management: - Provides group verbal and written instruction about the health risks of elevated stress, cause of high stress, and healthy ways to reduce stress.   Depression: - Provides group verbal and written instruction on the correlation between heart/lung disease and depressed mood, treatment options, and the stigmas associated with seeking treatment.   Exercise & Equipment Safety: - Individual verbal instruction and demonstration of equipment use and safety with use of the equipment.   Infection Prevention: - Provides verbal and written material to individual with discussion of infection control including proper hand washing and proper equipment cleaning during exercise session.   Falls Prevention: - Provides verbal and written material to individual with discussion of falls prevention and safety. Flowsheet Row Pulmonary Rehab from 08/07/2016 in [facility] Cardiac and Pulmonary Rehab  Date  01/09/16  Educator  LB  Instruction Review Code  2- meets goals/outcomes      Diabetes: - Individual verbal and written instruction to review signs/symptoms of diabetes, desired ranges of glucose level fasting, after meals and with exercise. Advice that pre and post exercise glucose checks will be done for 3 sessions at entry of program.   Chronic Lung Diseases: - Group verbal and written instruction to review new updates, new respiratory medications, new advancements in procedures and treatments. Provide informative websites and "800" numbers of self-education.   Lung Procedures: - Group verbal and written instruction to describe testing methods done to diagnose  lung disease. Review the outcome of test results. Describe the treatment choices: Pulmonary Function Tests, ABGs and oximetry. Flowsheet Row  Pulmonary Rehab from 08/07/2016 in [facility] Cardiac and Pulmonary Rehab  Date  04/12/16  Educator  [place]  Instruction Review Code  2- meets goals/outcomes      Energy Conservation: - Provide group verbal and written instruction for methods to conserve energy, plan and organize activities. Instruct on pacing techniques, use of adaptive equipment and posture/positioning to relieve shortness of breath.   Triggers: - Group verbal and written instruction to review types of environmental controls: home humidity, furnaces, filters, dust mite/pet prevention, HEPA vacuums. To discuss weather changes, air quality and the benefits of nasal washing. Flowsheet Row Pulmonary Rehab from 08/07/2016 in [facility] Cardiac and Pulmonary Rehab  Date  07/29/16  Educator  LB  Instruction Review Code  2- meets goals/outcomes      Exacerbations: - Group verbal and written instruction to provide: warning signs, infection symptoms, calling MD promptly, preventive modes, and value of vaccinations. Review: effective airway clearance, coughing and/or vibration techniques. Create an Sports administrator.   Oxygen: - Individual and group verbal and written instruction on oxygen therapy. Includes supplement oxygen, available portable oxygen systems, continuous and intermittent flow rates, oxygen safety, concentrators, and Medicare reimbursement for oxygen.   Respiratory Medications: - Group verbal and written instruction to review medications for lung disease. Drug class, frequency, complications, importance of spacers, rinsing mouth after steroid MDI's, and proper cleaning methods for nebulizers. Flowsheet Row Pulmonary Rehab from 08/07/2016 in [facility] Cardiac and Pulmonary Rehab  Date  01/09/16  Educator  LB  Instruction Review Code  2- meets goals/outcomes      AED/CPR: - Group verbal and  written instruction with the use of models to demonstrate the basic use of the AED with the basic ABC's of resuscitation. Flowsheet Row Pulmonary Rehab from 08/07/2016 in [facility] Cardiac and Pulmonary Rehab  Date  04/05/16  Educator  CE  Instruction Review Code  2- meets goals/outcomes      Breathing Retraining: - Provides individuals verbal and written instruction on purpose, frequency, and proper technique of diaphragmatic breathing and pursed-lipped breathing. Applies individual practice skills. Flowsheet Row Pulmonary Rehab from 08/07/2016 in [facility] Cardiac and Pulmonary Rehab  Date  01/09/16  Educator  LB  Instruction Review Code  2- meets goals/outcomes      Anatomy and Physiology of the Lungs: - Group verbal and written instruction with the use of models to provide basic lung anatomy and physiology related to function, structure and complications of lung disease. Flowsheet Row Pulmonary Rehab from 08/07/2016 in [facility] Cardiac and Pulmonary Rehab  Date  07/12/16  Educator  [place]  Instruction Review Code  2- meets goals/outcomes      Heart Failure: - Group verbal and written instruction on the basics of heart failure: signs/symptoms, treatments, explanation of ejection fraction, enlarged heart and cardiomyopathy. Flowsheet Row Pulmonary Rehab from 08/07/2016 in [facility] Cardiac and Pulmonary Rehab  Date  07/19/16  Educator  [place]  Instruction Review Code  2- meets goals/outcomes      Sleep Apnea: - Individual verbal and written instruction to review Obstructive Sleep Apnea. Review of risk factors, methods for diagnosing and types of masks and machines for OSA.   Anxiety: - Provides group, verbal and written instruction on the correlation between heart/lung disease and anxiety, treatment options, and management of anxiety.   Relaxation: - Provides group, verbal and written instruction about the benefits of relaxation for patients with heart/lung disease. Also provides patients with  examples of relaxation techniques. Flowsheet Row Pulmonary Rehab from 08/07/2016 in [facility] Cardiac and  Pulmonary Rehab  Date  04/17/16  Educator  Elissa Hefty  Instruction Review Code  2- Meets goals/outcomes      Knowledge Questionnaire Score:    Core Components/Risk Factors/Patient Goals at Admission:   Core Components/Risk Factors/Patient Goals Review:      Goals and Risk Factor Review    Row Name 03/27/16 1457 04/08/16 1254 04/12/16 1242 04/12/16 1515 04/19/16 1217     Core Components/Risk Factors/Patient Goals Review   Personal Goals Review Sedentary;Develop more efficient breathing techniques such as purse lipped breathing and diaphragmatic breathing and practicing self-pacing with activity.;Improve shortness of breath with ADL's;Increase knowledge of respiratory medications and ability to use respiratory devices properly. Sedentary;Increase Strength and Stamina;Improve shortness of breath with ADL's  - Develop more efficient breathing techniques such as purse lipped breathing and diaphragmatic breathing and practicing self-pacing with activity. (P)  Increase knowledge of respiratory medications and ability to use respiratory devices properly.   Review Ms Doxtater is improving her stamina. She has not started back to the bike water exercising, but plans to return to that after [facility]. She paces herself at home with activites tto minimalize her shortness of breath and uses PLB with her exercise goals. We discussed her MDI's and was given an aerochamber for her Proventil. I will check on the Breo  to see if appropriate tto use a spacer with this inhaler. [facility] informed staff today that yesterday she reached a milestone.  She was able to make her bed without stopping whereas before [facility] she would have to stop at least two times.  She stated she was really happy yesterday about this improvement/change.  Also she usually dumps cloths on the bed after getting out fo dryer.  Now she is able to  fold them up too.  Not able to put away yet., but stated she is happy she is able to fold colthes up now.  She noticed that she is able to do more housework and able to breathe easier.  Antania states she is unable to walk at home due to balance issues.  She is scheduled to see a Neurologist in the near future about her balance.  Shanel reports using 3 pound weights at home.  She states she enjoyes working out with weights and is feeling stronger.   Cherree blood pressure is stable . c/o heart rate 100 at home just walking. Heart rate upon arrival is 110 most days. Adelise says she drinks a lot of water every day so she doesn't feel she is dehydrated.  I suggested she talk to her MD since she has not had her thyroid level checked lately. Kenyatte reports she had a monitor on in Jan or Feb but she wasn't told by her MD that it showed anything. She drinks decaf coffee only in am but on albuteral. Jannae also chews Nicotine gum since even thought she quit smoking 3 years ago she reports "I wake up every morning craving a cigarette this long".  Reviewed PLB with Ms. Branum.  She has good technique when performing PLB and knows when to use it.   -   Expected Outcomes  - Continue with [facility] prescription in order to achieve her goals; improve her stamina; increase her activities; improve her energy/stamina; and decrease her SOB.   Continue using PLB with ADLS and Exercise.   Heart rate 80-90. Blood pressure cont to be stable.  This will decrease her WOB and SOB while exercising and performing ADLs.  -   Row Name 04/19/16  1243 07/12/16 1455 08/02/16 1254 08/07/16 1305 08/14/16 1327     Core Components/Risk Factors/Patient Goals Review   Personal Goals Review Increase knowledge of respiratory medications and ability to use respiratory devices properly. Hypertension;Sedentary;Increase Strength and Stamina Sedentary;Increase Strength and Stamina Hypertension Increase knowledge of respiratory medications and ability to use respiratory  devices properly.;Improve shortness of breath with ADL's;Develop more efficient breathing techniques such as purse lipped breathing and diaphragmatic breathing and practicing self-pacing with activity.   Review Reviewed medications with Ms. Skolnik.  She takes Albuterol, Breo and Duoneb at home.  She has a spacer for ther MDI's.  She is taking her medications as prescribed and does not have any new questions or concerns with her medications. Valentina has been out for 3 months and states it is hard starting back.  Bps have been good in [facility] classes. Selisa has not started walking at home. She is looking for her walker to be able to take with her for balance.  She is doing handweights and is planning to start a gait and balance program after graduating. Adelheid's blood pressure was elevated slightly today at 130/70 but normal for her is 112/70. Destynee is doing well with her medication compliance and MDI use. She demonstrates great PLB technique while exercising.  She has stated that she has had to use her neulizer more often this week. She wil talk to her Pulmonary MD tomorrow about her nebulizer use.   Expected Outcomes Taking her medications as prescribed will decrease her exacerabations and SOB. Denajah will continue to improve strength and stamina with regular participation in [place]. Wallace will continue to come to exercise to improve stamina and start walking at home. Stable blood pressure. Continues compliance with her medications. Changes as physican orders for use of nebulizer, Continues use of PLB with her activities.      Core Components/Risk Factors/Patient Goals at Discharge (Final Review):      Goals and Risk Factor Review - 08/14/16 1327      Core Components/Risk Factors/Patient Goals Review   Personal Goals Review Increase knowledge of respiratory medications and ability to use respiratory devices properly.;Improve shortness of breath with ADL's;Develop more efficient breathing techniques such as  purse lipped breathing and diaphragmatic breathing and practicing self-pacing with activity.   Review Mariadelcarmen is doing well with her medication compliance and MDI use. She demonstrates great PLB technique while exercising.  She has stated that she has had to use her neulizer more often this week. She wil talk to her Pulmonary MD tomorrow about her nebulizer use.   Expected Outcomes Continues compliance with her medications. Changes as physican orders for use of nebulizer, Continues use of PLB with her activities.      ITP Comments:     ITP Comments    Row Name 04/12/16 1237 05/22/16 1348 06/03/16 0745 06/06/16 1418 06/21/16 1021   ITP Comments Aryan c/o heart rate 100 at home just walking. I suggested she talk to her MD since she has not had her thyroid level checked lately. Jacinta reports she had a monitor on in Jan or Feb but she wasn't told by her MD that it showed anything. She drinks decaf coffee only in am but on albuteral. Valaria also chews Nicotine gum since even thought she quit smoking 3 years ago she reports "I wake up every morning craving a cigarette this long".  Ms Kleppe last visit was 04/19/16 Ms Fant called on 05/30/2016 to update her medical issues: she has just got off her  30 day heart monitor; she has an appointment with her cardiologist on 06/18/2016; and then she will know when she can return to [place]. Ms Cordoba last visit was 04/19/16.  She has an appointment with cardiologist 8/1 and will get clearance befor ereturning to [place]. Ms Victorino has not attended since 04/19/16   Row Name 07/04/16 1309 08/15/16 1049         ITP Comments Ms Duck has not attended [facility] since 04/19/16. Skyylar is progressing well with exercise.         Comments: 30 Day Review

## 2016-08-21 ENCOUNTER — Encounter: Payer: Self-pay | Admitting: Respiratory Therapy

## 2016-08-21 DIAGNOSIS — J449 Chronic obstructive pulmonary disease, unspecified: Secondary | ICD-10-CM

## 2016-08-21 NOTE — Progress Notes (Signed)
Pulmonary Individual Treatment Plan  Patient Details  Name: TRISTON LISANTI MRN: [id number] Date of Birth: 1941/03/13 Referring Provider:   Arn Medal Row Documentation from 04/29/2016 in [facility] Cardiac and Pulmonary Rehab  Referring Provider  Dr. Raul Del      Initial Encounter Date:  Flowsheet Row Documentation from 04/29/2016 in [facility] Cardiac and Pulmonary Rehab  Date  01/09/16  Referring Provider  Dr. Raul Del      Visit Diagnosis: COPD, moderate ([place])  Patient's Home Medications on Admission:  Current Outpatient Prescriptions:    albuterol (PROAIR HFA) 108 (90 Base) MCG/ACT inhaler, Inhale 2 puffs into the lungs See admin instructions. Inhale 2 puffs every 4 to 6 hours as needed for shortness of breath/wheezing., Disp: , Rfl:    aspirin EC 81 MG tablet, Take 81 mg by mouth at bedtime. , Disp: , Rfl:    Cholecalciferol (VITAMIN D3) 1000 units CAPS, Take 1,000 Units by mouth daily. , Disp: , Rfl:    cyclobenzaprine (FLEXERIL) 5 MG tablet, Take 5 mg by mouth daily as needed for muscle spasms. , Disp: , Rfl:    escitalopram (LEXAPRO) 10 MG tablet, Take 5-10 mg by mouth See admin instructions. Take 1/2 tablet  (5 mg) by mouth every morning, and take 1 tablet by mouth every night at bedtime., Disp: , Rfl:    fluticasone furoate-vilanterol (BREO ELLIPTA) 100-25 MCG/INH AEPB, Inhale 1 puff into the lungs daily. , Disp: , Rfl:    ipratropium-albuterol (DUONEB) 0.5-2.5 (3) MG/3ML SOLN, Take 3 mLs by nebulization 2 (two) times daily as needed. For shortness of breath/wheezing., Disp: , Rfl:    irbesartan-hydrochlorothiazide (AVALIDE) 150-12.5 MG tablet, Take 0.5 tablets by mouth daily. , Disp: , Rfl:    LUMIGAN 0.01 % SOLN, Place 1 drop into both eyes at bedtime., Disp: , Rfl:    Melatonin 5 MG TABS, Take 5 mg by mouth at bedtime as needed. For sleep., Disp: , Rfl:    Multiple Vitamins-Minerals (STRESSTABS ADVANCED) TABS, Take 1 tablet by mouth daily., Disp: , Rfl:    Probiotic Product  ([place]), Take 1 capsule by mouth daily. , Disp: , Rfl:   Past Medical History: Past Medical History:  Diagnosis Date   COPD (chronic obstructive pulmonary disease) ([place])    Glaucoma    Hypertension    Osteoporosis     Tobacco Use: History  Smoking Status   Former Smoker   Quit date: 04/20/2012  Smokeless Tobacco   Not on file    Labs: Recent Review Flowsheet Data    There is no flowsheet data to display.       ADL UCSD:   Pulmonary Function Assessment:   Exercise Target Goals:    Exercise Program Goal: Individual exercise prescription set with THRR, safety & activity barriers. Participant demonstrates ability to understand and report RPE using BORG scale, to self-measure pulse accurately, and to acknowledge the importance of the exercise prescription.  Exercise Prescription Goal: Starting with aerobic activity 30 plus minutes a day, 3 days per week for initial exercise prescription. Provide home exercise prescription and guidelines that participant acknowledges understanding prior to discharge.  Activity Barriers & Risk Stratification:   6 Minute Walk:   Initial Exercise Prescription:     Initial Exercise Prescription - 04/29/16 1400      Date of Initial Exercise RX and Referring Provider   Date 01/09/16   Referring Provider Dr. Raul Del      Perform Capillary Blood Glucose checks as needed.  Exercise Prescription  Changes:     Exercise Prescription Changes    Row Name 03/15/16 1300 03/29/16 1200 04/05/16 1200 04/19/16 1130 07/17/16 1300     Exercise Review   Progression  --  --  --  -- Yes     Response to Exercise   Blood Pressure (Admit)  --  --  -- 142/70  --   Blood Pressure (Exercise)  --  --  -- 122/60 118/60   Blood Pressure (Exit)  --  --  -- 120/60 120/70   Heart Rate (Admit)  --  --  -- 115 bpm 83 bpm   Heart Rate (Exercise)  --  --  -- 118 bpm 124 bpm   Heart Rate (Exit)  --  --  -- 97 bpm 91 bpm   Oxygen  Saturation (Admit)  --  --  -- 97 % 94 %   Oxygen Saturation (Exercise)  --  --  -- 95 % 94 %   Oxygen Saturation (Exit)  --  --  -- 97 % 95 %   Rating of Perceived Exertion (Exercise)  --  --  -- 13 14   Perceived Dyspnea (Exercise)  --  --  -- 4 4   Symptoms  --  --  -- mild headache  --   Intensity  --  --  -- THRR New  119-137 THRR unchanged     Progression   Progression Continue progressive overload as per policy without signs/symptoms or physical distress. Continue progressive overload as per policy without signs/symptoms or physical distress. Continue progressive overload as per policy without signs/symptoms or physical distress. Continue to progress workloads to maintain intensity without signs/symptoms of physical distress. Continue to progress workloads to maintain intensity without signs/symptoms of physical distress.     Resistance Training   Training Prescription  --  -- Yes Yes Yes   Weight  --  -- _0 Reps  --  -- 10-12 10-12 10-12     Interval Training   Interval Training  --  --  --  -- No     Treadmill   MPH 1.2 1.2 1.3  -- 1.3   Grade 0 0 0  -- 0   Minutes _1 -- 15     NuStep   Level  -- 2 2  -- 3   Watts  -- 40 25  --  --   Minutes  -- 15 15  -- 15     Biostep-RELP   Level _2 -- 3   Watts _3 --  --   Minutes _4 -- 15   METs  --  --  --  -- 2   Row Name 07/30/16 1500 07/31/16 1400 08/15/16 1000         Exercise Review   Progression Yes Yes Yes       Response to Exercise   Blood Pressure (Admit) 120/64  -- 124/70     Blood Pressure (Exercise) 158/82  -- 128/82     Blood Pressure (Exit) 100/60  -- 118/66     Heart Rate (Admit) 109 bpm  -- 104 bpm     Heart Rate (Exercise) 123 bpm  -- 119 bpm     Heart Rate (Exit) 104 bpm  -- 100 bpm     Oxygen Saturation (Admit) 96 %  -- 94 %     Oxygen Saturation (Exercise) 91 %  -- 88 %  Oxygen Saturation (Exit) 94 %  -- 96 %     Rating of Perceived Exertion (Exercise) 13  -- 12      Perceived Dyspnea (Exercise) 3  -- 3     Symptoms  -- none none     Comments  -- Home Exercise Guidelines given 07/31/16  --     Intensity THRR unchanged THRR unchanged THRR unchanged       Progression   Progression Continue to progress workloads to maintain intensity without signs/symptoms of physical distress. Continue to progress workloads to maintain intensity without signs/symptoms of physical distress. Continue to progress workloads to maintain intensity without signs/symptoms of physical distress.       Resistance Training   Training Prescription Yes Yes Yes     Weight _0 Reps 10-12 10-12 10-15       Interval Training   Interval Training No No No       Treadmill   MPH 1.4 1.4 1.5     Grade 0 0 0.5     Minutes _1 NuStep   Level _2 Minutes _3 Biostep-RELP   Level _4 Minutes _5 Home Exercise Plan   Plans to continue exercise at  -- Home  walking  --     Frequency  -- Add 2 additional days to program exercise sessions.  --        Exercise Comments:     Exercise Comments    Row Name 04/24/16 1212 05/22/16 1348 06/06/16 1417 06/21/16 1020 07/04/16 1309   Exercise Comments Ms Oquendo has been on a heart monitor and may have a cath per phone call to Laureen - she will return when cleared to exercise and exercise will be modified if needed. Ms Finnie last visit was 04/19/16 Ms Grabski last visit was 04/19/16.  She has an appointment with cardiologist 8/1 and will get clearance befor ereturning to [place]. Ms Kensinger has not attended since 04/19/16 Ms Beazley has not attended [facility] since 04/19/16.   [place] Name 07/10/16 1444 07/17/16 1342 07/30/16 1552 07/31/16 1424 08/15/16 1048   Exercise Comments Iniya returned today without any problems. Alicya has done well her first 2 days back to [facility]. Nerida is progressing well with exercise. Reviewed home exercise with pt today.  Pt plans to walk at home for exercise.  Reviewed THR,  pulse, RPE, sign and symptoms, and when to call 911 or MD.  Also discussed weather considerations and indoor options.  Pt voiced understanding. Laquilla is progressing well with exercise.      Discharge Exercise Prescription (Final Exercise Prescription Changes):     Exercise Prescription Changes - 08/15/16 1000      Exercise Review   Progression Yes     Response to Exercise   Blood Pressure (Admit) 124/70   Blood Pressure (Exercise) 128/82   Blood Pressure (Exit) 118/66   Heart Rate (Admit) 104 bpm   Heart Rate (Exercise) 119 bpm   Heart Rate (Exit) 100 bpm   Oxygen Saturation (Admit) 94 %   Oxygen Saturation (Exercise) 88 %   Oxygen Saturation (Exit) 96 %   Rating of Perceived Exertion (Exercise) 12   Perceived Dyspnea (Exercise) 3   Symptoms none   Intensity THRR unchanged     Progression  Progression Continue to progress workloads to maintain intensity without signs/symptoms of physical distress.     Resistance Training   Training Prescription Yes   Weight 3   Reps 10-15     Interval Training   Interval Training No     Treadmill   MPH 1.5   Grade 0.5   Minutes 15     NuStep   Level 4   Minutes 15     Biostep-RELP   Level 3   Minutes 15       Nutrition:  Target Goals: Understanding of nutrition guidelines, daily intake of sodium <1533m, cholesterol <2085m calories 30% from fat and 7% or less from saturated fats, daily to have 5 or more servings of fruits and vegetables.  Biometrics:    Nutrition Therapy Plan and Nutrition Goals:   Nutrition Discharge: Rate Your Plate Scores:   Psychosocial: Target Goals: Acknowledge presence or absence of depression, maximize coping skills, provide positive support system. Participant is able to verbalize types and ability to use techniques and skills needed for reducing stress and depression.  Initial Review & Psychosocial Screening:   Quality of Life Scores:   PHQ-9: Recent Review Flowsheet Data     [facility] 01/09/2016   Decreased Interest 2   Down, Depressed, Hopeless 1   PHQ - 2 Score 3   Altered sleeping 2   Tired, decreased energy 2   Change in appetite 1   Feeling bad or failure about yourself  0   Trouble concentrating 0   Moving slowly or fidgety/restless 0   Suicidal thoughts 0   PHQ-9 Score 8   Difficult doing work/chores Not difficult at all      Psychosocial Evaluation and Intervention:     Psychosocial Evaluation - 04/08/16 1249      Psychosocial Evaluation & Interventions   Interventions Encouraged to exercise with the program and follow exercise prescription;Relaxation education;Stress management education   Comments Counselor met with Ms. JoMogensenoday for initial psychosocial evalution.  She is a 89r old who was diagnosed with COPD in 2014.  Ms. JoSelvyives alone since the death of her spouse this past DeDecember 26, 2024rom lung cancer.  She became very sedentary during that time and her health issues increased as a result.  She has multiple health issues including vision & digestive  problems as well as anxiety .  She reports taking medications for all of these - including the anxiety and that they are currently being effective.  Ms. JoHindletates she has trouble getting to sleep, but sleeps at least 7-8 hours per night.  She states this program has already improved her sleep slightly and increased her ability to do normal activities.  Ms. JoStolls typically in a positive mood and continues to have anxiety in new situations primarily.  She has an appointment with a neurologist in the next few weeks to assess why she fainted twice over the past several months.  Ms. JoToshas goals to be more fit and more active.  She plans to look into follow up programs to maintain her progress upon completion of this program.    Continued Psychosocial Services Needed Yes  Ms. JoSpadaforaill benefit from the psychoeducational components of this program, especially anxiety  and relaxation.        Psychosocial Re-Evaluation:     Psychosocial Re-Evaluation    Row Name 06/24/16 0854 07/17/16 1631 08/14/16 1325 08/14/16 1330       Psychosocial Re-Evaluation  Interventions  -- Encouraged to attend Pulmonary Rehabilitation for the exercise;Relaxation education;Stress management education Encouraged to attend Pulmonary Rehabilitation for the exercise   encouraged Aphrodite to talk to her PMD about her anxiety levels.  --    Comments Ms Rodriges has had medical issues and has not attended since 04/19/2016. IN talking with her several times, she states she is looking forward to returning to [place] for it has been so encouraging for her both mentally and physically. Jaice's psychosocial assessment reveals no barriers to participation in Pulmonary Rehab. Psychosocial areas that are currently affecting patient's rehab experience include concerns about treatment decisions, dealing with as evidenced by fear and worry about another syncopal episode occurring without warning.Stanton Kidney does continue to exhibit positive coping skills to deal with her psychosocial concerns. Offered emotional support and reassurance. Beverlyn does feel she is making progress toward Pulmonary Rehab goals, even though she has been out for several months because of syncopal episodes and evaluation. Jaclene reports her health and activity level HAS NOT improved in the past 30 days, since she has not been in the program for that time period.Marland Kitchenas evidenced by patient's report of unchanged ability to exercise. Patient reports feeling positive about current and projected progression in Pulmonary Rehab. After reviewing the patient's treatment plan, the patient is making progress toward Pulmonary Rehab goals. Patient's rate of progress toward rehab goals is good. Plan of action to help patient continue to work towards rehab goals include time with psychosocial counselor to review the fear of another episode and to look at coping  mechanisms to help decrease fears. . Will continue to monitor and evaluate progress toward psychosocial goal(s). Patient psychosocial assessment reveals no barriers to participation in Pulmonary Rehab. Psychosocial areas that are currently affecting patient's rehab experience include concerns about treatment decisions, and emotional problems as evidenced by fear of another syncopal episode occurring. Patient does continue to exhibit positive coping skills to deal with her psychosocial concerns. Offered emotional support and reassurance. Patient does feel she is making progress toward Pulmonary Rehab goals. Patient reports her health and activity level has improved in the past 30 days as evidenced by patient's report of increasedability to complete her exercise prescription and improved ability to manage daily activities.  Patient reports  feeling positive about current and projected progression in Pulmonary Rehab. After reviewing the patient's treatment plan, the patient is making progress toward Pulmonary Rehab goals. Patient's rate of progress toward rehab goals is excellent. Plan of action to help patient continue to work towards rehab goals include decreasing fear/anxiety of another syncopal episode. Advised Legaci to talk to her primary MD about her "shoulders rising up to her ears" because of her anxiety. Will also have psychosocial staff talk to [facility] for any resource/techniques to help reduce anxiety. Will continue to monitor and evaluate progress toward psychosocial goal(s). Albertina did for first time since syncopal episodes take her grandchildren with her in her car.     Continued Psychosocial Services Needed  -- Yes Yes  --      Education: Education Goals: Education classes will be provided on a weekly basis, covering required topics. Participant will state understanding/return demonstration of topics presented.  Learning Barriers/Preferences:   Education Topics: Initial Evaluation Education: -  Verbal, written and demonstration of respiratory meds, RPE/PD scales, oximetry and breathing techniques. Instruction on use of nebulizers and MDIs: cleaning and proper use, rinsing mouth with steroid doses and importance of monitoring MDI activations. Flowsheet Row Pulmonary Rehab from 08/07/2016 in [facility] Cardiac  and Pulmonary Rehab  Date  01/09/16  Educator  LB  Instruction Review Code  2- meets goals/outcomes      General Nutrition Guidelines/Fats and Fiber: -Group instruction provided by verbal, written material, models and posters to present the general guidelines for heart healthy nutrition. Gives an explanation and review of dietary fats and fiber.   Controlling Sodium/Reading Food Labels: -Group verbal and written material supporting the discussion of sodium use in heart healthy nutrition. Review and explanation with models, verbal and written materials for utilization of the food label.   Exercise Physiology & Risk Factors: - Group verbal and written instruction with models to review the exercise physiology of the cardiovascular system and associated critical values. Details cardiovascular disease risk factors and the goals associated with each risk factor. Flowsheet Row Pulmonary Rehab from 08/07/2016 in [facility] Cardiac and Pulmonary Rehab  Date  07/10/16  Educator  AS  Instruction Review Code  2- meets goals/outcomes      Aerobic Exercise & Resistance Training: - Gives group verbal and written discussion on the health impact of inactivity. On the components of aerobic and resistive training programs and the benefits of this training and how to safely progress through these programs. Flowsheet Row Pulmonary Rehab from 08/07/2016 in [facility] Cardiac and Pulmonary Rehab  Date  08/07/16  Educator  JH/AS  Instruction Review Code  2- meets goals/outcomes      Flexibility, Balance, General Exercise Guidelines: - Provides group verbal and written instruction on the benefits of flexibility  and balance training programs. Provides general exercise guidelines with specific guidelines to those with heart or lung disease. Demonstration and skill practice provided. Flowsheet Row Pulmonary Rehab from 08/07/2016 in [facility] Cardiac and Pulmonary Rehab  Date  03/27/16  Educator  Salley Hews, PT`  Instruction Review Code  2- meets goals/outcomes      Stress Management: - Provides group verbal and written instruction about the health risks of elevated stress, cause of high stress, and healthy ways to reduce stress.   Depression: - Provides group verbal and written instruction on the correlation between heart/lung disease and depressed mood, treatment options, and the stigmas associated with seeking treatment.   Exercise & Equipment Safety: - Individual verbal instruction and demonstration of equipment use and safety with use of the equipment.   Infection Prevention: - Provides verbal and written material to individual with discussion of infection control including proper hand washing and proper equipment cleaning during exercise session.   Falls Prevention: - Provides verbal and written material to individual with discussion of falls prevention and safety. Flowsheet Row Pulmonary Rehab from 08/07/2016 in [facility] Cardiac and Pulmonary Rehab  Date  01/09/16  Educator  LB  Instruction Review Code  2- meets goals/outcomes      Diabetes: - Individual verbal and written instruction to review signs/symptoms of diabetes, desired ranges of glucose level fasting, after meals and with exercise. Advice that pre and post exercise glucose checks will be done for 3 sessions at entry of program.   Chronic Lung Diseases: - Group verbal and written instruction to review new updates, new respiratory medications, new advancements in procedures and treatments. Provide informative websites and "800" numbers of self-education.   Lung Procedures: - Group verbal and written instruction to describe  testing methods done to diagnose lung disease. Review the outcome of test results. Describe the treatment choices: Pulmonary Function Tests, ABGs and oximetry. Flowsheet Row Pulmonary Rehab from 08/07/2016 in [facility] Cardiac and Pulmonary Rehab  Date  04/12/16  Educator  Kathleen  Instruction Review Code  2- meets goals/outcomes      Energy Conservation: - Provide group verbal and written instruction for methods to conserve energy, plan and organize activities. Instruct on pacing techniques, use of adaptive equipment and posture/positioning to relieve shortness of breath.   Triggers: - Group verbal and written instruction to review types of environmental controls: home humidity, furnaces, filters, dust mite/pet prevention, HEPA vacuums. To discuss weather changes, air quality and the benefits of nasal washing. Flowsheet Row Pulmonary Rehab from 08/07/2016 in [facility] Cardiac and Pulmonary Rehab  Date  07/29/16  Educator  LB  Instruction Review Code  2- meets goals/outcomes      Exacerbations: - Group verbal and written instruction to provide: warning signs, infection symptoms, calling MD promptly, preventive modes, and value of vaccinations. Review: effective airway clearance, coughing and/or vibration techniques. Create an Sports administrator.   Oxygen: - Individual and group verbal and written instruction on oxygen therapy. Includes supplement oxygen, available portable oxygen systems, continuous and intermittent flow rates, oxygen safety, concentrators, and Medicare reimbursement for oxygen.   Respiratory Medications: - Group verbal and written instruction to review medications for lung disease. Drug class, frequency, complications, importance of spacers, rinsing mouth after steroid MDI's, and proper cleaning methods for nebulizers. Flowsheet Row Pulmonary Rehab from 08/07/2016 in [facility] Cardiac and Pulmonary Rehab  Date  01/09/16  Educator  LB  Instruction Review Code  2- meets goals/outcomes       AED/CPR: - Group verbal and written instruction with the use of models to demonstrate the basic use of the AED with the basic ABC's of resuscitation. Flowsheet Row Pulmonary Rehab from 08/07/2016 in [facility] Cardiac and Pulmonary Rehab  Date  04/05/16  Educator  CE  Instruction Review Code  2- meets goals/outcomes      Breathing Retraining: - Provides individuals verbal and written instruction on purpose, frequency, and proper technique of diaphragmatic breathing and pursed-lipped breathing. Applies individual practice skills. Flowsheet Row Pulmonary Rehab from 08/07/2016 in [facility] Cardiac and Pulmonary Rehab  Date  01/09/16  Educator  LB  Instruction Review Code  2- meets goals/outcomes      Anatomy and Physiology of the Lungs: - Group verbal and written instruction with the use of models to provide basic lung anatomy and physiology related to function, structure and complications of lung disease. Flowsheet Row Pulmonary Rehab from 08/07/2016 in [facility] Cardiac and Pulmonary Rehab  Date  07/12/16  Educator  [place]  Instruction Review Code  2- meets goals/outcomes      Heart Failure: - Group verbal and written instruction on the basics of heart failure: signs/symptoms, treatments, explanation of ejection fraction, enlarged heart and cardiomyopathy. Flowsheet Row Pulmonary Rehab from 08/07/2016 in [facility] Cardiac and Pulmonary Rehab  Date  07/19/16  Educator  [place]  Instruction Review Code  2- meets goals/outcomes      Sleep Apnea: - Individual verbal and written instruction to review Obstructive Sleep Apnea. Review of risk factors, methods for diagnosing and types of masks and machines for OSA.   Anxiety: - Provides group, verbal and written instruction on the correlation between heart/lung disease and anxiety, treatment options, and management of anxiety.   Relaxation: - Provides group, verbal and written instruction about the benefits of relaxation for patients with heart/lung disease.  Also provides patients with examples of relaxation techniques. Flowsheet Row Pulmonary Rehab from 08/07/2016 in [facility] Cardiac and Pulmonary Rehab  Date  04/17/16  Educator  Elissa Hefty  Instruction Review Code  2- Meets goals/outcomes      Knowledge Questionnaire Score:    Core Components/Risk Factors/Patient Goals at Admission:   Core Components/Risk Factors/Patient Goals Review:      Goals and Risk Factor Review    Row Name 03/27/16 1457 04/08/16 1254 04/12/16 1242 04/12/16 1515 04/19/16 1217     Core Components/Risk Factors/Patient Goals Review   Personal Goals Review Sedentary;Develop more efficient breathing techniques such as purse lipped breathing and diaphragmatic breathing and practicing self-pacing with activity.;Improve shortness of breath with ADL's;Increase knowledge of respiratory medications and ability to use respiratory devices properly. Sedentary;Increase Strength and Stamina;Improve shortness of breath with ADL's  -- Develop more efficient breathing techniques such as purse lipped breathing and diaphragmatic breathing and practicing self-pacing with activity. (P)  Increase knowledge of respiratory medications and ability to use respiratory devices properly.   Review Ms Schelling is improving her stamina. She has not started back to the bike water exercising, but plans to return to that after [facility]. She paces herself at home with activites tto minimalize her shortness of breath and uses PLB with her exercise goals. We discussed her MDI's and was given an aerochamber for her Proventil. I will check on the Breo  to see if appropriate tto use a spacer with this inhaler. [facility] informed staff today that yesterday she reached a milestone.  She was able to make her bed without stopping whereas before [facility] she would have to stop at least two times.  She stated she was really happy yesterday about this improvement/change.  Also she usually dumps cloths on the bed after getting out  fo dryer.  Now she is able to fold them up too.  Not able to put away yet., but stated she is happy she is able to fold colthes up now.  She noticed that she is able to do more housework and able to breathe easier.  Tosca states she is unable to walk at home due to balance issues.  She is scheduled to see a Neurologist in the near future about her balance.  Ramey reports using 3 pound weights at home.  She states she enjoyes working out with weights and is feeling stronger.   Amel blood pressure is stable . c/o heart rate 100 at home just walking. Heart rate upon arrival is 110 most days. Jaysa says she drinks a lot of water every day so she doesn't feel she is dehydrated.  I suggested she talk to her MD since she has not had her thyroid level checked lately. Paxtyn reports she had a monitor on in Jan or Feb but she wasn't told by her MD that it showed anything. She drinks decaf coffee only in am but on albuteral. Krysten also chews Nicotine gum since even thought she quit smoking 3 years ago she reports "I wake up every morning craving a cigarette this long".  Reviewed PLB with Ms. Apperson.  She has good technique when performing PLB and knows when to use it.   --   Expected Outcomes  -- Continue with [facility] prescription in order to achieve her goals; improve her stamina; increase her activities; improve her energy/stamina; and decrease her SOB.   Continue using PLB with ADLS and Exercise.   Heart rate 80-90. Blood pressure cont to be stable.  This will decrease her WOB and SOB while exercising and performing ADLs.  --   Row Name 04/19/16 1243 07/12/16 1455 08/02/16 1254 08/07/16 1305 08/14/16 1327     Core Components/Risk Factors/Patient  Goals Review   Personal Goals Review Increase knowledge of respiratory medications and ability to use respiratory devices properly. Hypertension;Sedentary;Increase Strength and Stamina Sedentary;Increase Strength and Stamina Hypertension Increase knowledge of respiratory  medications and ability to use respiratory devices properly.;Improve shortness of breath with ADL's;Develop more efficient breathing techniques such as purse lipped breathing and diaphragmatic breathing and practicing self-pacing with activity.   Review Reviewed medications with Ms. Zeoli.  She takes Albuterol, Breo and Duoneb at home.  She has a spacer for ther MDI's.  She is taking her medications as prescribed and does not have any new questions or concerns with her medications. Kodi has been out for 3 months and states it is hard starting back.  Bps have been good in [facility] classes. Monika has not started walking at home. She is looking for her walker to be able to take with her for balance.  She is doing handweights and is planning to start a gait and balance program after graduating. Delphina's blood pressure was elevated slightly today at 130/70 but normal for her is 112/70. Wilmer is doing well with her medication compliance and MDI use. She demonstrates great PLB technique while exercising.  She has stated that she has had to use her neulizer more often this week. She wil talk to her Pulmonary MD tomorrow about her nebulizer use.   Expected Outcomes Taking her medications as prescribed will decrease her exacerabations and SOB. Jenella will continue to improve strength and stamina with regular participation in [place]. Tzirel will continue to come to exercise to improve stamina and start walking at home. Stable blood pressure. Continues compliance with her medications. Changes as physican orders for use of nebulizer, Continues use of PLB with her activities.      Core Components/Risk Factors/Patient Goals at Discharge (Final Review):      Goals and Risk Factor Review - 08/14/16 1327      Core Components/Risk Factors/Patient Goals Review   Personal Goals Review Increase knowledge of respiratory medications and ability to use respiratory devices properly.;Improve shortness of breath with ADL's;Develop  more efficient breathing techniques such as purse lipped breathing and diaphragmatic breathing and practicing self-pacing with activity.   Review Naryah is doing well with her medication compliance and MDI use. She demonstrates great PLB technique while exercising.  She has stated that she has had to use her neulizer more often this week. She wil talk to her Pulmonary MD tomorrow about her nebulizer use.   Expected Outcomes Continues compliance with her medications. Changes as physican orders for use of nebulizer, Continues use of PLB with her activities.      ITP Comments:     ITP Comments    Row Name 04/12/16 1237 05/22/16 1348 06/03/16 0745 06/06/16 1418 06/21/16 1021   ITP Comments Inella c/o heart rate 100 at home just walking. I suggested she talk to her MD since she has not had her thyroid level checked lately. Morayo reports she had a monitor on in Jan or Feb but she wasn't told by her MD that it showed anything. She drinks decaf coffee only in am but on albuteral. Pretty also chews Nicotine gum since even thought she quit smoking 3 years ago she reports "I wake up every morning craving a cigarette this long".  Ms Kulick last visit was 04/19/16 Ms Gomm called on 05/30/2016 to update her medical issues: she has just got off her 30 day heart monitor; she has an appointment with her cardiologist on 06/18/2016; and then she  will know when she can return to [place]. Ms Sivertson last visit was 04/19/16.  She has an appointment with cardiologist 8/1 and will get clearance befor ereturning to [place]. Ms Terhaar has not attended since 04/19/16   Row Name 07/04/16 1309 08/15/16 1049         ITP Comments Ms Utecht has not attended [facility] since 04/19/16. Bryton is progressing well with exercise.         Comments:

## 2016-08-28 ENCOUNTER — Encounter: Payer: Medicare Other | Admitting: *Deleted

## 2016-08-28 DIAGNOSIS — J449 Chronic obstructive pulmonary disease, unspecified: Secondary | ICD-10-CM | POA: Diagnosis not present

## 2016-08-28 DIAGNOSIS — I10 Essential (primary) hypertension: Secondary | ICD-10-CM | POA: Diagnosis not present

## 2016-08-28 DIAGNOSIS — H409 Unspecified glaucoma: Secondary | ICD-10-CM | POA: Diagnosis not present

## 2016-08-28 DIAGNOSIS — M810 Age-related osteoporosis without current pathological fracture: Secondary | ICD-10-CM | POA: Diagnosis not present

## 2016-08-28 NOTE — Progress Notes (Signed)
Daily Session Note  Patient Details  Name: Jessica Bowman MRN: [id number] Date of Birth: [birth year] Referring Provider:   Arn Medal Row Documentation from 04/29/2016 in [facility] Card[facility] and [facility] Re[facility]  Referring Provider  Dr. Raul Del      Encounter Date: 08/28/2016  Check In:     Session Check In - 08/28/16 1310      Check-In   Location [facility]-Cardiac & Pulmonary Rehab   Staff Present Gerlene Burdock, RN, BSN;Laureen Owens Shark, BS, RRT, Respiratory Lennie Hummer, MA, ACSM RCEP, Exercise Physiologist   Supervising physician immediately available to respond to emergencies [facility] immediately available ER MD   Physician(s) Dr. Reita Cliche and Dr Quentin Cornwall   Medication changes reported     No   Fall or balance concerns reported    No   Warm-up and Cool-down Performed as group-led instruction   Resistance Training Performed Yes   VAD Patient? No     VAD patient   Has back up controller? No         Goals Met:  Proper associated with RPD/PD & O2 Sat Exercise tolerated well  Goals Unmet:  Not Applicable  Comments:     Dr. Emily Filbert is Medical Director for [place] and [facility].

## 2016-08-30 ENCOUNTER — Encounter: Payer: Medicare Other | Admitting: *Deleted

## 2016-08-30 DIAGNOSIS — J449 Chronic obstructive pulmonary disease, unspecified: Secondary | ICD-10-CM | POA: Diagnosis not present

## 2016-08-30 NOTE — Progress Notes (Signed)
Daily Session Note  Patient Details  Name: Jessica Bowman MRN: [id number] Date of Birth: Oct 20, 1941 Referring Provider:   Arn Medal Row Documentation from 04/29/2016 in [facility] Card[facility] and Pul[facility] Rehab  Referring Provider  Dr. Raul Del      Encounter Date: 08/30/2016  Check In:     Session Check In - 08/30/16 1334      Check-In   Location [facility]-Cardiac & Pulmonary Rehab   Staff Present Alberteen Sam, MA, ACSM RCEP, Exercise Physiologist;Laureen Owens Shark, BS, RRT, Respiratory Therapist;Carroll Enterkin, RN, BSN   Supervising physician immediately available to respond to emergencies [facility] immediately available ER MD   Physician(s) Drs. Kinner and [employer]   Medication changes reported     No   Fall or balance concerns reported    No   Warm-up and Cool-down Performed as group-led Location manager Performed Yes   VAD Patient? No     Pain Assessment   Currently in Pain? No/denies   Multiple Pain Sites No         Goals Met:  Proper associated with RPD/PD & O2 Sat Independence with exercise equipment Using PLB without cueing & demonstrates good technique Exercise tolerated well Strength training completed today  Goals Unmet:  Not Applicable  Comments: Pt able to follow exercise prescription today without complaint.  Will continue to monitor for progression.    Dr. Emily Filbert is Medical Director for [place] and [facility].

## 2016-09-04 ENCOUNTER — Encounter: Payer: Medicare Other | Admitting: *Deleted

## 2016-09-04 DIAGNOSIS — J449 Chronic obstructive pulmonary disease, unspecified: Secondary | ICD-10-CM | POA: Diagnosis not present

## 2016-09-04 NOTE — Progress Notes (Signed)
Daily Session Note  Patient Details  Name: Jessica Bowman MRN: [id number] Date of Birth: December 10, 1940 Referring Provider:   Arn Medal Row Documentation from 04/29/2016 in [facility] Cardiac and Pulmonary Rehab  Referring Provider  Dr. Raul Del      Encounter Date: 09/04/2016  Check In:     Session Check In - 09/04/16 1359      Check-In   Location [facility]-Cardiac & Pulmonary Re[facility]   Staff Present Alberteen Sam, MA, ACSM RCEP, Exercise Physiologist;Amanda Oletta Darter, BA, ACSM CEP, Exercise Physiologist;Laureen Owens Shark, BS, RRT, Respiratory Therapist   Supervising physician immediately available to respond to emergencies [facility] immediately available ER MD   Physician(s) Drs. Quentin Cornwall and [place]   Medication changes reported     No   Fall or balance concerns reported    No   Warm-up and Cool-down Performed as group-led Location manager Performed Yes   VAD Patient? No     VAD patient   Has back up controller? No     Pain Assessment   Currently in Pain? No/denies   Multiple Pain Sites No         Goals Met:  Proper associated with RPD/PD & O2 Sat Independence with exercise equipment Using PLB without cueing & demonstrates good technique Exercise tolerated well Strength training completed today  Goals Unmet:  Not Applicable  Comments: Pt able to follow exercise prescription today without complaint.  Will continue to monitor for progression.    Dr. Emily Filbert is Medical Director for [place] and [facility].

## 2016-09-06 ENCOUNTER — Encounter: Payer: Medicare Other | Admitting: *Deleted

## 2016-09-06 DIAGNOSIS — J449 Chronic obstructive pulmonary disease, unspecified: Secondary | ICD-10-CM

## 2016-09-06 NOTE — Progress Notes (Signed)
Daily Session Note  Patient Details  Name: Jessica Bowman MRN: [id number] Date of Birth: 01/19/1941 Referring Provider:   Flowsheet Row Documentation from 04/29/2016 in [facility] Cardiac and Pulmonary Rehab  Referring Provider  Dr. Fleming      Encounter Date: 09/06/2016  Check In:     Session Check In - 09/06/16 1225      Check-In   Location [facility]-Cardiac & Pulmonary Re[facility]   Staff Present Anael Jo Abernethy, RN, BSN, MA;Jessica Hawkins, MA, ACSM RCEP, Exercise Physiologist;Carroll Enterkin, RN, BSN   Supervising physician immediately available to respond to emergencies [facility] immediately available ER MD   Physician(s) Dr. Robinson and DR. Quale   Medication changes reported     No   Fall or balance concerns reported    No   Warm-up and Cool-down Performed as group-led instruction   Resistance Training Performed Yes   VAD Patient? No     VAD patient   Has back up controller? No         Goals Met:  Proper associated with RPD/PD & O2 Sat Exercise tolerated well  Goals Unmet:  Not Applicable  Comments:     Dr. Mark Miller is Medical Director for [facility] and [facility]. 

## 2016-09-16 ENCOUNTER — Encounter: Payer: Medicare Other | Admitting: *Deleted

## 2016-09-16 ENCOUNTER — Encounter: Payer: Self-pay | Admitting: *Deleted

## 2016-09-16 DIAGNOSIS — J449 Chronic obstructive pulmonary disease, unspecified: Secondary | ICD-10-CM | POA: Diagnosis not present

## 2016-09-16 NOTE — Progress Notes (Signed)
Pulmonary Individual Treatment Plan  Patient Details  Name: Jessica Bowman MRN: [id number] Date of Birth: 08-16-1941 Referring Provider:   Flowsheet Row Documentation from 04/29/2016 in [facility] Card[facility] and [facility] Rehab  Referring Provider  Dr. Raul Del      Initial Encounter Date:  Flowsheet Row Documentation from 04/29/2016 in [facility] Cardiac and Pulmonary Rehab  Date  01/09/16  Referring Provider  Dr. Raul Del      Visit Diagnosis: COPD, moderate ([place])  Moderate COPD (chronic obstructive pulmonary disease) ([place] [address])  Patient's Home Medications on Admission:  Current Outpatient Prescriptions:  .  albuterol (PROAIR HFA) 108 (90 Base) MCG/ACT inhaler, Inhale 2 puffs into the lungs See admin instructions. Inhale 2 puffs every 4 to 6 hours as needed for shortness of breath/wheezing., Disp: , Rfl:  .  aspirin EC 81 MG tablet, Take 81 mg by mouth at bedtime. , Disp: , Rfl:  .  Cholecalciferol (VITAMIN D3) 1000 units CAPS, Take 1,000 Units by mouth daily. , Disp: , Rfl:  .  cyclobenzaprine (FLEXERIL) 5 MG tablet, Take 5 mg by mouth daily as needed for muscle spasms. , Disp: , Rfl:  .  escitalopram (LEXAPRO) 10 MG tablet, Take 5-10 mg by mouth See admin instructions. Take 1/2 tablet  (5 mg) by mouth every morning, and take 1 tablet by mouth every night at bedtime., Disp: , Rfl:  .  fluticasone furoate-vilanterol (BREO ELLIPTA) 100-25 MCG/INH AEPB, Inhale 1 puff into the lungs daily. , Disp: , Rfl:  .  ipratropium-albuterol (DUONEB) 0.5-2.5 (3) MG/3ML SOLN, Take 3 mLs by nebulization 2 (two) times daily as needed. For shortness of breath/wheezing., Disp: , Rfl:  .  irbesartan-hydrochlorothiazide (AVALIDE) 150-12.5 MG tablet, Take 0.5 tablets by mouth daily. , Disp: , Rfl:  .  LUMIGAN 0.01 % SOLN, Place 1 drop into both eyes at bedtime., Disp: , Rfl:  .  Melatonin 5 MG TABS, Take 5 mg by mouth at bedtime as needed. For sleep., Disp: , Rfl:  .  Multiple Vitamins-Minerals (STRESSTABS ADVANCED) TABS, Take  1 tablet by mouth daily., Disp: , Rfl:  .  Probiotic Product (PHILLIPS COLON HEALTH PO), Take 1 capsule by mouth daily. , Disp: , Rfl:   Past Medical History: Past Medical History:  Diagnosis Date  . COPD (chronic obstructive pulmonary disease) ([place])   . Glaucoma   . Hypertension   . Osteoporosis     Tobacco Use: History  Smoking Status  . Former Smoker  . Quit date: 04/20/2012  Smokeless Tobacco  . Not on file    Labs: Recent Review Flowsheet Data    There is no flowsheet data to display.       ADL UCSD:     Pulmonary Assessment Scores    Row Name 08/30/16 1337         ADL UCSD   ADL Phase Exit     SOB Score total 58     Rest 0     Walk 3     Stairs 4     Bath 1     Dress 1     Shop 1        Pulmonary Function Assessment:   Exercise Target Goals:    Exercise Program Goal: Individual exercise prescription set with THRR, safety & activity barriers. Participant demonstrates ability to understand and report RPE using BORG scale, to self-measure pulse accurately, and to acknowledge the importance of the exercise prescription.  Exercise Prescription Goal: Starting with aerobic activity 30 plus minutes a  day, 3 days per week for initial exercise prescription. Provide home exercise prescription and guidelines that participant acknowledges understanding prior to discharge.  Activity Barriers & Risk Stratification:   6 Minute Walk:   Initial Exercise Prescription:     Initial Exercise Prescription - 04/29/16 1400      Date of Initial Exercise RX and Referring Provider   Date 01/09/16   Referring Provider Dr. Raul Del      Perform Capillary Blood Glucose checks as needed.  Exercise Prescription Changes:     Exercise Prescription Changes    Row Name 03/29/16 1200 04/05/16 1200 04/19/16 1130 07/17/16 1300 07/30/16 1500     Exercise Review   Progression  -  -  - Yes Yes     Response to Exercise   Blood Pressure (Admit)  -  - 142/70  - 120/64    Blood Pressure (Exercise)  -  - 122/60 118/60 158/82   Blood Pressure (Exit)  -  - 120/60 120/70 100/60   Heart Rate (Admit)  -  - 115 bpm 83 bpm 109 bpm   Heart Rate (Exercise)  -  - 118 bpm 124 bpm 123 bpm   Heart Rate (Exit)  -  - 97 bpm 91 bpm 104 bpm   Oxygen Saturation (Admit)  -  - 97 % 94 % 96 %   Oxygen Saturation (Exercise)  -  - 95 % 94 % 91 %   Oxygen Saturation (Exit)  -  - 97 % 95 % 94 %   Rating of Perceived Exertion (Exercise)  -  - '13 14 13   ' Perceived Dyspnea (Exercise)  -  - '4 4 3   ' Symptoms  -  - mild headache  -  -   Intensity  -  - THRR New  119-137 THRR unchanged THRR unchanged     Progression   Progression Continue progressive overload as per policy without signs/symptoms or physical distress. Continue progressive overload as per policy without signs/symptoms or physical distress. Continue to progress workloads to maintain intensity without signs/symptoms of physical distress. Continue to progress workloads to maintain intensity without signs/symptoms of physical distress. Continue to progress workloads to maintain intensity without signs/symptoms of physical distress.     Resistance Training   Training Prescription  - Yes Yes Yes Yes   Weight  - '3 3 3 3   ' Reps  - 10-12 10-12 10-12 10-12     Interval Training   Interval Training  -  -  - No No     Treadmill   MPH 1.2 1.3  - 1.3 1.4   Grade 0 0  - 0 0   Minutes 12 15  - 15 15     NuStep   Level 2 2  - 3 3   Watts 40 25  -  -  -   Minutes 15 15  - 15 15     Biostep-RELP   Level 2 2  - 3 3   Watts 20 20  -  -  -   Minutes 15 15  - 15 15   METs  -  -  - 2  -   Row Name 07/31/16 1400 08/15/16 1000 08/28/16 1500 09/11/16 1600       Exercise Review   Progression Yes Yes Yes Yes      Response to Exercise   Blood Pressure (Admit)  - 124/70 118/66 138/70    Blood Pressure (Exercise)  - 128/82 150/80  150/78    Blood Pressure (Exit)  - 118/66 118/60 104/64    Heart Rate (Admit)  - 104 bpm 89 bpm 90 bpm     Heart Rate (Exercise)  - 119 bpm 124 bpm 118 bpm    Heart Rate (Exit)  - 100 bpm 106 bpm 93 bpm    Oxygen Saturation (Admit)  - 94 % 97 % 97 %    Oxygen Saturation (Exercise)  - 88 % 96 % 92 %    Oxygen Saturation (Exit)  - 96 % 96 % 94 %    Rating of Perceived Exertion (Exercise)  - '12 12 13    ' Perceived Dyspnea (Exercise)  - '3 3 3    ' Symptoms none none none none    Comments Home Exercise Guidelines given 07/31/16  - Home Exercise Guidelines given 07/31/16 Home Exercise Guidelines given 07/31/16    Duration  -  - Progress to 45 minutes of aerobic exercise without signs/symptoms of physical distress Progress to 45 minutes of aerobic exercise without signs/symptoms of physical distress    Intensity THRR unchanged THRR unchanged THRR unchanged THRR unchanged      Progression   Progression Continue to progress workloads to maintain intensity without signs/symptoms of physical distress. Continue to progress workloads to maintain intensity without signs/symptoms of physical distress. Continue to progress workloads to maintain intensity without signs/symptoms of physical distress. Continue to progress workloads to maintain intensity without signs/symptoms of physical distress.    Average METs  -  - 2.32 2.32      Resistance Training   Training Prescription Yes Yes Yes Yes    Weight '3 3 3 ' lbs 4 lbs    Reps 10-12 10-15 10-12 10-12      Interval Training   Interval Training No No No No      Treadmill   MPH 1.4 1.5 1.5 1.5    Grade 0 0.5 0.5 0.5    Minutes '15 15 15 15    ' METs  -  - 2.25 2.25      NuStep   Level '3 4 3 3    ' Watts  -  - -  2.7 METs -  2.6 METs 74 spm    Minutes '15 15 15 15      ' T5 Nustep   Level  -  - 4  -    Minutes  -  - 15  -    METs  -  - 2  -      Biostep-RELP   Level 3 3  - 4    Watts  -  -  - -  42 spm    Minutes 15 15  - 15    METs  -  -  - 2      Home Exercise Plan   Plans to continue exercise at Home  walking  - Home  walking Home  walking     Frequency Add 2 additional days to program exercise sessions.  - Add 2 additional days to program exercise sessions. Add 2 additional days to program exercise sessions.       Exercise Comments:     Exercise Comments    Row Name 04/24/16 1212 05/22/16 1348 06/06/16 1417 06/21/16 1020 07/04/16 1309   Exercise Comments Ms Smick has been on a heart monitor and may have a cath per phone call to Laureen - she will return when cleared to exercise and exercise will be modified if needed. Ms Bandel  last visit was 04/19/16 Ms Fausto last visit was 04/19/16.  She has an appointment with cardiologist 8/1 and will get clearance befor ereturning to [place]. Ms Dobberstein has not attended since 04/19/16 Ms Hunkins has not attended [facility] since 04/19/16.   [place] Name 07/10/16 1444 07/17/16 1342 07/30/16 1552 07/31/16 1424 08/15/16 1048   Exercise Comments Genesi returned today without any problems. Airiana has done well her first 2 days [facility]. Zeola is progressing well with exercise. Reviewed home exercise with pt today.  Pt plans to walk at home for exercise.  Reviewed THR, pulse, RPE, sign and symptoms, and when to call 911 or MD.  Also discussed weather considerations and indoor options.  Pt voiced understanding. Emerly is progressing well with exercise.   [place] Name 08/28/16 1544 09/11/16 1611         Exercise Comments Seretha returned today after being out for almost two weeks.  She was able to return without any problems.  We will continue to monitor for progression. Jamesa continues to do well with exercise.  She only have 6 more visits!  We will continue to monitor her progression.         Discharge Exercise Prescription (Final Exercise Prescription Changes):     Exercise Prescription Changes - 09/11/16 1600      Exercise Review   Progression Yes     Response to Exercise   Blood Pressure (Admit) 138/70   Blood Pressure (Exercise) 150/78   Blood Pressure (Exit) 104/64   Heart Rate (Admit) 90 bpm   Heart  Rate (Exercise) 118 bpm   Heart Rate (Exit) 93 bpm   Oxygen Saturation (Admit) 97 %   Oxygen Saturation (Exercise) 92 %   Oxygen Saturation (Exit) 94 %   Rating of Perceived Exertion (Exercise) 13   Perceived Dyspnea (Exercise) 3   Symptoms none   Comments Home Exercise Guidelines given 07/31/16   Duration Progress to 45 minutes of aerobic exercise without signs/symptoms of physical distress   Intensity THRR unchanged     Progression   Progression Continue to progress workloads to maintain intensity without signs/symptoms of physical distress.   Average METs 2.32     Resistance Training   Training Prescription Yes   Weight 4 lbs   Reps 10-12     Interval Training   Interval Training No     Treadmill   MPH 1.5   Grade 0.5   Minutes 15   METs 2.25     NuStep   Level 3   Watts --  2.6 METs 74 spm   Minutes 15     Biostep-RELP   Level 4   Watts --  42 spm   Minutes 15   METs 2     Home Exercise Plan   Plans to continue exercise at Home  walking   Frequency Add 2 additional days to program exercise sessions.       Nutrition:  Target Goals: Understanding of nutrition guidelines, daily intake of sodium <157m, cholesterol <2094m calories 30% from fat and 7% or less from saturated fats, daily to have 5 or more servings of fruits and vegetables.  Biometrics:    Nutrition Therapy Plan and Nutrition Goals:   Nutrition Discharge: Rate Your Plate Scores:   Psychosocial: Target Goals: Acknowledge presence or absence of depression, maximize coping skills, provide positive support system. Participant is able to verbalize types and ability to use techniques and skills needed for reducing stress and depression.  Initial  Review & Psychosocial Screening:   Quality of Life Scores:     Quality of Life - 09/04/16 1552      Quality of Life Scores   Health/Function Pre 19.6 %   Health/Function Post 20.8 %   Health/Function % Change 6.12 %   Socioeconomic Pre 27  %   Socioeconomic Post 20.67 %   Socioeconomic % Change  -23.44 %   Psych/Spiritual Pre 22.29 %   Psych/Spiritual Post 21 %   Psych/Spiritual % Change -5.79 %   Family Pre 19.5 %   Family Post 21 %   Family % Change 7.69 %   GLOBAL Pre 21.56 %   GLOBAL Post 20.84 %   GLOBAL % Change -3.34 %      PHQ-9: Recent Review Flowsheet Data    Depression screen [facility] 2/9 09/04/2016 08/30/2016 01/09/2016   Decreased Interest '3 3 2   ' Down, Depressed, Hopeless '3 3 1   ' PHQ - 2 Score '6 6 3   ' Altered sleeping '3 3 2   ' Tired, decreased energy '3 3 2   ' Change in appetite '1 1 1   ' Feeling bad or failure about yourself  0 0 0   Trouble concentrating 0 0 0   Moving slowly or fidgety/restless 0 0 0   Suicidal thoughts 0 0 0   PHQ-9 Score '13 13 8   ' Difficult doing work/chores Not difficult at all Not difficult at all Not difficult at all      Psychosocial Evaluation and Intervention:     Psychosocial Evaluation - 04/08/16 1249      Psychosocial Evaluation & Interventions   Interventions Encouraged to exercise with the program and follow exercise prescription;Relaxation education;Stress management education   Comments Counselor met with Ms. Spadaccini today for initial psychosocial evalution.  She is a 75 yr old who was diagnosed with COPD in 2014.  Ms. Milian lives alone since the death of her spouse this past Oct 22, 2023 from lung cancer.  She became very sedentary during that time and her health issues increased as a result.  She has multiple health issues including vision & digestive  problems as well as anxiety .  She reports taking medications for all of these - including the anxiety and that they are currently being effective.  Ms. Selke states she has trouble getting to sleep, but sleeps at least 7-8 hours per night.  She states this program has already improved her sleep slightly and increased her ability to do normal activities.  Ms. Tucciarone is typically in a positive mood and continues to have  anxiety in new situations primarily.  She has an appointment with a neurologist in the next few weeks to assess why she fainted twice over the past several months.  Ms. Encarnacion has goals to be more fit and more active.  She plans to look into follow up programs to maintain her progress upon completion of this program.    Continued Psychosocial Services Needed Yes  Ms. Sedlack will benefit from the psychoeducational components of this program, especially anxiety and relaxation.        Psychosocial Re-Evaluation:     Psychosocial Re-Evaluation    Row Name 06/24/16 [phone] 07/17/16 1631 08/14/16 1325 08/14/16 1330       Psychosocial Re-Evaluation   Interventions  - Encouraged to attend Pulmonary Rehabilitation for the exercise;Relaxation education;Stress management education Encouraged to attend Pulmonary Rehabilitation for the exercise   encouraged Renezmae to talk to her PMD about her anxiety levels.  -  Comments Ms Jefcoat has had medical issues and has not attended since 04/19/2016. IN talking with her several times, she states she is looking forward to returning to [place] for it has been so encouraging for her both mentally and physically. Sanvika's psychosocial assessment reveals no barriers to participation in Pulmonary Rehab. Psychosocial areas that are currently affecting patient's rehab experience include concerns about treatment decisions, dealing with as evidenced by fear and worry about another syncopal episode occurring without warning.Stanton Kidney does continue to exhibit positive coping skills to deal with her psychosocial concerns. Offered emotional support and reassurance. Gweneth does feel she is making progress toward Pulmonary Rehab goals, even though she has been out for several months because of syncopal episodes and evaluation. Janett reports her health and activity level HAS NOT improved in the past 30 days, since she has not been in the program for that time period.Marland Kitchenas evidenced by patient's  report of unchanged ability to exercise. Patient reports feeling positive about current and projected progression in Pulmonary Rehab. After reviewing the patient's treatment plan, the patient is making progress toward Pulmonary Rehab goals. Patient's rate of progress toward rehab goals is good. Plan of action to help patient continue to work towards rehab goals include time with psychosocial counselor to review the fear of another episode and to look at coping mechanisms to help decrease fears. . Will continue to monitor and evaluate progress toward psychosocial goal(s). Patient psychosocial assessment reveals no barriers to participation in Pulmonary Rehab. Psychosocial areas that are currently affecting patient's rehab experience include concerns about treatment decisions, and emotional problems as evidenced by fear of another syncopal episode occurring. Patient does continue to exhibit positive coping skills to deal with her psychosocial concerns. Offered emotional support and reassurance. Patient does feel she is making progress toward Pulmonary Rehab goals. Patient reports her health and activity level has improved in the past 30 days as evidenced by patient's report of increasedability to complete her exercise prescription and improved ability to manage daily activities.  Patient reports  feeling positive about current and projected progression in Pulmonary Rehab. After reviewing the patient's treatment plan, the patient is making progress toward Pulmonary Rehab goals. Patient's rate of progress toward rehab goals is excellent. Plan of action to help patient continue to work towards rehab goals include decreasing fear/anxiety of another syncopal episode. Advised Ericia to talk to her primary MD about her "shoulders rising up to her ears" because of her anxiety. Will also have psychosocial staff talk to [facility] for any resource/techniques to help reduce anxiety. Will continue to monitor and evaluate progress toward  psychosocial goal(s). Theora did for first time since syncopal episodes take her grandchildren with her in her car.     Continued Psychosocial Services Needed  - Yes Yes  -      Education: Education Goals: Education classes will be provided on a weekly basis, covering required topics. Participant will state understanding/return demonstration of topics presented.  Learning Barriers/Preferences:   Education Topics: Initial Evaluation Education: - Verbal, written and demonstration of respiratory meds, RPE/PD scales, oximetry and breathing techniques. Instruction on use of nebulizers and MDIs: cleaning and proper use, rinsing mouth with steroid doses and importance of monitoring MDI activations. Flowsheet Row Pulmonary Rehab from 08/30/2016 in [facility] Cardiac and Pulmonary Rehab  Date  01/09/16  Educator  LB  Instruction Review Code  2- meets goals/outcomes      General Nutrition Guidelines/Fats and Fiber: -Group instruction provided by verbal, written material, models and posters to  present the general guidelines for heart healthy nutrition. Gives an explanation and review of dietary fats and fiber.   Controlling Sodium/Reading Food Labels: -Group verbal and written material supporting the discussion of sodium use in heart healthy nutrition. Review and explanation with models, verbal and written materials for utilization of the food label.   Exercise Physiology & Risk Factors: - Group verbal and written instruction with models to review the exercise physiology of the cardiovascular system and associated critical values. Details cardiovascular disease risk factors and the goals associated with each risk factor. Flowsheet Row Pulmonary Rehab from 08/30/2016 in [facility] Cardiac and Pulmonary Rehab  Date  07/10/16  Educator  AS  Instruction Review Code  2- meets goals/outcomes      Aerobic Exercise & Resistance Training: - Gives group verbal and written discussion on the health impact of  inactivity. On the components of aerobic and resistive training programs and the benefits of this training and how to safely progress through these programs. Flowsheet Row Pulmonary Rehab from 08/30/2016 in [facility] Cardiac and Pulmonary Rehab  Date  08/07/16  Educator  JH/AS  Instruction Review Code  2- meets goals/outcomes      Flexibility, Balance, General Exercise Guidelines: - Provides group verbal and written instruction on the benefits of flexibility and balance training programs. Provides general exercise guidelines with specific guidelines to those with heart or lung disease. Demonstration and skill practice provided. Flowsheet Row Pulmonary Rehab from 08/30/2016 in [facility] Cardiac and Pulmonary Rehab  Date  03/27/16  Educator  Salley Hews, PT`  Instruction Review Code  2- meets goals/outcomes      Stress Management: - Provides group verbal and written instruction about the health risks of elevated stress, cause of high stress, and healthy ways to reduce stress.   Depression: - Provides group verbal and written instruction on the correlation between heart/lung disease and depressed mood, treatment options, and the stigmas associated with seeking treatment.   Exercise & Equipment Safety: - Individual verbal instruction and demonstration of equipment use and safety with use of the equipment.   Infection Prevention: - Provides verbal and written material to individual with discussion of infection control including proper hand washing and proper equipment cleaning during exercise session.   Falls Prevention: - Provides verbal and written material to individual with discussion of falls prevention and safety. Flowsheet Row Pulmonary Rehab from 08/30/2016 in [facility] Cardiac and Pulmonary Rehab  Date  01/09/16  Educator  LB  Instruction Review Code  2- meets goals/outcomes      Diabetes: - Individual verbal and written instruction to review signs/symptoms of diabetes, desired  ranges of glucose level fasting, after meals and with exercise. Advice that pre and post exercise glucose checks will be done for 3 sessions at entry of program.   Chronic Lung Diseases: - Group verbal and written instruction to review new updates, new respiratory medications, new advancements in procedures and treatments. Provide informative websites and "800" numbers of self-education.   Lung Procedures: - Group verbal and written instruction to describe testing methods done to diagnose lung disease. Review the outcome of test results. Describe the treatment choices: Pulmonary Function Tests, ABGs and oximetry. Flowsheet Row Pulmonary Rehab from 08/30/2016 in [facility] Cardiac and Pulmonary Rehab  Date  04/12/16  Educator  [place]  Instruction Review Code  2- meets goals/outcomes      Energy Conservation: - Provide group verbal and written instruction for methods to conserve energy, plan and organize activities. Instruct on pacing techniques, use of  adaptive equipment and posture/positioning to relieve shortness of breath.   Triggers: - Group verbal and written instruction to review types of environmental controls: home humidity, furnaces, filters, dust mite/pet prevention, HEPA vacuums. To discuss weather changes, air quality and the benefits of nasal washing. Flowsheet Row Pulmonary Rehab from 08/30/2016 in [facility] Cardiac and Pulmonary Rehab  Date  07/29/16  Educator  LB  Instruction Review Code  2- meets goals/outcomes      Exacerbations: - Group verbal and written instruction to provide: warning signs, infection symptoms, calling MD promptly, preventive modes, and value of vaccinations. Review: effective airway clearance, coughing and/or vibration techniques. Create an Sports administrator.   Oxygen: - Individual and group verbal and written instruction on oxygen therapy. Includes supplement oxygen, available portable oxygen systems, continuous and intermittent flow rates, oxygen safety,  concentrators, and Medicare reimbursement for oxygen.   Respiratory Medications: - Group verbal and written instruction to review medications for lung disease. Drug class, frequency, complications, importance of spacers, rinsing mouth after steroid MDI's, and proper cleaning methods for nebulizers. Flowsheet Row Pulmonary Rehab from 08/30/2016 in [facility] Cardiac and Pulmonary Rehab  Date  01/09/16  Educator  LB  Instruction Review Code  2- meets goals/outcomes      AED/CPR: - Group verbal and written instruction with the use of models to demonstrate the basic use of the AED with the basic ABC's of resuscitation. Flowsheet Row Pulmonary Rehab from 08/30/2016 in [facility] Cardiac and Pulmonary Rehab  Date  04/05/16  Educator  CE  Instruction Review Code  2- meets goals/outcomes      Breathing Retraining: - Provides individuals verbal and written instruction on purpose, frequency, and proper technique of diaphragmatic breathing and pursed-lipped breathing. Applies individual practice skills. Flowsheet Row Pulmonary Rehab from 08/30/2016 in [facility] Cardiac and Pulmonary Rehab  Date  01/09/16  Educator  LB  Instruction Review Code  2- meets goals/outcomes      Anatomy and Physiology of the Lungs: - Group verbal and written instruction with the use of models to provide basic lung anatomy and physiology related to function, structure and complications of lung disease. Flowsheet Row Pulmonary Rehab from 08/30/2016 in [facility] Cardiac and Pulmonary Rehab  Date  07/12/16  Educator  [place]  Instruction Review Code  2- meets goals/outcomes      Heart Failure: - Group verbal and written instruction on the basics of heart failure: signs/symptoms, treatments, explanation of ejection fraction, enlarged heart and cardiomyopathy. Flowsheet Row Pulmonary Rehab from 08/30/2016 in [facility] Cardiac and Pulmonary Rehab  Date  07/19/16  Educator  [place]  Instruction Review Code  2- meets goals/outcomes      Sleep  Apnea: - Individual verbal and written instruction to review Obstructive Sleep Apnea. Review of risk factors, methods for diagnosing and types of masks and machines for OSA.   Anxiety: - Provides group, verbal and written instruction on the correlation between heart/lung disease and anxiety, treatment options, and management of anxiety.   Relaxation: - Provides group, verbal and written instruction about the benefits of relaxation for patients with heart/lung disease. Also provides patients with examples of relaxation techniques. Flowsheet Row Pulmonary Rehab from 08/30/2016 in [facility] Cardiac and Pulmonary Rehab  Date  04/17/16  Educator  Elissa Hefty  Instruction Review Code  2- Meets goals/outcomes      Knowledge Questionnaire Score:     Knowledge Questionnaire Score - 08/30/16 1336      Knowledge Questionnaire Score   Pre Score 10/10   Post Score 10/10  Core Components/Risk Factors/Patient Goals at Admission:   Core Components/Risk Factors/Patient Goals Review:      Goals and Risk Factor Review    Row Name 03/27/16 1457 04/08/16 1254 04/12/16 1242 04/12/16 1515 04/19/16 1217     Core Components/Risk Factors/Patient Goals Review   Personal Goals Review Sedentary;Develop more efficient breathing techniques such as purse lipped breathing and diaphragmatic breathing and practicing self-pacing with activity.;Improve shortness of breath with ADL's;Increase knowledge of respiratory medications and ability to use respiratory devices properly. Sedentary;Increase Strength and Stamina;Improve shortness of breath with ADL's  - Develop more efficient breathing techniques such as purse lipped breathing and diaphragmatic breathing and practicing self-pacing with activity. (P)  Increase knowledge of respiratory medications and ability to use respiratory devices properly.   Review Ms Swann is improving her stamina. She has not started back to the bike water exercising, but plans to return  to that after [facility]. She paces herself at home with activites tto minimalize her shortness of breath and uses PLB with her exercise goals. We discussed her MDI's and was given an aerochamber for her Proventil. I will check on the Breo  to see if appropriate tto use a spacer with this inhaler. [facility] informed staff today that yesterday she reached a milestone.  She was able to make her bed without stopping whereas before [facility] she would have to stop at least two times.  She stated she was really happy yesterday about this improvement/change.  Also she usually dumps cloths on the bed after getting out fo dryer.  Now she is able to fold them up too.  Not able to put away yet., but stated she is happy she is able to fold colthes up now.  She noticed that she is able to do more housework and able to breathe easier.  Cheyan states she is unable to walk at home due to balance issues.  She is scheduled to see a Neurologist in the near future about her balance.  Meria reports using 3 pound weights at home.  She states she enjoyes working out with weights and is feeling stronger.   Terrin blood pressure is stable . c/o heart rate 100 at home just walking. Heart rate upon arrival is 110 most days. Malkia says she drinks a lot of water every day so she doesn't feel she is dehydrated.  I suggested she talk to her MD since she has not had her thyroid level checked lately. Ashtynn reports she had a monitor on in Jan or Feb but she wasn't told by her MD that it showed anything. She drinks decaf coffee only in am but on albuteral. Letesha also chews Nicotine gum since even thought she quit smoking 3 years ago she reports "I wake up every morning craving a cigarette this long".  Reviewed PLB with Ms. Strubel.  She has good technique when performing PLB and knows when to use it.   -   Expected Outcomes  - Continue with [facility] prescription in order to achieve her goals; improve her stamina; increase her activities; improve her  energy/stamina; and decrease her SOB.   Continue using PLB with ADLS and Exercise.   Heart rate 80-90. Blood pressure cont to be stable.  This will decrease her WOB and SOB while exercising and performing ADLs.  -   Row Name 04/19/16 1243 07/12/16 1455 08/02/16 1254 08/07/16 1305 08/14/16 1327     Core Components/Risk Factors/Patient Goals Review   Personal Goals Review Increase knowledge of respiratory medications and  ability to use respiratory devices properly. Hypertension;Sedentary;Increase Strength and Stamina Sedentary;Increase Strength and Stamina Hypertension Increase knowledge of respiratory medications and ability to use respiratory devices properly.;Improve shortness of breath with ADL's;Develop more efficient breathing techniques such as purse lipped breathing and diaphragmatic breathing and practicing self-pacing with activity.   Review Reviewed medications with Ms. Spargo.  She takes Albuterol, Breo and Duoneb at home.  She has a spacer for ther MDI's.  She is taking her medications as prescribed and does not have any new questions or concerns with her medications. Lezette has been out for 3 months and states it is hard starting back.  Bps have been good in [facility] classes. Sanaia has not started walking at home. She is looking for her walker to be able to take with her for balance.  She is doing handweights and is planning to start a gait and balance program after graduating. Nickol's blood pressure was elevated slightly today at 130/70 but normal for her is 112/70. Brittinee is doing well with her medication compliance and MDI use. She demonstrates great PLB technique while exercising.  She has stated that she has had to use her neulizer more often this week. She wil talk to her Pulmonary MD tomorrow about her nebulizer use.   Expected Outcomes Taking her medications as prescribed will decrease her exacerabations and SOB. Riannon will continue to improve strength and stamina with regular participation in  [place]. Darcie will continue to come to exercise to improve stamina and start walking at home. Stable blood pressure. Continues compliance with her medications. Changes as physican orders for use of nebulizer, Continues use of PLB with her activities.   [place] Name 09/06/16 1225             Core Components/Risk Factors/Patient Goals Review   Personal Goals Review Sedentary;Increase Strength and Stamina;Weight Management/Obesity;Improve shortness of breath with ADL's;Develop more efficient breathing techniques such as purse lipped breathing and diaphragmatic breathing and practicing self-pacing with activity.;Increase knowledge of respiratory medications and ability to use respiratory devices properly.;Hypertension       Review Rasheda has been doing well in rehab.  She is walking one extra day a week at home.  She is checking her blood pressure some at home and it has been in the 120/70s.  She is taking her medication in the morning and has noticed that it is higher in the evenings.  She is doing better with being able to do her ADLs with less SOB.  She is using her pursed lip breathing more regularly.  She mentioned that it still varies from day to day with her nebulizer use as dome days she needs it three times and others she good after just one treatment.         Expected Outcomes Marai will continue to improve by coming the class and using PLB.          Core Components/Risk Factors/Patient Goals at Discharge (Final Review):      Goals and Risk Factor Review - 09/06/16 1225      Core Components/Risk Factors/Patient Goals Review   Personal Goals Review Sedentary;Increase Strength and Stamina;Weight Management/Obesity;Improve shortness of breath with ADL's;Develop more efficient breathing techniques such as purse lipped breathing and diaphragmatic breathing and practicing self-pacing with activity.;Increase knowledge of respiratory medications and ability to use respiratory devices  properly.;Hypertension   Review Rieley has been doing well in rehab.  She is walking one extra day a week at home.  She is checking her blood pressure  some at home and it has been in the 120/70s.  She is taking her medication in the morning and has noticed that it is higher in the evenings.  She is doing better with being able to do her ADLs with less SOB.  She is using her pursed lip breathing more regularly.  She mentioned that it still varies from day to day with her nebulizer use as dome days she needs it three times and others she good after just one treatment.     Expected Outcomes Charlii will continue to improve by coming the class and using PLB.      ITP Comments:     ITP Comments    Row Name 04/12/16 1237 05/22/16 1348 06/03/16 0745 06/06/16 1418 06/21/16 1021   ITP Comments Kae c/o heart rate 100 at home just walking. I suggested she talk to her MD since she has not had her thyroid level checked lately. Chayna reports she had a monitor on in Jan or Feb but she wasn't told by her MD that it showed anything. She drinks decaf coffee only in am but on albuteral. Mio also chews Nicotine gum since even thought she quit smoking 3 years ago she reports "I wake up every morning craving a cigarette this long".  Ms Duer last visit was 04/19/16 Ms Spelman called on 05/30/2016 to update her medical issues: she has just got off her 30 day heart monitor; she has an appointment with her cardiologist on 06/18/2016; and then she will know when she can return to [place]. Ms Coltrin last visit was 04/19/16.  She has an appointment with cardiologist 8/1 and will get clearance befor ereturning to [place]. Ms Eckmann has not attended since 04/19/16   Row Name 07/04/16 1309 08/15/16 1049         ITP Comments Ms Puerta has not attended [facility] since 04/19/16. Elica is progressing well with exercise.         Comments: 30 Day Review

## 2016-09-16 NOTE — Progress Notes (Signed)
Daily Session Note  Patient Details  Name: DARSHAY DEUPREE MRN: [id number] Date of Birth: January 13, 1941 Referring Provider:   Arn Medal Row Documentation from 04/29/2016 in [facility] Cardiac and Pulmonary Rehab  Referring Provider  Dr. Raul Del      Encounter Date: 09/16/2016  Check In:     Session Check In - 09/16/16 1333      Check-In   Location [facility]-Cardiac & Pulmonary Rehab   Staff Present Gerlene Burdock, RN, Moises Blood, BS, ACSM CEP, Exercise Physiologist;Laureen Owens Shark, BS, RRT, Respiratory Therapist   Supervising physician immediately available to respond to emergencies [facility] immediately available ER MD   Physician(s) Dr. Corky Downs and Dr. Jimmye Norman   Medication changes reported     No   Fall or balance concerns reported    No   Warm-up and Cool-down Performed as group-led instruction   Resistance Training Performed Yes   VAD Patient? No     VAD patient   Has back up controller? No         Goals Met:  Proper associated with RPD/PD & O2 Sat Exercise tolerated well  Goals Unmet:  Not Applicable  Comments:     Dr. Emily Filbert is Medical Director for [place] and [facility] Pulmonary Rehabilitation.

## 2016-09-16 NOTE — Progress Notes (Signed)
Ms Lawley met face to face with Dr Mark Miller, [facility] Medical Director. °

## 2016-09-18 ENCOUNTER — Encounter: Payer: Medicare Other | Attending: Specialist

## 2016-09-18 DIAGNOSIS — J449 Chronic obstructive pulmonary disease, unspecified: Secondary | ICD-10-CM | POA: Insufficient documentation

## 2016-09-18 DIAGNOSIS — M810 Age-related osteoporosis without current pathological fracture: Secondary | ICD-10-CM | POA: Insufficient documentation

## 2016-09-18 DIAGNOSIS — I10 Essential (primary) hypertension: Secondary | ICD-10-CM | POA: Insufficient documentation

## 2016-09-18 DIAGNOSIS — H409 Unspecified glaucoma: Secondary | ICD-10-CM | POA: Insufficient documentation

## 2016-09-20 ENCOUNTER — Encounter: Payer: Medicare Other | Admitting: *Deleted

## 2016-09-20 DIAGNOSIS — J449 Chronic obstructive pulmonary disease, unspecified: Secondary | ICD-10-CM | POA: Diagnosis not present

## 2016-09-20 DIAGNOSIS — I10 Essential (primary) hypertension: Secondary | ICD-10-CM | POA: Diagnosis not present

## 2016-09-20 DIAGNOSIS — M810 Age-related osteoporosis without current pathological fracture: Secondary | ICD-10-CM | POA: Diagnosis not present

## 2016-09-20 DIAGNOSIS — H409 Unspecified glaucoma: Secondary | ICD-10-CM | POA: Diagnosis not present

## 2016-09-20 NOTE — Progress Notes (Signed)
   09/20/16 1221  6 Minute Walk  Phase Discharge  Distance 1040 feet  Distance % Change -9.9 % (decreased 115 ft)  Walk Time 5.32 minutes  # of Rest Breaks 4 (9 sec, 9 sec, 13 sec, 10 sec)  MPH 2.22  METS 2.56  RPE 14  Perceived Dyspnea  4  VO2 Peak 8.97  Symptoms No  Resting HR 88 bpm  Resting BP 110/70  Max Ex. HR 121 bpm  Max Ex. BP 134/64  2 Minute Post BP 132/70  Interval HR  Interval Heart Rate? Yes  Baseline HR 88  1 Minute HR 108  2 Minute HR 109  3 Minute HR 111  4 Minute HR 110  5 Minute HR 114  6 Minute HR 121  2 Minute Post HR 109  Interval Oxygen  Interval Oxygen? Yes  Baseline Oxygen Saturation % 95 %  Baseline Liters of Oxygen 0 L (Room Air)  1 Minute Oxygen Saturation % 92 %  1 Minute Liters of Oxygen 0 L  2 Minute Oxygen Saturation % 86 % (stopped at 2:20 83%)  2 Minute Liters of Oxygen 0 L  3 Minute Oxygen Saturation % 90 % (stopped at  2:55 81%)  3 Minute Liters of Oxygen 0 L  4 Minute Oxygen Saturation % 91 %  4 Minute Liters of Oxygen 0 L  5 Minute Oxygen Saturation % 85 % (stopped at 4:59 leg fatigue (87%))  5 Minute Liters of Oxygen 0 L  6 Minute Oxygen Saturation % 88 % (stopped at 5:22 83%)  6 Minute Liters of Oxygen 0 L  2 Minute Post Oxygen Saturation % 97 %  2 Minute Post Liters of Oxygen 0 L  Emilyanne completed her post 6 min walk test today.  I had to stop her four times for oxygen desaturation below 86% on room air.  Kaede has been exercising on room while in Pulmonary Rehab, and always struggles during weight bearing activity.  She could benefit from home O2 for exercise to maintain her exercise plan after graduating. Walk test results have been sent to phsycians. Alberteen Sam, MA, ACSM RCEP 09/20/2016 12:32 PM

## 2016-09-20 NOTE — Progress Notes (Signed)
Daily Session Note  Patient Details  Name: Jessica Bowman MRN: [id number] Date of Birth: 1941/08/09 Referring Provider:   Arn Medal Row Documentation from 04/29/2016 in [facility] Cardiac and Pulmonary Rehab  Referring Provider  Dr. Raul Del      Encounter Date: 09/20/2016  Check In:     Session Check In - 09/20/16 1243      Check-In   Location [facility]-Cardiac & Pulmonary Rehab   Staff Present Alberteen Sam, MA, ACSM RCEP, Exercise Physiologist;Susanne Bice, RN, BSN, CCRP;Laureen Owens Shark, BS, RRT, Respiratory Therapist;Other   Supervising physician immediately available to respond to emergencies [facility] immediately available ER MD   Physician(s) Drs. McShane and Stafford   Medication changes reported     No   Fall or balance concerns reported    No   Warm-up and Cool-down Performed as group-led Location manager Performed Yes   VAD Patient? No     VAD patient   Has back up controller? No     Pain Assessment   Currently in Pain? No/denies   Multiple Pain Sites No         Goals Met:  Proper associated with RPD/PD & O2 Sat Independence with exercise equipment Using PLB without cueing & demonstrates good technique Exercise tolerated well Personal goals reviewed Strength training completed today  Goals Unmet:  Not Applicable  Comments: Pt able to follow exercise prescription today without complaint.  Will continue to monitor for progression.     [place] Name 09/20/16 1221         6 Minute Walk   Phase Discharge     Distance 1040 feet     Distance % Change -9.9 %  decreased 115 ft     Walk Time 5.32 minutes     # of Rest Breaks 4  9 sec, 9 sec, 13 sec, 10 sec     MPH 2.22     METS 2.56     RPE 14     Perceived Dyspnea  4     VO2 Peak 8.97     Symptoms No     Resting HR 88 bpm     Resting BP 110/70     Max Ex. HR 121 bpm     Max Ex. BP 134/64     2 Minute Post BP 132/70       Interval HR   Baseline HR 88     1 Minute HR 108      2 Minute HR 109     3 Minute HR 111     4 Minute HR 110     5 Minute HR 114     6 Minute HR 121     2 Minute Post HR 109     Interval Heart Rate? Yes       Interval Oxygen   Interval Oxygen? Yes     Baseline Oxygen Saturation % 95 %     Baseline Liters of Oxygen 0 L  Room Air     1 Minute Oxygen Saturation % 92 %     1 Minute Liters of Oxygen 0 L     2 Minute Oxygen Saturation % 86 %  stopped at 2:20 83%     2 Minute Liters of Oxygen 0 L     3 Minute Oxygen Saturation % 90 %  stopped at  2:55 81%     3 Minute Liters of Oxygen 0 L  4 Minute Oxygen Saturation % 91 %     4 Minute Liters of Oxygen 0 L     5 Minute Oxygen Saturation % 85 %  stopped at 4:59 leg fatigue (87%)     5 Minute Liters of Oxygen 0 L     6 Minute Oxygen Saturation % 88 %  stopped at 5:22 83%     6 Minute Liters of Oxygen 0 L     2 Minute Post Oxygen Saturation % 97 %     2 Minute Post Liters of Oxygen 0 L          Dr. Emily Filbert is Medical Director for [place] and [facility] Pulmonary Rehabilitation.

## 2016-10-02 ENCOUNTER — Encounter: Payer: Medicare Other | Admitting: *Deleted

## 2016-10-02 DIAGNOSIS — J449 Chronic obstructive pulmonary disease, unspecified: Secondary | ICD-10-CM | POA: Diagnosis not present

## 2016-10-02 NOTE — Progress Notes (Signed)
Daily Session Note  Patient Details  Name: Jessica Bowman MRN: [id number] Date of Birth: 01/21/1941 Referring Provider:   Flowsheet Row Documentation from 04/29/2016 in [facility] Cardiac and Pulmonary Rehab  Referring Provider  Dr. Fleming      Encounter Date: 10/02/2016  Check In:     Session Check In - 10/02/16 1234      Check-In   Location [facility]-Cardiac & Pulmonary Rehab   Staff Present Laureen Brown, BS, RRT, Respiratory Therapist;Jessica Hawkins, MA, ACSM RCEP, Exercise Physiologist;Amanda Sommer, BA, ACSM CEP, Exercise Physiologist;Other   Supervising physician immediately available to respond to emergencies [facility] immediately available ER MD   Physician(s) Drs. Veronese and Williams   Medication changes reported     No   Fall or balance concerns reported    No   Warm-up and Cool-down Performed as group-led instruction   Resistance Training Performed Yes   VAD Patient? No     VAD patient   Has back up controller? No     Pain Assessment   Currently in Pain? No/denies   Multiple Pain Sites No         Goals Met:  Proper associated with RPD/PD & O2 Sat Independence with exercise equipment Using PLB without cueing & demonstrates good technique Exercise tolerated well No report of cardiac concerns or symptoms Strength training completed today  Goals Unmet:  Not Applicable  Comments: Pt able to follow exercise prescription today without complaint.  Will continue to monitor for progression.    Dr. Mark Miller is Medical Director for [facility] and [facility]. 

## 2016-10-04 ENCOUNTER — Encounter: Payer: Medicare Other | Admitting: *Deleted

## 2016-10-04 DIAGNOSIS — J449 Chronic obstructive pulmonary disease, unspecified: Secondary | ICD-10-CM

## 2016-10-04 NOTE — Progress Notes (Signed)
Dessiree psychosocial assessment reveals no barriers at this time to participation in Pulmonary Rehab.  She has good family and friend support with her 2 daughters that encourage Jersi to participate in [place] and progress with her goals.  Tari's shortness of breath concerns are monitored, but she has acknowledged that attending the program has helped to maintain quality life with improved mobility, self-care, and emotional and financial stability.  Monae is commended for regular attendance and self-motivation to improve her pulmonary disease management. Alberteen Sam, MA, ACSM RCEP 10/04/2016 12:49 PM

## 2016-10-04 NOTE — Progress Notes (Signed)
Daily Session Note  Patient Details  Name: Jessica Bowman MRN: [id number] Date of Birth: 03-16-1941 Referring Provider:   Arn Medal Row Documentation from 04/29/2016 in [facility] Cardiac and Pulmonary Rehab  Referring Provider  Dr. Raul Del      Encounter Date: 10/04/2016  Check In:     Session Check In - 10/04/16 1225      Check-In   Location [facility]-Cardiac & Pulmonary Rehab   Staff Present Nyoka Cowden, RN, BSN, Willette Pa, MA, ACSM RCEP, Exercise Physiologist;Amanda Oletta Darter, BA, ACSM CEP, Exercise Physiologist;Other   Supervising physician immediately available to respond to emergencies [facility] immediately available ER MD   Physician(s) Drs. Jimmye Norman and Quale   Medication changes reported     No   Fall or balance concerns reported    No   Warm-up and Cool-down Performed as group-led Location manager Performed Yes   VAD Patient? No     Pain Assessment   Currently in Pain? No/denies   Multiple Pain Sites No         Goals Met:  Proper associated with RPD/PD & O2 Sat Independence with exercise equipment Using PLB without cueing & demonstrates good technique Exercise tolerated well Strength training completed today  Goals Unmet:  Not Applicable  Comments: Pt able to follow exercise prescription today without complaint.  Will continue to monitor for progression.    Dr. Emily Filbert is Medical Director for [place] and [facility] Pulmonary Rehabilitation.

## 2016-10-14 ENCOUNTER — Encounter: Payer: Self-pay | Admitting: *Deleted

## 2016-10-14 DIAGNOSIS — J449 Chronic obstructive pulmonary disease, unspecified: Secondary | ICD-10-CM

## 2016-10-14 NOTE — Progress Notes (Signed)
Pulmonary Individual Treatment Plan  Patient Details  Name: Jessica Bowman MRN: [id number] Date of Birth: 23-Sep-1941 Referring Provider:   Flowsheet Row Documentation from 04/29/2016 in [facility] Cardiac and Pulmonary Rehab  Referring Provider  Dr. Raul Del      Initial Encounter Date:  Flowsheet Row Documentation from 04/29/2016 in [facility] Cardiac and Pulmonary Rehab  Date  01/09/16  Referring Provider  Dr. Raul Del      Visit Diagnosis: Moderate COPD (chronic obstructive pulmonary disease) ([place])  Patient's Home Medications on Admission:  Current Outpatient Prescriptions:  .  albuterol (PROAIR HFA) 108 (90 Base) MCG/ACT inhaler, Inhale 2 puffs into the lungs See admin instructions. Inhale 2 puffs every 4 to 6 hours as needed for shortness of breath/wheezing., Disp: , Rfl:  .  aspirin EC 81 MG tablet, Take 81 mg by mouth at bedtime. , Disp: , Rfl:  .  Cholecalciferol (VITAMIN D3) 1000 units CAPS, Take 1,000 Units by mouth daily. , Disp: , Rfl:  .  cyclobenzaprine (FLEXERIL) 5 MG tablet, Take 5 mg by mouth daily as needed for muscle spasms. , Disp: , Rfl:  .  escitalopram (LEXAPRO) 10 MG tablet, Take 5-10 mg by mouth See admin instructions. Take 1/2 tablet  (5 mg) by mouth every morning, and take 1 tablet by mouth every night at bedtime., Disp: , Rfl:  .  fluticasone furoate-vilanterol (BREO ELLIPTA) 100-25 MCG/INH AEPB, Inhale 1 puff into the lungs daily. , Disp: , Rfl:  .  ipratropium-albuterol (DUONEB) 0.5-2.5 (3) MG/3ML SOLN, Take 3 mLs by nebulization 2 (two) times daily as needed. For shortness of breath/wheezing., Disp: , Rfl:  .  irbesartan-hydrochlorothiazide (AVALIDE) 150-12.5 MG tablet, Take 0.5 tablets by mouth daily. , Disp: , Rfl:  .  LUMIGAN 0.01 % SOLN, Place 1 drop into both eyes at bedtime., Disp: , Rfl:  .  Melatonin 5 MG TABS, Take 5 mg by mouth at bedtime as needed. For sleep., Disp: , Rfl:  .  Multiple Vitamins-Minerals (STRESSTABS ADVANCED) TABS, Take 1 tablet by mouth  daily., Disp: , Rfl:  .  Probiotic Product (PHILLIPS COLON HEALTH PO), Take 1 capsule by mouth daily. , Disp: , Rfl:   Past Medical History: Past Medical History:  Diagnosis Date  . COPD (chronic obstructive pulmonary disease) ([place])   . Glaucoma   . Hypertension   . Osteoporosis     Tobacco Use: History  Smoking Status  . Former Smoker  . Quit date: 04/20/2012  Smokeless Tobacco  . Not on file    Labs: Recent Review Flowsheet Data    There is no flowsheet data to display.       ADL UCSD:     Pulmonary Assessment Scores    Row Name 08/30/16 1337         ADL UCSD   ADL Phase Exit     SOB Score total 58     Rest 0     Walk 3     Stairs 4     Bath 1     Dress 1     Shop 1        Pulmonary Function Assessment:   Exercise Target Goals:    Exercise Program Goal: Individual exercise prescription set with THRR, safety & activity barriers. Participant demonstrates ability to understand and report RPE using BORG scale, to self-measure pulse accurately, and to acknowledge the importance of the exercise prescription.  Exercise Prescription Goal: Starting with aerobic activity 30 plus minutes a day, 3 days per  week for initial exercise prescription. Provide home exercise prescription and guidelines that participant acknowledges understanding prior to discharge.  Activity Barriers & Risk Stratification:   6 Minute Walk:     6 Minute Walk    Row Name 09/20/16 1221         6 Minute Walk   Phase Discharge     Distance 1040 feet     Distance % Change -9.9 %  decreased 115 ft     Walk Time 5.32 minutes     # of Rest Breaks 4  9 sec, 9 sec, 13 sec, 10 sec     MPH 2.22     METS 2.56     RPE 14     Perceived Dyspnea  4     VO2 Peak 8.97     Symptoms No     Resting HR 88 bpm     Resting BP 110/70     Max Ex. HR 121 bpm     Max Ex. BP 134/64     2 Minute Post BP 132/70       Interval HR   Baseline HR 88     1 Minute HR 108     2 Minute HR 109     3  Minute HR 111     4 Minute HR 110     5 Minute HR 114     6 Minute HR 121     2 Minute Post HR 109     Interval Heart Rate? Yes       Interval Oxygen   Interval Oxygen? Yes     Baseline Oxygen Saturation % 95 %     Baseline Liters of Oxygen 0 L  Room Air     1 Minute Oxygen Saturation % 92 %     1 Minute Liters of Oxygen 0 L     2 Minute Oxygen Saturation % 86 %  stopped at 2:20 83%     2 Minute Liters of Oxygen 0 L     3 Minute Oxygen Saturation % 90 %  stopped at  2:55 81%     3 Minute Liters of Oxygen 0 L     4 Minute Oxygen Saturation % 91 %     4 Minute Liters of Oxygen 0 L     5 Minute Oxygen Saturation % 85 %  stopped at 4:59 leg fatigue (87%)     5 Minute Liters of Oxygen 0 L     6 Minute Oxygen Saturation % 88 %  stopped at 5:22 83%     6 Minute Liters of Oxygen 0 L     2 Minute Post Oxygen Saturation % 97 %     2 Minute Post Liters of Oxygen 0 L        Initial Exercise Prescription:     Initial Exercise Prescription - 04/29/16 1400      Date of Initial Exercise RX and Referring Provider   Date 01/09/16   Referring Provider Dr. Raul Del      Perform Capillary Blood Glucose checks as needed.  Exercise Prescription Changes:     Exercise Prescription Changes    Row Name 04/19/16 1130 07/17/16 1300 07/30/16 1500 07/31/16 1400 08/15/16 1000     Exercise Review   Progression  - Yes Yes Yes Yes     Response to Exercise   Blood Pressure (Admit) 142/70  - 120/64  - 124/70   Blood Pressure (Exercise) 122/60 118/60  158/82  - 128/82   Blood Pressure (Exit) 120/60 120/70 100/60  - 118/66   Heart Rate (Admit) 115 bpm 83 bpm 109 bpm  - 104 bpm   Heart Rate (Exercise) 118 bpm 124 bpm 123 bpm  - 119 bpm   Heart Rate (Exit) 97 bpm 91 bpm 104 bpm  - 100 bpm   Oxygen Saturation (Admit) 97 % 94 % 96 %  - 94 %   Oxygen Saturation (Exercise) 95 % 94 % 91 %  - 88 %   Oxygen Saturation (Exit) 97 % 95 % 94 %  - 96 %   Rating of Perceived Exertion (Exercise) 13 14 13   -  12   Perceived Dyspnea (Exercise) 4 4 3   - 3   Symptoms mild headache  -  - none none   Comments  -  -  - Home Exercise Guidelines given 07/31/16  -   Intensity THRR New  119-137 THRR unchanged THRR unchanged THRR unchanged THRR unchanged     Progression   Progression Continue to progress workloads to maintain intensity without signs/symptoms of physical distress. Continue to progress workloads to maintain intensity without signs/symptoms of physical distress. Continue to progress workloads to maintain intensity without signs/symptoms of physical distress. Continue to progress workloads to maintain intensity without signs/symptoms of physical distress. Continue to progress workloads to maintain intensity without signs/symptoms of physical distress.     Resistance Training   Training Prescription Yes Yes Yes Yes Yes   Weight 3 3 3 3 3    Reps 10-12 10-12 10-12 10-12 10-15     Interval Training   Interval Training  - No No No No     Treadmill   MPH  - 1.3 1.4 1.4 1.5   Grade  - 0 0 0 0.5   Minutes  - 15 15 15 15      NuStep   Level  - 3 3 3 4    Minutes  - 15 15 15 15      Biostep-RELP   Level  - 3 3 3 3    Minutes  - 15 15 15 15    METs  - 2  -  -  -     Home Exercise Plan   Plans to continue exercise at  -  -  - Home  walking  -   Frequency  -  -  - Add 2 additional days to program exercise sessions.  -   Row Name 08/28/16 1500 09/11/16 1600 09/25/16 1600 10/08/16 1500       Exercise Review   Progression Yes Yes Yes Yes      Response to Exercise   Blood Pressure (Admit) 118/66 138/70 110/70 126/72    Blood Pressure (Exercise) 150/80 150/78 134/64 168/70    Blood Pressure (Exit) 118/60 104/64 126/70 116/60    Heart Rate (Admit) 89 bpm 90 bpm 85 bpm 78 bpm    Heart Rate (Exercise) 124 bpm 118 bpm 122 bpm 123 bpm    Heart Rate (Exit) 106 bpm 93 bpm 101 bpm 99 bpm    Oxygen Saturation (Admit) 97 % 97 % 95 % 95 %    Oxygen Saturation (Exercise) 96 % 92 % 91 % 88 %    Oxygen  Saturation (Exit) 96 % 94 % 96 % 95 %    Rating of Perceived Exertion (Exercise) 12 13 13 13     Perceived Dyspnea (Exercise) 3 3 3 3     Symptoms none none none none  Comments Home Exercise Guidelines given 07/31/16 Home Exercise Guidelines given 07/31/16 Home Exercise Guidelines given 07/31/16 Home Exercise Guidelines given 07/31/16    Duration Progress to 45 minutes of aerobic exercise without signs/symptoms of physical distress Progress to 45 minutes of aerobic exercise without signs/symptoms of physical distress Progress to 45 minutes of aerobic exercise without signs/symptoms of physical distress Progress to 45 minutes of aerobic exercise without signs/symptoms of physical distress    Intensity THRR unchanged THRR unchanged THRR unchanged THRR unchanged      Progression   Progression Continue to progress workloads to maintain intensity without signs/symptoms of physical distress. Continue to progress workloads to maintain intensity without signs/symptoms of physical distress. Continue to progress workloads to maintain intensity without signs/symptoms of physical distress. Continue to progress workloads to maintain intensity without signs/symptoms of physical distress.    Average METs 2.32 2.32 2.5 2.62      Resistance Training   Training Prescription Yes Yes Yes Yes    Weight 3 lbs 4 lbs 4 lbs 4 lbs    Reps 10-12 10-12 10-12 10-12      Interval Training   Interval Training No No No No      Treadmill   MPH 1.5 1.5 1.8 1.8    Grade 0.5 0.5 1.5 1.5    Minutes 15 15 15 15     METs 2.25 2.25 2.75 2.75      NuStep   Level 3 3 4 4     Watts -  2.7 METs -  2.6 METs 74 spm -  3.0 METs -  3.1 METs    Minutes 15 15 15 15       T5 Nustep   Level 4  -  -  -    Minutes 15  -  -  -    METs 2  -  -  -      Biostep-RELP   Level  - 4  - 4    Watts  - -  42 spm  -  -    Minutes  - 15  - 15    METs  - 2  - 2      Home Exercise Plan   Plans to continue exercise at Home  walking Home   walking Home  walking Home  walking    Frequency Add 2 additional days to program exercise sessions. Add 2 additional days to program exercise sessions. Add 2 additional days to program exercise sessions. Add 2 additional days to program exercise sessions.       Exercise Comments:     Exercise Comments    Row Name 04/24/16 1212 05/22/16 1348 06/06/16 1417 06/21/16 1020 07/04/16 1309   Exercise Comments Ms Purdy has been on a heart monitor and may have a cath per phone call to Laureen - she will return when cleared to exercise and exercise will be modified if needed. Ms Cana last visit was 04/19/16 Ms Croghan last visit was 04/19/16.  She has an appointment with cardiologist 8/1 and will get clearance befor ereturning to [place]. Ms Kubilus has not attended since 04/19/16 Ms Yothers has not attended [facility] since 04/19/16.   [place] Name 07/10/16 1444 07/17/16 1342 07/30/16 1552 07/31/16 1424 08/15/16 1048   Exercise Comments Ananiah returned today without any problems. Denea has done well her first 2 days [facility]. Haja is progressing well with exercise. Reviewed home exercise with pt today.  Pt plans to walk at home for exercise.  Reviewed THR, pulse, RPE, sign and symptoms, and when to call 911 or MD.  Also discussed weather considerations and indoor options.  Pt voiced understanding. Zan is progressing well with exercise.   [place] Name 08/28/16 1544 09/11/16 1611 09/25/16 1604 10/08/16 1545     Exercise Comments Stanton Kidney returned today after being out for almost two weeks.  She was able to return without any problems.  We will continue to monitor for progression. Terrilee continues to do well with exercise.  She only have 6 more visits!  We will continue to monitor her progression. Antoinnette has 4 visits to go!  She is going to try to increase her incline on her next visit.  We will continue to monitor her progresison. Cree has 2 more visits!  She is doing well with the extra incline.  We will continue to  monitor her progression.       Discharge Exercise Prescription (Final Exercise Prescription Changes):     Exercise Prescription Changes - 10/08/16 1500      Exercise Review   Progression Yes     Response to Exercise   Blood Pressure (Admit) 126/72   Blood Pressure (Exercise) 168/70   Blood Pressure (Exit) 116/60   Heart Rate (Admit) 78 bpm   Heart Rate (Exercise) 123 bpm   Heart Rate (Exit) 99 bpm   Oxygen Saturation (Admit) 95 %   Oxygen Saturation (Exercise) 88 %   Oxygen Saturation (Exit) 95 %   Rating of Perceived Exertion (Exercise) 13   Perceived Dyspnea (Exercise) 3   Symptoms none   Comments Home Exercise Guidelines given 07/31/16   Duration Progress to 45 minutes of aerobic exercise without signs/symptoms of physical distress   Intensity THRR unchanged     Progression   Progression Continue to progress workloads to maintain intensity without signs/symptoms of physical distress.   Average METs 2.62     Resistance Training   Training Prescription Yes   Weight 4 lbs   Reps 10-12     Interval Training   Interval Training No     Treadmill   MPH 1.8   Grade 1.5   Minutes 15   METs 2.75     NuStep   Level 4   Watts --  3.1 METs   Minutes 15     Biostep-RELP   Level 4   Minutes 15   METs 2     Home Exercise Plan   Plans to continue exercise at Home  walking   Frequency Add 2 additional days to program exercise sessions.       Nutrition:  Target Goals: Understanding of nutrition guidelines, daily intake of sodium 1500mg , cholesterol 200mg , calories 30% from fat and 7% or less from saturated fats, daily to have 5 or more servings of fruits and vegetables.  Biometrics:    Nutrition Therapy Plan and Nutrition Goals:   Nutrition Discharge: Rate Your Plate Scores:   Psychosocial: Target Goals: Acknowledge presence or absence of depression, maximize coping skills, provide positive support system. Participant is able to verbalize types and  ability to use techniques and skills needed for reducing stress and depression.  Initial Review & Psychosocial Screening:   Quality of Life Scores:     Quality of Life - 09/04/16 1552      Quality of Life Scores   Health/Function Pre 19.6 %   Health/Function Post 20.8 %   Health/Function % Change 6.12 %   Socioeconomic Pre 27 %   Socioeconomic Post 20.67 %  Socioeconomic % Change  -23.44 %   Psych/Spiritual Pre 22.29 %   Psych/Spiritual Post 21 %   Psych/Spiritual % Change -5.79 %   Family Pre 19.5 %   Family Post 21 %   Family % Change 7.69 %   GLOBAL Pre 21.56 %   GLOBAL Post 20.84 %   GLOBAL % Change -3.34 %      PHQ-9: Recent Review Flowsheet Data    Depression screen [facility] 2/9 09/04/2016 08/30/2016 01/09/2016   Decreased Interest 3 3 2    Down, Depressed, Hopeless 3 3 1    PHQ - 2 Score 6 6 3    Altered sleeping 3 3 2    Tired, decreased energy 3 3 2    Change in appetite 1 1 1    Feeling bad or failure about yourself  0 0 0   Trouble concentrating 0 0 0   Moving slowly or fidgety/restless 0 0 0   Suicidal thoughts 0 0 0   PHQ-9 Score 13 13 8    Difficult doing work/chores Not difficult at all Not difficult at all Not difficult at all      Psychosocial Evaluation and Intervention:   Psychosocial Re-Evaluation:     Psychosocial Re-Evaluation    Row Name 06/24/16 0854 07/17/16 1631 08/14/16 1325 08/14/16 1330 09/16/16 1532     Psychosocial Re-Evaluation   Interventions  - Encouraged to attend Pulmonary Rehabilitation for the exercise;Relaxation education;Stress management education Encouraged to attend Pulmonary Rehabilitation for the exercise   encouraged Harmonee to talk to her PMD about her anxiety levels.  -  -   Comments Ms Garrick has had medical issues and has not attended since 04/19/2016. IN talking with her several times, she states she is looking forward to returning to [place] for it has been so encouraging for her both mentally and physically. Jenniferann's  psychosocial assessment reveals no barriers to participation in Pulmonary Rehab. Psychosocial areas that are currently affecting patient's rehab experience include concerns about treatment decisions, dealing with as evidenced by fear and worry about another syncopal episode occurring without warning.Stanton Kidney does continue to exhibit positive coping skills to deal with her psychosocial concerns. Offered emotional support and reassurance. Cailyn does feel she is making progress toward Pulmonary Rehab goals, even though she has been out for several months because of syncopal episodes and evaluation. Sidny reports her health and activity level HAS NOT improved in the past 30 days, since she has not been in the program for that time period.Marland Kitchenas evidenced by patient's report of unchanged ability to exercise. Patient reports feeling positive about current and projected progression in Pulmonary Rehab. After reviewing the patient's treatment plan, the patient is making progress toward Pulmonary Rehab goals. Patient's rate of progress toward rehab goals is good. Plan of action to help patient continue to work towards rehab goals include time with psychosocial counselor to review the fear of another episode and to look at coping mechanisms to help decrease fears. . Will continue to monitor and evaluate progress toward psychosocial goal(s). Patient psychosocial assessment reveals no barriers to participation in Pulmonary Rehab. Psychosocial areas that are currently affecting patient's rehab experience include concerns about treatment decisions, and emotional problems as evidenced by fear of another syncopal episode occurring. Patient does continue to exhibit positive coping skills to deal with her psychosocial concerns. Offered emotional support and reassurance. Patient does feel she is making progress toward Pulmonary Rehab goals. Patient reports her health and activity level has improved in the past 30 days as evidenced by  patient's report of increasedability to complete her exercise prescription and improved ability to manage daily activities.  Patient reports  feeling positive about current and projected progression in Pulmonary Rehab. After reviewing the patient's treatment plan, the patient is making progress toward Pulmonary Rehab goals. Patient's rate of progress toward rehab goals is excellent. Plan of action to help patient continue to work towards rehab goals include decreasing fear/anxiety of another syncopal episode. Advised Shadia to talk to her primary MD about her "shoulders rising up to her ears" because of her anxiety. Will also have psychosocial staff talk to [facility] for any resource/techniques to help reduce anxiety. Will continue to monitor and evaluate progress toward psychosocial goal(s). Haviland did for first time since syncopal episodes take her grandchildren with her in her car.  Ms Samborski has been attending [facility] regularly and enjoys the exercise, plus the socialization. She has had some stressful episodes lately with her grandchildren and school bus pick-up, but has worked through it, since she knows how helpful her care of the kids is to their parents. After [facility], Ms Wollam plans to attend [employer] for the security of being monitored during exercise.    Continued Psychosocial Services Needed  - Yes Yes  -  -   Row Name 10/04/16 1249             Psychosocial Re-Evaluation   Interventions Encouraged to attend Pulmonary Rehabilitation for the exercise       Comments [facility] psychosocial assessment reveals no barriers at this time to participation in Pulmonary Rehab.  She has good family and friend support with her 2 daughters that encourage Carnelia to participate in [place] and progress with her goals.  Sayward's shortness of breath concerns are monitored, but she has acknowledged that attending the program has helped to maintain quality life with improved mobility, self-care, and emotional and  financial stability.  Cossette is commended for regular attendance and self-motivation to improve her pulmonary disease management.         Education: Education Goals: Education classes will be provided on a weekly basis, covering required topics. Participant will state understanding/return demonstration of topics presented.  Learning Barriers/Preferences:   Education Topics: Initial Evaluation Education: - Verbal, written and demonstration of respiratory meds, RPE/PD scales, oximetry and breathing techniques. Instruction on use of nebulizers and MDIs: cleaning and proper use, rinsing mouth with steroid doses and importance of monitoring MDI activations. Flowsheet Row Pulmonary Rehab from 10/02/2016 in [facility] Cardiac and Pulmonary Rehab  Date  01/09/16  Educator  LB  Instruction Review Code  2- meets goals/outcomes      General Nutrition Guidelines/Fats and Fiber: -Group instruction provided by verbal, written material, models and posters to present the general guidelines for heart healthy nutrition. Gives an explanation and review of dietary fats and fiber.   Controlling Sodium/Reading Food Labels: -Group verbal and written material supporting the discussion of sodium use in heart healthy nutrition. Review and explanation with models, verbal and written materials for utilization of the food label.   Exercise Physiology & Risk Factors: - Group verbal and written instruction with models to review the exercise physiology of the cardiovascular system and associated critical values. Details cardiovascular disease risk factors and the goals associated with each risk factor. Flowsheet Row Pulmonary Rehab from 10/02/2016 in [facility] Cardiac and Pulmonary Rehab  Date  10/02/16  Educator  AS  Instruction Review Code  2- meets goals/outcomes      Aerobic Exercise & Resistance Training: - Gives group verbal  and written discussion on the health impact of inactivity. On the components of aerobic and  resistive training programs and the benefits of this training and how to safely progress through these programs. Flowsheet Row Pulmonary Rehab from 10/02/2016 in [facility] Cardiac and Pulmonary Rehab  Date  08/07/16  Educator  JH/AS  Instruction Review Code  2- meets goals/outcomes      Flexibility, Balance, General Exercise Guidelines: - Provides group verbal and written instruction on the benefits of flexibility and balance training programs. Provides general exercise guidelines with specific guidelines to those with heart or lung disease. Demonstration and skill practice provided. Flowsheet Row Pulmonary Rehab from 10/02/2016 in [facility] Cardiac and Pulmonary Rehab  Date  03/27/16  Educator  Salley Hews, PT`  Instruction Review Code  2- meets goals/outcomes      Stress Management: - Provides group verbal and written instruction about the health risks of elevated stress, cause of high stress, and healthy ways to reduce stress.   Depression: - Provides group verbal and written instruction on the correlation between heart/lung disease and depressed mood, treatment options, and the stigmas associated with seeking treatment.   Exercise & Equipment Safety: - Individual verbal instruction and demonstration of equipment use and safety with use of the equipment.   Infection Prevention: - Provides verbal and written material to individual with discussion of infection control including proper hand washing and proper equipment cleaning during exercise session.   Falls Prevention: - Provides verbal and written material to individual with discussion of falls prevention and safety. Flowsheet Row Pulmonary Rehab from 10/02/2016 in [facility] Cardiac and Pulmonary Rehab  Date  01/09/16  Educator  LB  Instruction Review Code  2- meets goals/outcomes      Diabetes: - Individual verbal and written instruction to review signs/symptoms of diabetes, desired ranges of glucose level fasting, after meals and  with exercise. Advice that pre and post exercise glucose checks will be done for 3 sessions at entry of program.   Chronic Lung Diseases: - Group verbal and written instruction to review new updates, new respiratory medications, new advancements in procedures and treatments. Provide informative websites and "800" numbers of self-education.   Lung Procedures: - Group verbal and written instruction to describe testing methods done to diagnose lung disease. Review the outcome of test results. Describe the treatment choices: Pulmonary Function Tests, ABGs and oximetry. Flowsheet Row Pulmonary Rehab from 10/02/2016 in [facility] Cardiac and Pulmonary Rehab  Date  04/12/16  Educator  [place]  Instruction Review Code  2- meets goals/outcomes      Energy Conservation: - Provide group verbal and written instruction for methods to conserve energy, plan and organize activities. Instruct on pacing techniques, use of adaptive equipment and posture/positioning to relieve shortness of breath.   Triggers: - Group verbal and written instruction to review types of environmental controls: home humidity, furnaces, filters, dust mite/pet prevention, HEPA vacuums. To discuss weather changes, air quality and the benefits of nasal washing. Flowsheet Row Pulmonary Rehab from 10/02/2016 in [facility] Cardiac and Pulmonary Rehab  Date  07/29/16  Educator  LB  Instruction Review Code  2- meets goals/outcomes      Exacerbations: - Group verbal and written instruction to provide: warning signs, infection symptoms, calling MD promptly, preventive modes, and value of vaccinations. Review: effective airway clearance, coughing and/or vibration techniques. Create an Sports administrator.   Oxygen: - Individual and group verbal and written instruction on oxygen therapy. Includes supplement oxygen, available portable oxygen systems, continuous and intermittent flow  rates, oxygen safety, concentrators, and Medicare reimbursement for  oxygen.   Respiratory Medications: - Group verbal and written instruction to review medications for lung disease. Drug class, frequency, complications, importance of spacers, rinsing mouth after steroid MDI's, and proper cleaning methods for nebulizers. Flowsheet Row Pulmonary Rehab from 10/02/2016 in [facility] Cardiac and Pulmonary Rehab  Date  01/09/16  Educator  LB  Instruction Review Code  2- meets goals/outcomes      AED/CPR: - Group verbal and written instruction with the use of models to demonstrate the basic use of the AED with the basic ABC's of resuscitation. Flowsheet Row Pulmonary Rehab from 10/02/2016 in [facility] Cardiac and Pulmonary Rehab  Date  04/05/16  Educator  CE  Instruction Review Code  2- meets goals/outcomes      Breathing Retraining: - Provides individuals verbal and written instruction on purpose, frequency, and proper technique of diaphragmatic breathing and pursed-lipped breathing. Applies individual practice skills. Flowsheet Row Pulmonary Rehab from 10/02/2016 in [facility] Cardiac and Pulmonary Rehab  Date  01/09/16  Educator  LB  Instruction Review Code  2- meets goals/outcomes      Anatomy and Physiology of the Lungs: - Group verbal and written instruction with the use of models to provide basic lung anatomy and physiology related to function, structure and complications of lung disease. Flowsheet Row Pulmonary Rehab from 10/02/2016 in [facility] Cardiac and Pulmonary Rehab  Date  07/12/16  Educator  [place]  Instruction Review Code  2- meets goals/outcomes      Heart Failure: - Group verbal and written instruction on the basics of heart failure: signs/symptoms, treatments, explanation of ejection fraction, enlarged heart and cardiomyopathy. Flowsheet Row Pulmonary Rehab from 10/02/2016 in [facility] Cardiac and Pulmonary Rehab  Date  07/19/16  Educator  [place]  Instruction Review Code  2- meets goals/outcomes      Sleep Apnea: - Individual verbal and written instruction  to review Obstructive Sleep Apnea. Review of risk factors, methods for diagnosing and types of masks and machines for OSA.   Anxiety: - Provides group, verbal and written instruction on the correlation between heart/lung disease and anxiety, treatment options, and management of anxiety.   Relaxation: - Provides group, verbal and written instruction about the benefits of relaxation for patients with heart/lung disease. Also provides patients with examples of relaxation techniques. Flowsheet Row Pulmonary Rehab from 10/02/2016 in [facility] Cardiac and Pulmonary Rehab  Date  04/17/16  Educator  Elissa Hefty  Instruction Review Code  2- Meets goals/outcomes      Knowledge Questionnaire Score:     Knowledge Questionnaire Score - 08/30/16 1336      Knowledge Questionnaire Score   Pre Score 10/10   Post Score 10/10       Core Components/Risk Factors/Patient Goals at Admission:   Core Components/Risk Factors/Patient Goals Review:      Goals and Risk Factor Review    Row Name 04/19/16 1217 04/19/16 1243 07/12/16 1455 08/02/16 1254 08/07/16 1305     Core Components/Risk Factors/Patient Goals Review   Personal Goals Review (P)  Increase knowledge of respiratory medications and ability to use respiratory devices properly. Increase knowledge of respiratory medications and ability to use respiratory devices properly. Hypertension;Sedentary;Increase Strength and Stamina Sedentary;Increase Strength and Stamina Hypertension   Review  - Reviewed medications with Ms. Meidinger.  She takes Albuterol, Breo and Duoneb at home.  She has a spacer for ther MDI's.  She is taking her medications as prescribed and does not have any new questions or concerns  with her medications. Tekesha has been out for 3 months and states it is hard starting back.  Bps have been good in [facility] classes. Kailana has not started walking at home. She is looking for her walker to be able to take with her for balance.  She is doing  handweights and is planning to start a gait and balance program after graduating. Phallon's blood pressure was elevated slightly today at 130/70 but normal for her is 112/70.   Expected Outcomes  - Taking her medications as prescribed will decrease her exacerabations and SOB. [place] will continue to improve strength and stamina with regular participation in [place]. Lianette will continue to come to exercise to improve stamina and start walking at home. Stable blood pressure.   [place] Name 08/14/16 1327 09/06/16 1225 10/04/16 1236         Core Components/Risk Factors/Patient Goals Review   Personal Goals Review Increase knowledge of respiratory medications and ability to use respiratory devices properly.;Improve shortness of breath with ADL's;Develop more efficient breathing techniques such as purse lipped breathing and diaphragmatic breathing and practicing self-pacing with activity. Sedentary;Increase Strength and Stamina;Weight Management/Obesity;Improve shortness of breath with ADL's;Develop more efficient breathing techniques such as purse lipped breathing and diaphragmatic breathing and practicing self-pacing with activity.;Increase knowledge of respiratory medications and ability to use respiratory devices properly.;Hypertension Sedentary;Increase Strength and Stamina;Weight Management/Obesity;Improve shortness of breath with ADL's;Develop more efficient breathing techniques such as purse lipped breathing and diaphragmatic breathing and practicing self-pacing with activity.;Increase knowledge of respiratory medications and ability to use respiratory devices properly.;Hypertension     Review Jadalynn is doing well with her medication compliance and MDI use. She demonstrates great PLB technique while exercising.  She has stated that she has had to use her neulizer more often this week. She wil talk to her Pulmonary MD tomorrow about her nebulizer use. Isavel has been doing well in rehab.  She is walking one extra day a  week at home.  She is checking her blood pressure some at home and it has been in the 120/70s.  She is taking her medication in the morning and has noticed that it is higher in the evenings.  She is doing better with being able to do her ADLs with less SOB.  She is using her pursed lip breathing more regularly.  She mentioned that it still varies from day to day with her nebulizer use as dome days she needs it three times and others she good after just one treatment.   Mckinna states she is feeling better overall, with improved strength and stamina.  She has gained 5lbs (116lbs up to 121lbs) since starting the program.  Her shortness of breath is about the same, having good and bad days.  However, using pursed lip breathing helps on the bad days.  She denies any problems with her medications.  Her resting blood pressure is good, but does increase with exercise.  She notes she has been able to increase her the intensity on machines in class.       Expected Outcomes Continues compliance with her medications. Changes as physican orders for use of nebulizer, Continues use of PLB with her activities. Vanity will continue to improve by coming the class and using PLB. Luretha is set to graduate next week and plans to go to [employer].          Core Components/Risk Factors/Patient Goals at Discharge (Final Review):      Goals and Risk Factor Review - 10/04/16 1236  Core Components/Risk Factors/Patient Goals Review   Personal Goals Review Sedentary;Increase Strength and Stamina;Weight Management/Obesity;Improve shortness of breath with ADL's;Develop more efficient breathing techniques such as purse lipped breathing and diaphragmatic breathing and practicing self-pacing with activity.;Increase knowledge of respiratory medications and ability to use respiratory devices properly.;Hypertension   Review Kalaina states she is feeling better overall, with improved strength and stamina.  She has gained 5lbs (116lbs up to  121lbs) since starting the program.  Her shortness of breath is about the same, having good and bad days.  However, using pursed lip breathing helps on the bad days.  She denies any problems with her medications.  Her resting blood pressure is good, but does increase with exercise.  She notes she has been able to increase her the intensity on machines in class.     Expected Outcomes Kendyle is set to graduate next week and plans to go to [employer].        ITP Comments:     ITP Comments    Row Name 05/22/16 1348 06/03/16 0745 06/06/16 1418 06/21/16 1021 07/04/16 1309   ITP Comments Ms Bagnall last visit was 04/19/16 Ms Dillahunt called on 05/30/2016 to update her medical issues: she has just got off her 30 day heart monitor; she has an appointment with her cardiologist on 06/18/2016; and then she will know when she can return to [place]. Ms Herington last visit was 04/19/16.  She has an appointment with cardiologist 8/1 and will get clearance befor ereturning to [place]. Ms Klink has not attended since 04/19/16 Ms Mcmunn has not attended [facility] since 04/19/16.   [place] Name 08/15/16 1049           ITP Comments Alanny is progressing well with exercise.          Comments: 30 Day Review

## 2016-10-22 ENCOUNTER — Telehealth: Payer: Self-pay | Admitting: Respiratory Therapy

## 2016-10-22 ENCOUNTER — Encounter: Payer: Self-pay | Admitting: Respiratory Therapy

## 2016-10-22 DIAGNOSIS — J449 Chronic obstructive pulmonary disease, unspecified: Secondary | ICD-10-CM

## 2016-11-05 ENCOUNTER — Encounter: Payer: Self-pay | Admitting: *Deleted

## 2016-11-05 ENCOUNTER — Telehealth: Payer: Self-pay | Admitting: *Deleted

## 2016-11-05 DIAGNOSIS — J449 Chronic obstructive pulmonary disease, unspecified: Secondary | ICD-10-CM

## 2016-11-05 NOTE — Telephone Encounter (Signed)
Called to check on status of return.  She has not been coming.  She hopes to return in January.  She still wants to finish her last two visits.

## 2016-11-05 NOTE — Progress Notes (Signed)
Pulmonary Individual Treatment Plan  Patient Details  Name: Jessica Bowman MRN: [id number] Date of Birth: May 19, 1941 Referring Provider:   Arn Medal Row Documentation from 04/29/2016 in [facility] Cardiac and Pulmonary Rehab  Referring Provider  Dr. Raul Del      Initial Encounter Date:  Flowsheet Row Documentation from 04/29/2016 in [facility] Cardiac and Pulmonary Rehab  Date  01/09/16  Referring Provider  Dr. Raul Del      Visit Diagnosis: COPD, moderate ([place])  Patient's Home Medications on Admission:  Current Outpatient Prescriptions:  .  albuterol (PROAIR HFA) 108 (90 Base) MCG/ACT inhaler, Inhale 2 puffs into the lungs See admin instructions. Inhale 2 puffs every 4 to 6 hours as needed for shortness of breath/wheezing., Disp: , Rfl:  .  aspirin EC 81 MG tablet, Take 81 mg by mouth at bedtime. , Disp: , Rfl:  .  Cholecalciferol (VITAMIN D3) 1000 units CAPS, Take 1,000 Units by mouth daily. , Disp: , Rfl:  .  cyclobenzaprine (FLEXERIL) 5 MG tablet, Take 5 mg by mouth daily as needed for muscle spasms. , Disp: , Rfl:  .  escitalopram (LEXAPRO) 10 MG tablet, Take 5-10 mg by mouth See admin instructions. Take 1/2 tablet  (5 mg) by mouth every morning, and take 1 tablet by mouth every night at bedtime., Disp: , Rfl:  .  fluticasone furoate-vilanterol (BREO ELLIPTA) 100-25 MCG/INH AEPB, Inhale 1 puff into the lungs daily. , Disp: , Rfl:  .  ipratropium-albuterol (DUONEB) 0.5-2.5 (3) MG/3ML SOLN, Take 3 mLs by nebulization 2 (two) times daily as needed. For shortness of breath/wheezing., Disp: , Rfl:  .  irbesartan-hydrochlorothiazide (AVALIDE) 150-12.5 MG tablet, Take 0.5 tablets by mouth daily. , Disp: , Rfl:  .  LUMIGAN 0.01 % SOLN, Place 1 drop into both eyes at bedtime., Disp: , Rfl:  .  Melatonin 5 MG TABS, Take 5 mg by mouth at bedtime as needed. For sleep., Disp: , Rfl:  .  Multiple Vitamins-Minerals (STRESSTABS ADVANCED) TABS, Take 1 tablet by mouth daily., Disp: , Rfl:  .  Probiotic Product  (PHILLIPS COLON HEALTH PO), Take 1 capsule by mouth daily. , Disp: , Rfl:   Past Medical History: Past Medical History:  Diagnosis Date  . COPD (chronic obstructive pulmonary disease) ([place])   . Glaucoma   . Hypertension   . Osteoporosis     Tobacco Use: History  Smoking Status  . Former Smoker  . Quit date: 04/20/2012  Smokeless Tobacco  . Not on file    Labs: Recent Review Flowsheet Data    There is no flowsheet data to display.       ADL UCSD:     Pulmonary Assessment Scores    Row Name 08/30/16 1337         ADL UCSD   ADL Phase Exit     SOB Score total 58     Rest 0     Walk 3     Stairs 4     Bath 1     Dress 1     Shop 1        Pulmonary Function Assessment:   Exercise Target Goals:    Exercise Program Goal: Individual exercise prescription set with THRR, safety & activity barriers. Participant demonstrates ability to understand and report RPE using BORG scale, to self-measure pulse accurately, and to acknowledge the importance of the exercise prescription.  Exercise Prescription Goal: Starting with aerobic activity 30 plus minutes a day, 3 days per week for initial exercise  prescription. Provide home exercise prescription and guidelines that participant acknowledges understanding prior to discharge.  Activity Barriers & Risk Stratification:   6 Minute Walk:     6 Minute Walk    Row Name 09/20/16 1221         6 Minute Walk   Phase Discharge     Distance 1040 feet     Distance % Change -9.9 %  decreased 115 ft     Walk Time 5.32 minutes     # of Rest Breaks 4  9 sec, 9 sec, 13 sec, 10 sec     MPH 2.22     METS 2.56     RPE 14     Perceived Dyspnea  4     VO2 Peak 8.97     Symptoms No     Resting HR 88 bpm     Resting BP 110/70     Max Ex. HR 121 bpm     Max Ex. BP 134/64     2 Minute Post BP 132/70       Interval HR   Baseline HR 88     1 Minute HR 108     2 Minute HR 109     3 Minute HR 111     4 Minute HR 110     5  Minute HR 114     6 Minute HR 121     2 Minute Post HR 109     Interval Heart Rate? Yes       Interval Oxygen   Interval Oxygen? Yes     Baseline Oxygen Saturation % 95 %     Baseline Liters of Oxygen 0 L  Room Air     1 Minute Oxygen Saturation % 92 %     1 Minute Liters of Oxygen 0 L     2 Minute Oxygen Saturation % 86 %  stopped at 2:20 83%     2 Minute Liters of Oxygen 0 L     3 Minute Oxygen Saturation % 90 %  stopped at  2:55 81%     3 Minute Liters of Oxygen 0 L     4 Minute Oxygen Saturation % 91 %     4 Minute Liters of Oxygen 0 L     5 Minute Oxygen Saturation % 85 %  stopped at 4:59 leg fatigue (87%)     5 Minute Liters of Oxygen 0 L     6 Minute Oxygen Saturation % 88 %  stopped at 5:22 83%     6 Minute Liters of Oxygen 0 L     2 Minute Post Oxygen Saturation % 97 %     2 Minute Post Liters of Oxygen 0 L        Initial Exercise Prescription:   Perform Capillary Blood Glucose checks as needed.  Exercise Prescription Changes:     Exercise Prescription Changes    Row Name 07/17/16 1300 07/30/16 1500 07/31/16 1400 08/15/16 1000 08/28/16 1500     Exercise Review   Progression Yes Yes Yes Yes Yes     Response to Exercise   Blood Pressure (Admit)  - 120/64  - 124/70 118/66   Blood Pressure (Exercise) 118/60 158/82  - 128/82 150/80   Blood Pressure (Exit) 120/70 100/60  - 118/66 118/60   Heart Rate (Admit) 83 bpm 109 bpm  - 104 bpm 89 bpm   Heart Rate (Exercise) 124 bpm 123 bpm  -  119 bpm 124 bpm   Heart Rate (Exit) 91 bpm 104 bpm  - 100 bpm 106 bpm   Oxygen Saturation (Admit) 94 % 96 %  - 94 % 97 %   Oxygen Saturation (Exercise) 94 % 91 %  - 88 % 96 %   Oxygen Saturation (Exit) 95 % 94 %  - 96 % 96 %   Rating of Perceived Exertion (Exercise) 14 13  - 12 12   Perceived Dyspnea (Exercise) 4 3  - 3 3   Symptoms  -  - none none none   Comments  -  - Home Exercise Guidelines given 07/31/16  - Home Exercise Guidelines given 07/31/16   Duration  -  -  -  -  Progress to 45 minutes of aerobic exercise without signs/symptoms of physical distress   Intensity THRR unchanged THRR unchanged THRR unchanged THRR unchanged THRR unchanged     Progression   Progression Continue to progress workloads to maintain intensity without signs/symptoms of physical distress. Continue to progress workloads to maintain intensity without signs/symptoms of physical distress. Continue to progress workloads to maintain intensity without signs/symptoms of physical distress. Continue to progress workloads to maintain intensity without signs/symptoms of physical distress. Continue to progress workloads to maintain intensity without signs/symptoms of physical distress.   Average METs  -  -  -  - 2.32     Resistance Training   Training Prescription Yes Yes Yes Yes Yes   Weight 3 3 3 3 3  lbs   Reps 10-12 10-12 10-12 10-15 10-12     Interval Training   Interval Training No No No No No     Treadmill   MPH 1.3 1.4 1.4 1.5 1.5   Grade 0 0 0 0.5 0.5   Minutes 15 15 15 15 15    METs  -  -  -  - 2.25     NuStep   Level 3 3 3 4 3    Watts  -  -  -  - -  2.7 METs   Minutes 15 15 15 15 15      T5 Nustep   Level  -  -  -  - 4   Minutes  -  -  -  - 15   METs  -  -  -  - 2     Biostep-RELP   Level 3 3 3 3   -   Minutes 15 15 15 15   -   METs 2  -  -  -  -     Home Exercise Plan   Plans to continue exercise at  -  - Home  walking  - Home  walking   Frequency  -  - Add 2 additional days to program exercise sessions.  - Add 2 additional days to program exercise sessions.   Row Name 09/11/16 1600 09/25/16 1600 10/08/16 1500         Exercise Review   Progression Yes Yes Yes       Response to Exercise   Blood Pressure (Admit) 138/70 110/70 126/72     Blood Pressure (Exercise) 150/78 134/64 168/70     Blood Pressure (Exit) 104/64 126/70 116/60     Heart Rate (Admit) 90 bpm 85 bpm 78 bpm     Heart Rate (Exercise) 118 bpm 122 bpm 123 bpm     Heart Rate (Exit) 93 bpm 101 bpm  99 bpm     Oxygen Saturation (Admit) 97 % 95 %  95 %     Oxygen Saturation (Exercise) 92 % 91 % 88 %     Oxygen Saturation (Exit) 94 % 96 % 95 %     Rating of Perceived Exertion (Exercise) 13 13 13      Perceived Dyspnea (Exercise) 3 3 3      Symptoms none none none     Comments Home Exercise Guidelines given 07/31/16 Home Exercise Guidelines given 07/31/16 Home Exercise Guidelines given 07/31/16     Duration Progress to 45 minutes of aerobic exercise without signs/symptoms of physical distress Progress to 45 minutes of aerobic exercise without signs/symptoms of physical distress Progress to 45 minutes of aerobic exercise without signs/symptoms of physical distress     Intensity THRR unchanged THRR unchanged THRR unchanged       Progression   Progression Continue to progress workloads to maintain intensity without signs/symptoms of physical distress. Continue to progress workloads to maintain intensity without signs/symptoms of physical distress. Continue to progress workloads to maintain intensity without signs/symptoms of physical distress.     Average METs 2.32 2.5 2.62       Resistance Training   Training Prescription Yes Yes Yes     Weight 4 lbs 4 lbs 4 lbs     Reps 10-12 10-12 10-12       Interval Training   Interval Training No No No       Treadmill   MPH 1.5 1.8 1.8     Grade 0.5 1.5 1.5     Minutes 15 15 15      METs 2.25 2.75 2.75       NuStep   Level 3 4 4      Watts -  2.6 METs 74 spm -  3.0 METs -  3.1 METs     Minutes 15 15 15        Biostep-RELP   Level 4  - 4     Watts -  42 spm  -  -     Minutes 15  - 15     METs 2  - 2       Home Exercise Plan   Plans to continue exercise at Home  walking Home  walking Home  walking     Frequency Add 2 additional days to program exercise sessions. Add 2 additional days to program exercise sessions. Add 2 additional days to program exercise sessions.        Exercise Comments:     Exercise Comments    Row Name 05/22/16  1348 06/06/16 1417 06/21/16 1020 07/04/16 1309 07/10/16 1444   Exercise Comments Ms Johnsons last visit was 04/19/16 Ms Desorbo last visit was 04/19/16.  She has an appointment with cardiologist 8/1 and will get clearance befor ereturning to [place]. Ms Konicek has not attended since 04/19/16 Ms Poelman has not attended [facility] since 04/19/16. Mayerli returned today without any problems.   [place] Name 07/17/16 1342 07/30/16 1552 07/31/16 1424 08/15/16 1048 08/28/16 1544   Exercise Comments Alexandra has done well her first 2 [facility]. Pieper is progressing well with exercise. Reviewed home exercise with pt today.  Pt plans to walk at home for exercise.  Reviewed THR, pulse, RPE, sign and symptoms, and when to call 911 or MD.  Also discussed weather considerations and indoor options.  Pt voiced understanding. Jesus is progressing well with exercise. Torina returned today after being out for almost two weeks.  She was able to return without any problems.  We will  continue to monitor for progression.   [place] Name 09/11/16 1611 09/25/16 1604 10/08/16 1545       Exercise Comments Tera continues to do well with exercise.  She only have 6 more visits!  We will continue to monitor her progression. Eve has 4 visits to go!  She is going to try to increase her incline on her next visit.  We will continue to monitor her progresison. Zaylani has 2 more visits!  She is doing well with the extra incline.  We will continue to monitor her progression.        Discharge Exercise Prescription (Final Exercise Prescription Changes):     Exercise Prescription Changes - 10/08/16 1500      Exercise Review   Progression Yes     Response to Exercise   Blood Pressure (Admit) 126/72   Blood Pressure (Exercise) 168/70   Blood Pressure (Exit) 116/60   Heart Rate (Admit) 78 bpm   Heart Rate (Exercise) 123 bpm   Heart Rate (Exit) 99 bpm   Oxygen Saturation (Admit) 95 %   Oxygen Saturation (Exercise) 88 %   Oxygen Saturation (Exit) 95 %    Rating of Perceived Exertion (Exercise) 13   Perceived Dyspnea (Exercise) 3   Symptoms none   Comments Home Exercise Guidelines given 07/31/16   Duration Progress to 45 minutes of aerobic exercise without signs/symptoms of physical distress   Intensity THRR unchanged     Progression   Progression Continue to progress workloads to maintain intensity without signs/symptoms of physical distress.   Average METs 2.62     Resistance Training   Training Prescription Yes   Weight 4 lbs   Reps 10-12     Interval Training   Interval Training No     Treadmill   MPH 1.8   Grade 1.5   Minutes 15   METs 2.75     NuStep   Level 4   Watts --  3.1 METs   Minutes 15     Biostep-RELP   Level 4   Minutes 15   METs 2     Home Exercise Plan   Plans to continue exercise at Home  walking   Frequency Add 2 additional days to program exercise sessions.       Nutrition:  Target Goals: Understanding of nutrition guidelines, daily intake of sodium 1500mg , cholesterol 200mg , calories 30% from fat and 7% or less from saturated fats, daily to have 5 or more servings of fruits and vegetables.  Biometrics:    Nutrition Therapy Plan and Nutrition Goals:   Nutrition Discharge: Rate Your Plate Scores:   Psychosocial: Target Goals: Acknowledge presence or absence of depression, maximize coping skills, provide positive support system. Participant is able to verbalize types and ability to use techniques and skills needed for reducing stress and depression.  Initial Review & Psychosocial Screening:   Quality of Life Scores:     Quality of Life - 09/04/16 1552      Quality of Life Scores   Health/Function Pre 19.6 %   Health/Function Post 20.8 %   Health/Function % Change 6.12 %   Socioeconomic Pre 27 %   Socioeconomic Post 20.67 %   Socioeconomic % Change  -23.44 %   Psych/Spiritual Pre 22.29 %   Psych/Spiritual Post 21 %   Psych/Spiritual % Change -5.79 %   Family Pre 19.5 %    Family Post 21 %   Family % Change 7.69 %   GLOBAL Pre 21.56 %  GLOBAL Post 20.84 %   GLOBAL % Change -3.34 %      PHQ-9: Recent Review Flowsheet Data    Depression screen [facility] 2/9 09/04/2016 08/30/2016 01/09/2016   Decreased Interest 3 3 2    Down, Depressed, Hopeless 3 3 1    PHQ - 2 Score 6 6 3    Altered sleeping 3 3 2    Tired, decreased energy 3 3 2    Change in appetite 1 1 1    Feeling bad or failure about yourself  0 0 0   Trouble concentrating 0 0 0   Moving slowly or fidgety/restless 0 0 0   Suicidal thoughts 0 0 0   PHQ-9 Score 13 13 8    Difficult doing work/chores Not difficult at all Not difficult at all Not difficult at all      Psychosocial Evaluation and Intervention:   Psychosocial Re-Evaluation:     Psychosocial Re-Evaluation    Row Name 06/24/16 0854 07/17/16 1631 08/14/16 1325 08/14/16 1330 09/16/16 1532     Psychosocial Re-Evaluation   Interventions  - Encouraged to attend Pulmonary Rehabilitation for the exercise;Relaxation education;Stress management education Encouraged to attend Pulmonary Rehabilitation for the exercise   encouraged Shaniquah to talk to her PMD about her anxiety levels.  -  -   Comments Ms Imbert has had medical issues and has not attended since 04/19/2016. IN talking with her several times, she states she is looking forward to returning to [place] for it has been so encouraging for her both mentally and physically. Najiyah's psychosocial assessment reveals no barriers to participation in Pulmonary Rehab. Psychosocial areas that are currently affecting patient's rehab experience include concerns about treatment decisions, dealing with as evidenced by fear and worry about another syncopal episode occurring without warning.Stanton Kidney does continue to exhibit positive coping skills to deal with her psychosocial concerns. Offered emotional support and reassurance. Yaritza does feel she is making progress toward Pulmonary Rehab goals, even though she has been  out for several months because of syncopal episodes and evaluation. Makiba reports her health and activity level HAS NOT improved in the past 30 days, since she has not been in the program for that time period.Marland Kitchenas evidenced by patient's report of unchanged ability to exercise. Patient reports feeling positive about current and projected progression in Pulmonary Rehab. After reviewing the patient's treatment plan, the patient is making progress toward Pulmonary Rehab goals. Patient's rate of progress toward rehab goals is good. Plan of action to help patient continue to work towards rehab goals include time with psychosocial counselor to review the fear of another episode and to look at coping mechanisms to help decrease fears. . Will continue to monitor and evaluate progress toward psychosocial goal(s). Patient psychosocial assessment reveals no barriers to participation in Pulmonary Rehab. Psychosocial areas that are currently affecting patient's rehab experience include concerns about treatment decisions, and emotional problems as evidenced by fear of another syncopal episode occurring. Patient does continue to exhibit positive coping skills to deal with her psychosocial concerns. Offered emotional support and reassurance. Patient does feel she is making progress toward Pulmonary Rehab goals. Patient reports her health and activity level has improved in the past 30 days as evidenced by patient's report of increasedability to complete her exercise prescription and improved ability to manage daily activities.  Patient reports  feeling positive about current and projected progression in Pulmonary Rehab. After reviewing the patient's treatment plan, the patient is making progress toward Pulmonary Rehab goals. Patient's rate of progress toward rehab goals  is excellent. Plan of action to help patient continue to work towards rehab goals include decreasing fear/anxiety of another syncopal episode. Advised Leiloni to talk to  her primary MD about her "shoulders rising up to her ears" because of her anxiety. Will also have psychosocial staff talk to [facility] for any resource/techniques to help reduce anxiety. Will continue to monitor and evaluate progress toward psychosocial goal(s). Aftyn did for first time since syncopal episodes take her grandchildren with her in her car.  Ms Goldring has been attending [facility] regularly and enjoys the exercise, plus the socialization. She has had some stressful episodes lately with her grandchildren and school bus pick-up, but has worked through it, since she knows how helpful her care of the kids is to their parents. After [facility], Ms Poppy plans to attend [employer] for the security of being monitored during exercise.    Continued Psychosocial Services Needed  - Yes Yes  -  -   Row Name 10/04/16 1249 11/05/16 1519           Psychosocial Re-Evaluation   Interventions Encouraged to attend Pulmonary Rehabilitation for the exercise Encouraged to attend Pulmonary Rehabilitation for the exercise      Comments [facility] psychosocial assessment reveals no barriers at this time to participation in Pulmonary Rehab.  She has good family and friend support with her 2 daughters that encourage Janin to participate in [place] and progress with her goals.  Bhumi's shortness of breath concerns are monitored, but she has acknowledged that attending the program has helped to maintain quality life with improved mobility, self-care, and emotional and financial stability.  Yaneiry is commended for regular attendance and self-motivation to improve her pulmonary disease management. Imaria has been out of rehab since 10/03/16.  She was initially having some nasuea, but no barriers to coming since then.  She just has not attended.  She is going out of town for Christmas and hopes to return in January.      Continued Psychosocial Services Needed  - Yes        Education: Education Goals: Education classes will be provided  on a weekly basis, covering required topics. Participant will state understanding/return demonstration of topics presented.  Learning Barriers/Preferences:   Education Topics: Initial Evaluation Education: - Verbal, written and demonstration of respiratory meds, RPE/PD scales, oximetry and breathing techniques. Instruction on use of nebulizers and MDIs: cleaning and proper use, rinsing mouth with steroid doses and importance of monitoring MDI activations. Flowsheet Row Pulmonary Rehab from 10/02/2016 in [facility] Cardiac and Pulmonary Rehab  Date  01/09/16  Educator  LB  Instruction Review Code  2- meets goals/outcomes      General Nutrition Guidelines/Fats and Fiber: -Group instruction provided by verbal, written material, models and posters to present the general guidelines for heart healthy nutrition. Gives an explanation and review of dietary fats and fiber.   Controlling Sodium/Reading Food Labels: -Group verbal and written material supporting the discussion of sodium use in heart healthy nutrition. Review and explanation with models, verbal and written materials for utilization of the food label.   Exercise Physiology & Risk Factors: - Group verbal and written instruction with models to review the exercise physiology of the cardiovascular system and associated critical values. Details cardiovascular disease risk factors and the goals associated with each risk factor. Flowsheet Row Pulmonary Rehab from 10/02/2016 in [facility] Cardiac and Pulmonary Rehab  Date  10/02/16  Educator  AS  Instruction Review Code  2- meets goals/outcomes  Aerobic Exercise & Resistance Training: - Gives group verbal and written discussion on the health impact of inactivity. On the components of aerobic and resistive training programs and the benefits of this training and how to safely progress through these programs. Flowsheet Row Pulmonary Rehab from 10/02/2016 in [facility] Cardiac and Pulmonary Rehab  Date   08/07/16  Educator  JH/AS  Instruction Review Code  2- meets goals/outcomes      Flexibility, Balance, General Exercise Guidelines: - Provides group verbal and written instruction on the benefits of flexibility and balance training programs. Provides general exercise guidelines with specific guidelines to those with heart or lung disease. Demonstration and skill practice provided. Flowsheet Row Pulmonary Rehab from 10/02/2016 in [facility] Cardiac and Pulmonary Rehab  Date  03/27/16  Educator  Salley Hews, PT`  Instruction Review Code  2- meets goals/outcomes      Stress Management: - Provides group verbal and written instruction about the health risks of elevated stress, cause of high stress, and healthy ways to reduce stress.   Depression: - Provides group verbal and written instruction on the correlation between heart/lung disease and depressed mood, treatment options, and the stigmas associated with seeking treatment.   Exercise & Equipment Safety: - Individual verbal instruction and demonstration of equipment use and safety with use of the equipment.   Infection Prevention: - Provides verbal and written material to individual with discussion of infection control including proper hand washing and proper equipment cleaning during exercise session.   Falls Prevention: - Provides verbal and written material to individual with discussion of falls prevention and safety. Flowsheet Row Pulmonary Rehab from 10/02/2016 in [facility] Cardiac and Pulmonary Rehab  Date  01/09/16  Educator  LB  Instruction Review Code  2- meets goals/outcomes      Diabetes: - Individual verbal and written instruction to review signs/symptoms of diabetes, desired ranges of glucose level fasting, after meals and with exercise. Advice that pre and post exercise glucose checks will be done for 3 sessions at entry of program.   Chronic Lung Diseases: - Group verbal and written instruction to review new updates,  new respiratory medications, new advancements in procedures and treatments. Provide informative websites and "800" numbers of self-education.   Lung Procedures: - Group verbal and written instruction to describe testing methods done to diagnose lung disease. Review the outcome of test results. Describe the treatment choices: Pulmonary Function Tests, ABGs and oximetry. Flowsheet Row Pulmonary Rehab from 10/02/2016 in [facility] Cardiac and Pulmonary Rehab  Date  04/12/16  Educator  [place]  Instruction Review Code  2- meets goals/outcomes      Energy Conservation: - Provide group verbal and written instruction for methods to conserve energy, plan and organize activities. Instruct on pacing techniques, use of adaptive equipment and posture/positioning to relieve shortness of breath.   Triggers: - Group verbal and written instruction to review types of environmental controls: home humidity, furnaces, filters, dust mite/pet prevention, HEPA vacuums. To discuss weather changes, air quality and the benefits of nasal washing. Flowsheet Row Pulmonary Rehab from 10/02/2016 in [facility] Cardiac and Pulmonary Rehab  Date  07/29/16  Educator  LB  Instruction Review Code  2- meets goals/outcomes      Exacerbations: - Group verbal and written instruction to provide: warning signs, infection symptoms, calling MD promptly, preventive modes, and value of vaccinations. Review: effective airway clearance, coughing and/or vibration techniques. Create an Sports administrator.   Oxygen: - Individual and group verbal and written instruction on oxygen therapy. Includes supplement  oxygen, available portable oxygen systems, continuous and intermittent flow rates, oxygen safety, concentrators, and Medicare reimbursement for oxygen.   Respiratory Medications: - Group verbal and written instruction to review medications for lung disease. Drug class, frequency, complications, importance of spacers, rinsing mouth after steroid MDI's,  and proper cleaning methods for nebulizers. Flowsheet Row Pulmonary Rehab from 10/02/2016 in [facility] Cardiac and Pulmonary Rehab  Date  01/09/16  Educator  LB  Instruction Review Code  2- meets goals/outcomes      AED/CPR: - Group verbal and written instruction with the use of models to demonstrate the basic use of the AED with the basic ABC's of resuscitation. Flowsheet Row Pulmonary Rehab from 10/02/2016 in [facility] Cardiac and Pulmonary Rehab  Date  04/05/16  Educator  CE  Instruction Review Code  2- meets goals/outcomes      Breathing Retraining: - Provides individuals verbal and written instruction on purpose, frequency, and proper technique of diaphragmatic breathing and pursed-lipped breathing. Applies individual practice skills. Flowsheet Row Pulmonary Rehab from 10/02/2016 in [facility] Cardiac and Pulmonary Rehab  Date  01/09/16  Educator  LB  Instruction Review Code  2- meets goals/outcomes      Anatomy and Physiology of the Lungs: - Group verbal and written instruction with the use of models to provide basic lung anatomy and physiology related to function, structure and complications of lung disease. Flowsheet Row Pulmonary Rehab from 10/02/2016 in [facility] Cardiac and Pulmonary Rehab  Date  07/12/16  Educator  [place]  Instruction Review Code  2- meets goals/outcomes      Heart Failure: - Group verbal and written instruction on the basics of heart failure: signs/symptoms, treatments, explanation of ejection fraction, enlarged heart and cardiomyopathy. Flowsheet Row Pulmonary Rehab from 10/02/2016 in [facility] Cardiac and Pulmonary Rehab  Date  07/19/16  Educator  [place]  Instruction Review Code  2- meets goals/outcomes      Sleep Apnea: - Individual verbal and written instruction to review Obstructive Sleep Apnea. Review of risk factors, methods for diagnosing and types of masks and machines for OSA.   Anxiety: - Provides group, verbal and written instruction on the correlation between  heart/lung disease and anxiety, treatment options, and management of anxiety.   Relaxation: - Provides group, verbal and written instruction about the benefits of relaxation for patients with heart/lung disease. Also provides patients with examples of relaxation techniques. Flowsheet Row Pulmonary Rehab from 10/02/2016 in [place] Cardiac and Pulmonary Rehab  Date  04/17/16  Educator  Elissa Hefty  Instruction Review Code  2- Meets goals/outcomes      Knowledge Questionnaire Score:     Knowledge Questionnaire Score - 08/30/16 1336      Knowledge Questionnaire Score   Pre Score 10/10   Post Score 10/10       Core Components/Risk Factors/Patient Goals at Admission:   Core Components/Risk Factors/Patient Goals Review:      Goals and Risk Factor Review    Row Name 07/12/16 1455 08/02/16 1254 08/07/16 1305 08/14/16 1327 09/06/16 1225     Core Components/Risk Factors/Patient Goals Review   Personal Goals Review Hypertension;Sedentary;Increase Strength and Stamina Sedentary;Increase Strength and Stamina Hypertension Increase knowledge of respiratory medications and ability to use respiratory devices properly.;Improve shortness of breath with ADL's;Develop more efficient breathing techniques such as purse lipped breathing and diaphragmatic breathing and practicing self-pacing with activity. Sedentary;Increase Strength and Stamina;Weight Management/Obesity;Improve shortness of breath with ADL's;Develop more efficient breathing techniques such as purse lipped breathing and diaphragmatic breathing and practicing self-pacing with activity.;Increase  knowledge of respiratory medications and ability to use respiratory devices properly.;Hypertension   Review Taleaha has been out for 3 months and states it is hard starting back.  Bps have been good in [facility] classes. Armeta has not started walking at home. She is looking for her walker to be able to take with her for balance.  She is doing handweights and  is planning to start a gait and balance program after graduating. Tyeasha's blood pressure was elevated slightly today at 130/70 but normal for her is 112/70. Ta is doing well with her medication compliance and MDI use. She demonstrates great PLB technique while exercising.  She has stated that she has had to use her neulizer more often this week. She wil talk to her Pulmonary MD tomorrow about her nebulizer use. Taelyr has been doing well in rehab.  She is walking one extra day a week at home.  She is checking her blood pressure some at home and it has been in the 120/70s.  She is taking her medication in the morning and has noticed that it is higher in the evenings.  She is doing better with being able to do her ADLs with less SOB.  She is using her pursed lip breathing more regularly.  She mentioned that it still varies from day to day with her nebulizer use as dome days she needs it three times and others she good after just one treatment.     Expected Outcomes Kati will continue to improve strength and stamina with regular participation in [place]. Bracie will continue to come to exercise to improve stamina and start walking at home. Stable blood pressure. Continues compliance with her medications. Changes as physican orders for use of nebulizer, Continues use of PLB with her activities. Rumer will continue to improve by coming the class and using PLB.   [place] Name 10/04/16 1236             Core Components/Risk Factors/Patient Goals Review   Personal Goals Review Sedentary;Increase Strength and Stamina;Weight Management/Obesity;Improve shortness of breath with ADL's;Develop more efficient breathing techniques such as purse lipped breathing and diaphragmatic breathing and practicing self-pacing with activity.;Increase knowledge of respiratory medications and ability to use respiratory devices properly.;Hypertension       Review Isa states she is feeling better overall, with improved strength and stamina.   She has gained 5lbs (116lbs up to 121lbs) since starting the program.  Her shortness of breath is about the same, having good and bad days.  However, using pursed lip breathing helps on the bad days.  She denies any problems with her medications.  Her resting blood pressure is good, but does increase with exercise.  She notes she has been able to increase her the intensity on machines in class.         Expected Outcomes Nikitta is set to graduate next week and plans to go to [employer].            Core Components/Risk Factors/Patient Goals at Discharge (Final Review):      Goals and Risk Factor Review - 10/04/16 1236      Core Components/Risk Factors/Patient Goals Review   Personal Goals Review Sedentary;Increase Strength and Stamina;Weight Management/Obesity;Improve shortness of breath with ADL's;Develop more efficient breathing techniques such as purse lipped breathing and diaphragmatic breathing and practicing self-pacing with activity.;Increase knowledge of respiratory medications and ability to use respiratory devices properly.;Hypertension   Review Eilzabeth states she is feeling better overall, with improved strength and stamina.  She has gained 5lbs (116lbs up to 121lbs) since starting the program.  Her shortness of breath is about the same, having good and bad days.  However, using pursed lip breathing helps on the bad days.  She denies any problems with her medications.  Her resting blood pressure is good, but does increase with exercise.  She notes she has been able to increase her the intensity on machines in class.     Expected Outcomes Rian is set to graduate next week and plans to go to [employer].        ITP Comments:     ITP Comments    Row Name 05/22/16 1348 06/03/16 0745 06/06/16 1418 06/21/16 1021 07/04/16 1309   ITP Comments Ms Kellar last visit was 04/19/16 Ms Kult called on 05/30/2016 to update her medical issues: she has just got off her 30 day heart monitor; she has an  appointment with her cardiologist on 06/18/2016; and then she will know when she can return to [place]. Ms Vannostrand last visit was 04/19/16.  She has an appointment with cardiologist 8/1 and will get clearance befor ereturning to [place]. Ms Woo has not attended since 04/19/16 Ms Colver has not attended [facility] since 04/19/16.   Shirley Name 08/15/16 1049 10/22/16 1548 11/05/16 1519       ITP Comments Carey is progressing well with exercise. Called Ms Cartagena - last attended 10/03/16. She had a busy Thanksgiving and last week had a migraine with nausea. Ms Paradowski hopes to return this Friday. Called to check on status of return.  She has not been coming.  She hopes to return in January.  She still wants to finish her last two visits.        Comments: 30 day review

## 2016-12-02 ENCOUNTER — Encounter: Payer: Self-pay | Admitting: Respiratory Therapy

## 2016-12-02 DIAGNOSIS — J449 Chronic obstructive pulmonary disease, unspecified: Secondary | ICD-10-CM

## 2016-12-02 NOTE — Progress Notes (Signed)
Pulmonary Individual Treatment Plan  Patient Details  Name: Jessica Bowman MRN: [id number] Date of Birth: [birth year] Referring Provider:   Arn Medal Row Documentation from 04/29/2016 in [facility] Cardiac and Pulmonary Rehab  Referring Provider  Dr. Raul Del      Initial Encounter Date:  Flowsheet Row Documentation from 04/29/2016 in [facility] Cardiac and Pulmonary Rehab  Date  01/09/16  Referring Provider  Dr. Raul Del      Visit Diagnosis: COPD, moderate ([place])  Patient's Home Medications on Admission:  Current Outpatient Prescriptions:    albuterol (PROAIR HFA) 108 (90 Base) MCG/ACT inhaler, Inhale 2 puffs into the lungs See admin instructions. Inhale 2 puffs every 4 to 6 hours as needed for shortness of breath/wheezing., Disp: , Rfl:    aspirin EC 81 MG tablet, Take 81 mg by mouth at bedtime. , Disp: , Rfl:    Cholecalciferol (VITAMIN D3) 1000 units CAPS, Take 1,000 Units by mouth daily. , Disp: , Rfl:    cyclobenzaprine (FLEXERIL) 5 MG tablet, Take 5 mg by mouth daily as needed for muscle spasms. , Disp: , Rfl:    escitalopram (LEXAPRO) 10 MG tablet, Take 5-10 mg by mouth See admin instructions. Take 1/2 tablet  (5 mg) by mouth every morning, and take 1 tablet by mouth every night at bedtime., Disp: , Rfl:    fluticasone furoate-vilanterol (BREO ELLIPTA) 100-25 MCG/INH AEPB, Inhale 1 puff into the lungs daily. , Disp: , Rfl:    ipratropium-albuterol (DUONEB) 0.5-2.5 (3) MG/3ML SOLN, Take 3 mLs by nebulization 2 (two) times daily as needed. For shortness of breath/wheezing., Disp: , Rfl:    irbesartan-hydrochlorothiazide (AVALIDE) 150-12.5 MG tablet, Take 0.5 tablets by mouth daily. , Disp: , Rfl:    LUMIGAN 0.01 % SOLN, Place 1 drop into both eyes at bedtime., Disp: , Rfl:    Melatonin 5 MG TABS, Take 5 mg by mouth at bedtime as needed. For sleep., Disp: , Rfl:    Multiple Vitamins-Minerals (STRESSTABS ADVANCED) TABS, Take 1 tablet by mouth daily., Disp: , Rfl:    Probiotic Product  (Edcouch), Take 1 capsule by mouth daily. , Disp: , Rfl:   Past Medical History: Past Medical History:  Diagnosis Date   COPD (chronic obstructive pulmonary disease) ([place])    Glaucoma    Hypertension    Osteoporosis     Tobacco Use: History  Smoking Status   Former Smoker   Quit date: 04/20/2012  Smokeless Tobacco   Not on file    Labs: Recent Review Flowsheet Data    There is no flowsheet data to display.       ADL UCSD:     Pulmonary Assessment Scores    Row Name 08/30/16 1337         ADL UCSD   ADL Phase Exit     SOB Score total 58     Rest 0     Walk 3     Stairs 4     Bath 1     Dress 1     Shop 1        Pulmonary Function Assessment:   Exercise Target Goals:    Exercise Program Goal: Individual exercise prescription set with THRR, safety & activity barriers. Participant demonstrates ability to understand and report RPE using BORG scale, to self-measure pulse accurately, and to acknowledge the importance of the exercise prescription.  Exercise Prescription Goal: Starting with aerobic activity 30 plus minutes a day, 3 days per week for initial exercise  prescription. Provide home exercise prescription and guidelines that participant acknowledges understanding prior to discharge.  Activity Barriers & Risk Stratification:   6 Minute Walk:     6 Minute Walk    Row Name 09/20/16 1221         6 Minute Walk   Phase Discharge     Distance 1040 feet     Distance % Change -9.9 %  decreased 115 ft     Walk Time 5.32 minutes     # of Rest Breaks 4  9 sec, 9 sec, 13 sec, 10 sec     MPH 2.22     METS 2.56     RPE 14     Perceived Dyspnea  4     VO2 Peak 8.97     Symptoms No     Resting HR 88 bpm     Resting BP 110/70     Max Ex. HR 121 bpm     Max Ex. BP 134/64     2 Minute Post BP 132/70       Interval HR   Baseline HR 88     1 Minute HR 108     2 Minute HR 109     3 Minute HR 111     4 Minute HR 110     5  Minute HR 114     6 Minute HR 121     2 Minute Post HR 109     Interval Heart Rate? Yes       Interval Oxygen   Interval Oxygen? Yes     Baseline Oxygen Saturation % 95 %     Baseline Liters of Oxygen 0 L  Room Air     1 Minute Oxygen Saturation % 92 %     1 Minute Liters of Oxygen 0 L     2 Minute Oxygen Saturation % 86 %  stopped at 2:20 83%     2 Minute Liters of Oxygen 0 L     3 Minute Oxygen Saturation % 90 %  stopped at  2:55 81%     3 Minute Liters of Oxygen 0 L     4 Minute Oxygen Saturation % 91 %     4 Minute Liters of Oxygen 0 L     5 Minute Oxygen Saturation % 85 %  stopped at 4:59 leg fatigue (87%)     5 Minute Liters of Oxygen 0 L     6 Minute Oxygen Saturation % 88 %  stopped at 5:22 83%     6 Minute Liters of Oxygen 0 L     2 Minute Post Oxygen Saturation % 97 %     2 Minute Post Liters of Oxygen 0 L        Initial Exercise Prescription:   Perform Capillary Blood Glucose checks as needed.  Exercise Prescription Changes:     Exercise Prescription Changes    Row Name 07/17/16 1300 07/30/16 1500 07/31/16 1400 08/15/16 1000 08/28/16 1500     Exercise Review   Progression Yes Yes Yes Yes Yes     Response to Exercise   Blood Pressure (Admit)  -- 120/64  -- 124/70 118/66   Blood Pressure (Exercise) 118/60 158/82  -- 128/82 150/80   Blood Pressure (Exit) 120/70 100/60  -- 118/66 118/60   Heart Rate (Admit) 83 bpm 109 bpm  -- 104 bpm 89 bpm   Heart Rate (Exercise) 124 bpm 123 bpm  --  119 bpm 124 bpm   Heart Rate (Exit) 91 bpm 104 bpm  -- 100 bpm 106 bpm   Oxygen Saturation (Admit) 94 % 96 %  -- 94 % 97 %   Oxygen Saturation (Exercise) 94 % 91 %  -- 88 % 96 %   Oxygen Saturation (Exit) 95 % 94 %  -- 96 % 96 %   Rating of Perceived Exertion (Exercise) 14 13  -- 12 12   Perceived Dyspnea (Exercise) 4 3  -- 3 3   Symptoms  --  -- none none none   Comments  --  -- Home Exercise Guidelines given 07/31/16  -- Home Exercise Guidelines given 07/31/16   Duration   --  --  --  -- Progress to 45 minutes of aerobic exercise without signs/symptoms of physical distress   Intensity THRR unchanged THRR unchanged THRR unchanged THRR unchanged THRR unchanged     Progression   Progression Continue to progress workloads to maintain intensity without signs/symptoms of physical distress. Continue to progress workloads to maintain intensity without signs/symptoms of physical distress. Continue to progress workloads to maintain intensity without signs/symptoms of physical distress. Continue to progress workloads to maintain intensity without signs/symptoms of physical distress. Continue to progress workloads to maintain intensity without signs/symptoms of physical distress.   Average METs  --  --  --  -- 2.32     Resistance Training   Training Prescription Yes Yes Yes Yes Yes   Weight 3 3 3 3 3  lbs   Reps 10-12 10-12 10-12 10-15 10-12     Interval Training   Interval Training No No No No No     Treadmill   MPH 1.3 1.4 1.4 1.5 1.5   Grade 0 0 0 0.5 0.5   Minutes 15 15 15 15 15    METs  --  --  --  -- 2.25     NuStep   Level 3 3 3 4 3    Watts  --  --  --  -- --  2.7 METs   Minutes 15 15 15 15 15      T5 Nustep   Level  --  --  --  -- 4   Minutes  --  --  --  -- 15   METs  --  --  --  -- 2     Biostep-RELP   Level 3 3 3 3   --   Minutes 15 15 15 15   --   METs 2  --  --  --  --     Home Exercise Plan   Plans to continue exercise at  --  -- Home  walking  -- Home  walking   Frequency  --  -- Add 2 additional days to program exercise sessions.  -- Add 2 additional days to program exercise sessions.   Row Name 09/11/16 1600 09/25/16 1600 10/08/16 1500         Exercise Review   Progression Yes Yes Yes       Response to Exercise   Blood Pressure (Admit) 138/70 110/70 126/72     Blood Pressure (Exercise) 150/78 134/64 168/70     Blood Pressure (Exit) 104/64 126/70 116/60     Heart Rate (Admit) 90 bpm 85 bpm 78 bpm     Heart Rate (Exercise) 118 bpm  122 bpm 123 bpm     Heart Rate (Exit) 93 bpm 101 bpm 99 bpm     Oxygen Saturation (Admit) 97 % 95 %  95 %     Oxygen Saturation (Exercise) 92 % 91 % 88 %     Oxygen Saturation (Exit) 94 % 96 % 95 %     Rating of Perceived Exertion (Exercise) 13 13 13      Perceived Dyspnea (Exercise) 3 3 3      Symptoms none none none     Comments Home Exercise Guidelines given 07/31/16 Home Exercise Guidelines given 07/31/16 Home Exercise Guidelines given 07/31/16     Duration Progress to 45 minutes of aerobic exercise without signs/symptoms of physical distress Progress to 45 minutes of aerobic exercise without signs/symptoms of physical distress Progress to 45 minutes of aerobic exercise without signs/symptoms of physical distress     Intensity THRR unchanged THRR unchanged THRR unchanged       Progression   Progression Continue to progress workloads to maintain intensity without signs/symptoms of physical distress. Continue to progress workloads to maintain intensity without signs/symptoms of physical distress. Continue to progress workloads to maintain intensity without signs/symptoms of physical distress.     Average METs 2.32 2.5 2.62       Resistance Training   Training Prescription Yes Yes Yes     Weight 4 lbs 4 lbs 4 lbs     Reps 10-12 10-12 10-12       Interval Training   Interval Training No No No       Treadmill   MPH 1.5 1.8 1.8     Grade 0.5 1.5 1.5     Minutes 15 15 15      METs 2.25 2.75 2.75       NuStep   Level 3 4 4      Watts --  2.6 METs 74 spm --  3.0 METs --  3.1 METs     Minutes 15 15 15        Biostep-RELP   Level 4  -- 4     Watts --  42 spm  --  --     Minutes 15  -- 15     METs 2  -- 2       Home Exercise Plan   Plans to continue exercise at Home  walking Home  walking Home  walking     Frequency Add 2 additional days to program exercise sessions. Add 2 additional days to program exercise sessions. Add 2 additional days to program exercise sessions.         Exercise Comments:     Exercise Comments    Row Name 06/06/16 1417 06/21/16 1020 07/04/16 1309 07/10/16 1444 07/17/16 1342   Exercise Comments Ms Johnsons last visit was 04/19/16.  She has an appointment with cardiologist 8/1 and will get clearance befor ereturning to [place]. Ms Arender has not attended since 04/19/16 Ms Skirvin has not attended [facility] since 04/19/16. Lynann returned today without any problems. Kidada has done well her first 2 days [facility].   [place] Name 07/30/16 1552 07/31/16 1424 08/15/16 1048 08/28/16 1544 09/11/16 1611   Exercise Comments Ezri is progressing well with exercise. Reviewed home exercise with pt today.  Pt plans to walk at home for exercise.  Reviewed THR, pulse, RPE, sign and symptoms, and when to call 911 or MD.  Also discussed weather considerations and indoor options.  Pt voiced understanding. Shamya is progressing well with exercise. Santiaga returned today after being out for almost two weeks.  She was able to return without any problems.  We will continue to monitor for progression. Stanton Kidney  continues to do well with exercise.  She only have 6 more visits!  We will continue to monitor her progression.   [place] Name 09/25/16 1604 10/08/16 1545         Exercise Comments Janella has 4 visits to go!  She is going to try to increase her incline on her next visit.  We will continue to monitor her progresison. Yamika has 2 more visits!  She is doing well with the extra incline.  We will continue to monitor her progression.         Discharge Exercise Prescription (Final Exercise Prescription Changes):     Exercise Prescription Changes - 10/08/16 1500      Exercise Review   Progression Yes     Response to Exercise   Blood Pressure (Admit) 126/72   Blood Pressure (Exercise) 168/70   Blood Pressure (Exit) 116/60   Heart Rate (Admit) 78 bpm   Heart Rate (Exercise) 123 bpm   Heart Rate (Exit) 99 bpm   Oxygen Saturation (Admit) 95 %   Oxygen Saturation (Exercise) 88 %    Oxygen Saturation (Exit) 95 %   Rating of Perceived Exertion (Exercise) 13   Perceived Dyspnea (Exercise) 3   Symptoms none   Comments Home Exercise Guidelines given 07/31/16   Duration Progress to 45 minutes of aerobic exercise without signs/symptoms of physical distress   Intensity THRR unchanged     Progression   Progression Continue to progress workloads to maintain intensity without signs/symptoms of physical distress.   Average METs 2.62     Resistance Training   Training Prescription Yes   Weight 4 lbs   Reps 10-12     Interval Training   Interval Training No     Treadmill   MPH 1.8   Grade 1.5   Minutes 15   METs 2.75     NuStep   Level 4   Watts --  3.1 METs   Minutes 15     Biostep-RELP   Level 4   Minutes 15   METs 2     Home Exercise Plan   Plans to continue exercise at Home  walking   Frequency Add 2 additional days to program exercise sessions.       Nutrition:  Target Goals: Understanding of nutrition guidelines, daily intake of sodium 1500mg , cholesterol 200mg , calories 30% from fat and 7% or less from saturated fats, daily to have 5 or more servings of fruits and vegetables.  Biometrics:    Nutrition Therapy Plan and Nutrition Goals:   Nutrition Discharge: Rate Your Plate Scores:   Psychosocial: Target Goals: Acknowledge presence or absence of depression, maximize coping skills, provide positive support system. Participant is able to verbalize types and ability to use techniques and skills needed for reducing stress and depression.  Initial Review & Psychosocial Screening:   Quality of Life Scores:     Quality of Life - 09/04/16 1552      Quality of Life Scores   Health/Function Pre 19.6 %   Health/Function Post 20.8 %   Health/Function % Change 6.12 %   Socioeconomic Pre 27 %   Socioeconomic Post 20.67 %   Socioeconomic % Change  -23.44 %   Psych/Spiritual Pre 22.29 %   Psych/Spiritual Post 21 %   Psych/Spiritual %  Change -5.79 %   Family Pre 19.5 %   Family Post 21 %   Family % Change 7.69 %   GLOBAL Pre 21.56 %   GLOBAL Post 20.84 %  GLOBAL % Change -3.34 %      PHQ-9: Recent Review Flowsheet Data    Depression screen [facility] 2/9 09/04/2016 08/30/2016 01/09/2016   Decreased Interest 3 3 2    Down, Depressed, Hopeless 3 3 1    PHQ - 2 Score 6 6 3    Altered sleeping 3 3 2    Tired, decreased energy 3 3 2    Change in appetite 1 1 1    Feeling bad or failure about yourself  0 0 0   Trouble concentrating 0 0 0   Moving slowly or fidgety/restless 0 0 0   Suicidal thoughts 0 0 0   PHQ-9 Score 13 13 8    Difficult doing work/chores Not difficult at all Not difficult at all Not difficult at all      Psychosocial Evaluation and Intervention:   Psychosocial Re-Evaluation:     Psychosocial Re-Evaluation    Row Name 06/24/16 0854 07/17/16 1631 08/14/16 1325 08/14/16 1330 09/16/16 1532     Psychosocial Re-Evaluation   Interventions  -- Encouraged to attend Pulmonary Rehabilitation for the exercise;Relaxation education;Stress management education Encouraged to attend Pulmonary Rehabilitation for the exercise   encouraged Isaiah to talk to her PMD about her anxiety levels.  --  --   Comments Ms Sittler has had medical issues and has not attended since 04/19/2016. IN talking with her several times, she states she is looking forward to returning to [place] for it has been so encouraging for her both mentally and physically. Azzure's psychosocial assessment reveals no barriers to participation in Pulmonary Rehab. Psychosocial areas that are currently affecting patient's rehab experience include concerns about treatment decisions, dealing with as evidenced by fear and worry about another syncopal episode occurring without warning.Stanton Kidney does continue to exhibit positive coping skills to deal with her psychosocial concerns. Offered emotional support and reassurance. Azyria does feel she is making progress toward  Pulmonary Rehab goals, even though she has been out for several months because of syncopal episodes and evaluation. Gwendelyn reports her health and activity level HAS NOT improved in the past 30 days, since she has not been in the program for that time period.Marland Kitchenas evidenced by patient's report of unchanged ability to exercise. Patient reports feeling positive about current and projected progression in Pulmonary Rehab. After reviewing the patient's treatment plan, the patient is making progress toward Pulmonary Rehab goals. Patient's rate of progress toward rehab goals is good. Plan of action to help patient continue to work towards rehab goals include time with psychosocial counselor to review the fear of another episode and to look at coping mechanisms to help decrease fears. . Will continue to monitor and evaluate progress toward psychosocial goal(s). Patient psychosocial assessment reveals no barriers to participation in Pulmonary Rehab. Psychosocial areas that are currently affecting patient's rehab experience include concerns about treatment decisions, and emotional problems as evidenced by fear of another syncopal episode occurring. Patient does continue to exhibit positive coping skills to deal with her psychosocial concerns. Offered emotional support and reassurance. Patient does feel she is making progress toward Pulmonary Rehab goals. Patient reports her health and activity level has improved in the past 30 days as evidenced by patient's report of increasedability to complete her exercise prescription and improved ability to manage daily activities.  Patient reports  feeling positive about current and projected progression in Pulmonary Rehab. After reviewing the patient's treatment plan, the patient is making progress toward Pulmonary Rehab goals. Patient's rate of progress toward rehab goals is excellent. Plan of action to  help patient continue to work towards rehab goals include decreasing fear/anxiety of  another syncopal episode. Advised Amaliya to talk to her primary MD about her "shoulders rising up to her ears" because of her anxiety. Will also have psychosocial staff talk to [facility] for any resource/techniques to help reduce anxiety. Will continue to monitor and evaluate progress toward psychosocial goal(s). Cattaleya did for first time since syncopal episodes take her grandchildren with her in her car.  Ms Cappa has been attending [facility] regularly and enjoys the exercise, plus the socialization. She has had some stressful episodes lately with her grandchildren and school bus pick-up, but has worked through it, since she knows how helpful her care of the kids is to their parents. After [facility], Ms Aydt plans to attend [employer] for the security of being monitored during exercise.    Continued Psychosocial Services Needed  -- Yes Yes  --  --   [place] Name 10/04/16 1249 11/05/16 1519           Psychosocial Re-Evaluation   Interventions Encouraged to attend Pulmonary Rehabilitation for the exercise Encouraged to attend Pulmonary Rehabilitation for the exercise      Comments [facility] psychosocial assessment reveals no barriers at this time to participation in Pulmonary Rehab.  She has good family and friend support with her 2 daughters that encourage Aquanetta to participate in [place] and progress with her goals.  Wanona's shortness of breath concerns are monitored, but she has acknowledged that attending the program has helped to maintain quality life with improved mobility, self-care, and emotional and financial stability.  Adonia is commended for regular attendance and self-motivation to improve her pulmonary disease management. Santosh has been out of rehab since 10/03/16.  She was initially having some nasuea, but no barriers to coming since then.  She just has not attended.  She is going out of town for Christmas and hopes to return in January.      Continued Psychosocial Services Needed  -- Yes         Education: Education Goals: Education classes will be provided on a weekly basis, covering required topics. Participant will state understanding/return demonstration of topics presented.  Learning Barriers/Preferences:   Education Topics: Initial Evaluation Education: - Verbal, written and demonstration of respiratory meds, RPE/PD scales, oximetry and breathing techniques. Instruction on use of nebulizers and MDIs: cleaning and proper use, rinsing mouth with steroid doses and importance of monitoring MDI activations. Flowsheet Row Pulmonary Rehab from 10/02/2016 in [facility] Cardiac and Pulmonary Rehab  Date  01/09/16  Educator  LB  Instruction Review Code  2- meets goals/outcomes      General Nutrition Guidelines/Fats and Fiber: -Group instruction provided by verbal, written material, models and posters to present the general guidelines for heart healthy nutrition. Gives an explanation and review of dietary fats and fiber.   Controlling Sodium/Reading Food Labels: -Group verbal and written material supporting the discussion of sodium use in heart healthy nutrition. Review and explanation with models, verbal and written materials for utilization of the food label.   Exercise Physiology & Risk Factors: - Group verbal and written instruction with models to review the exercise physiology of the cardiovascular system and associated critical values. Details cardiovascular disease risk factors and the goals associated with each risk factor. Flowsheet Row Pulmonary Rehab from 10/02/2016 in [facility] Cardiac and Pulmonary Rehab  Date  10/02/16  Educator  AS  Instruction Review Code  2- meets goals/outcomes      Aerobic Exercise & Resistance Training: -  Gives group verbal and written discussion on the health impact of inactivity. On the components of aerobic and resistive training programs and the benefits of this training and how to safely progress through these programs. Flowsheet Row Pulmonary  Rehab from 10/02/2016 in [facility] Cardiac and Pulmonary Rehab  Date  08/07/16  Educator  JH/AS  Instruction Review Code  2- meets goals/outcomes      Flexibility, Balance, General Exercise Guidelines: - Provides group verbal and written instruction on the benefits of flexibility and balance training programs. Provides general exercise guidelines with specific guidelines to those with heart or lung disease. Demonstration and skill practice provided. Flowsheet Row Pulmonary Rehab from 10/02/2016 in [facility] Cardiac and Pulmonary Rehab  Date  03/27/16  Educator  Salley Hews, PT`  Instruction Review Code  2- meets goals/outcomes      Stress Management: - Provides group verbal and written instruction about the health risks of elevated stress, cause of high stress, and healthy ways to reduce stress.   Depression: - Provides group verbal and written instruction on the correlation between heart/lung disease and depressed mood, treatment options, and the stigmas associated with seeking treatment.   Exercise & Equipment Safety: - Individual verbal instruction and demonstration of equipment use and safety with use of the equipment.   Infection Prevention: - Provides verbal and written material to individual with discussion of infection control including proper hand washing and proper equipment cleaning during exercise session.   Falls Prevention: - Provides verbal and written material to individual with discussion of falls prevention and safety. Flowsheet Row Pulmonary Rehab from 10/02/2016 in [facility] Cardiac and Pulmonary Rehab  Date  01/09/16  Educator  LB  Instruction Review Code  2- meets goals/outcomes      Diabetes: - Individual verbal and written instruction to review signs/symptoms of diabetes, desired ranges of glucose level fasting, after meals and with exercise. Advice that pre and post exercise glucose checks will be done for 3 sessions at entry of program.   Chronic Lung  Diseases: - Group verbal and written instruction to review new updates, new respiratory medications, new advancements in procedures and treatments. Provide informative websites and "800" numbers of self-education.   Lung Procedures: - Group verbal and written instruction to describe testing methods done to diagnose lung disease. Review the outcome of test results. Describe the treatment choices: Pulmonary Function Tests, ABGs and oximetry. Flowsheet Row Pulmonary Rehab from 10/02/2016 in [facility] Cardiac and Pulmonary Rehab  Date  04/12/16  Educator  Tyrone  Instruction Review Code  2- meets goals/outcomes      Energy Conservation: - Provide group verbal and written instruction for methods to conserve energy, plan and organize activities. Instruct on pacing techniques, use of adaptive equipment and posture/positioning to relieve shortness of breath.   Triggers: - Group verbal and written instruction to review types of environmental controls: home humidity, furnaces, filters, dust mite/pet prevention, HEPA vacuums. To discuss weather changes, air quality and the benefits of nasal washing. Flowsheet Row Pulmonary Rehab from 10/02/2016 in [facility] Cardiac and Pulmonary Rehab  Date  07/29/16  Educator  LB  Instruction Review Code  2- meets goals/outcomes      Exacerbations: - Group verbal and written instruction to provide: warning signs, infection symptoms, calling MD promptly, preventive modes, and value of vaccinations. Review: effective airway clearance, coughing and/or vibration techniques. Create an Sports administrator.   Oxygen: - Individual and group verbal and written instruction on oxygen therapy. Includes supplement oxygen, available portable oxygen systems, continuous  and intermittent flow rates, oxygen safety, concentrators, and Medicare reimbursement for oxygen.   Respiratory Medications: - Group verbal and written instruction to review medications for lung disease. Drug class, frequency,  complications, importance of spacers, rinsing mouth after steroid MDI's, and proper cleaning methods for nebulizers. Flowsheet Row Pulmonary Rehab from 10/02/2016 in [facility] Cardiac and Pulmonary Rehab  Date  01/09/16  Educator  LB  Instruction Review Code  2- meets goals/outcomes      AED/CPR: - Group verbal and written instruction with the use of models to demonstrate the basic use of the AED with the basic ABC's of resuscitation. Flowsheet Row Pulmonary Rehab from 10/02/2016 in [facility] Cardiac and Pulmonary Rehab  Date  04/05/16  Educator  CE  Instruction Review Code  2- meets goals/outcomes      Breathing Retraining: - Provides individuals verbal and written instruction on purpose, frequency, and proper technique of diaphragmatic breathing and pursed-lipped breathing. Applies individual practice skills. Flowsheet Row Pulmonary Rehab from 10/02/2016 in [facility] Cardiac and Pulmonary Rehab  Date  01/09/16  Educator  LB  Instruction Review Code  2- meets goals/outcomes      Anatomy and Physiology of the Lungs: - Group verbal and written instruction with the use of models to provide basic lung anatomy and physiology related to function, structure and complications of lung disease. Flowsheet Row Pulmonary Rehab from 10/02/2016 in [facility] Cardiac and Pulmonary Rehab  Date  07/12/16  Educator  [place]  Instruction Review Code  2- meets goals/outcomes      Heart Failure: - Group verbal and written instruction on the basics of heart failure: signs/symptoms, treatments, explanation of ejection fraction, enlarged heart and cardiomyopathy. Flowsheet Row Pulmonary Rehab from 10/02/2016 in [facility] Cardiac and Pulmonary Rehab  Date  07/19/16  Educator  [place]  Instruction Review Code  2- meets goals/outcomes      Sleep Apnea: - Individual verbal and written instruction to review Obstructive Sleep Apnea. Review of risk factors, methods for diagnosing and types of masks and machines for OSA.   Anxiety: -  Provides group, verbal and written instruction on the correlation between heart/lung disease and anxiety, treatment options, and management of anxiety.   Relaxation: - Provides group, verbal and written instruction about the benefits of relaxation for patients with heart/lung disease. Also provides patients with examples of relaxation techniques. Flowsheet Row Pulmonary Rehab from 10/02/2016 in [facility] Cardiac and Pulmonary Rehab  Date  04/17/16  Educator  Elissa Hefty  Instruction Review Code  2- Meets goals/outcomes      Knowledge Questionnaire Score:     Knowledge Questionnaire Score - 08/30/16 1336      Knowledge Questionnaire Score   Pre Score 10/10   Post Score 10/10       Core Components/Risk Factors/Patient Goals at Admission:   Core Components/Risk Factors/Patient Goals Review:      Goals and Risk Factor Review    Row Name 07/12/16 1455 08/02/16 1254 08/07/16 1305 08/14/16 1327 09/06/16 1225     Core Components/Risk Factors/Patient Goals Review   Personal Goals Review Hypertension;Sedentary;Increase Strength and Stamina Sedentary;Increase Strength and Stamina Hypertension Increase knowledge of respiratory medications and ability to use respiratory devices properly.;Improve shortness of breath with ADL's;Develop more efficient breathing techniques such as purse lipped breathing and diaphragmatic breathing and practicing self-pacing with activity. Sedentary;Increase Strength and Stamina;Weight Management/Obesity;Improve shortness of breath with ADL's;Develop more efficient breathing techniques such as purse lipped breathing and diaphragmatic breathing and practicing self-pacing with activity.;Increase knowledge of respiratory medications and ability  to use respiratory devices properly.;Hypertension   Review Tishia has been out for 3 months and states it is hard starting back.  Bps have been good in [facility] classes. Alenah has not started walking at home. She is looking for her  walker to be able to take with her for balance.  She is doing handweights and is planning to start a gait and balance program after graduating. Tonjua's blood pressure was elevated slightly today at 130/70 but normal for her is 112/70. Demonica is doing well with her medication compliance and MDI use. She demonstrates great PLB technique while exercising.  She has stated that she has had to use her neulizer more often this week. She wil talk to her Pulmonary MD tomorrow about her nebulizer use. Aslee has been doing well in rehab.  She is walking one extra day a week at home.  She is checking her blood pressure some at home and it has been in the 120/70s.  She is taking her medication in the morning and has noticed that it is higher in the evenings.  She is doing better with being able to do her ADLs with less SOB.  She is using her pursed lip breathing more regularly.  She mentioned that it still varies from day to day with her nebulizer use as dome days she needs it three times and others she good after just one treatment.     Expected Outcomes Contessia will continue to improve strength and stamina with regular participation in [place]. Naya will continue to come to exercise to improve stamina and start walking at home. Stable blood pressure. Continues compliance with her medications. Changes as physican orders for use of nebulizer, Continues use of PLB with her activities. Poppi will continue to improve by coming the class and using PLB.   [place] Name 10/04/16 1236             Core Components/Risk Factors/Patient Goals Review   Personal Goals Review Sedentary;Increase Strength and Stamina;Weight Management/Obesity;Improve shortness of breath with ADL's;Develop more efficient breathing techniques such as purse lipped breathing and diaphragmatic breathing and practicing self-pacing with activity.;Increase knowledge of respiratory medications and ability to use respiratory devices properly.;Hypertension       Review  Crystale states she is feeling better overall, with improved strength and stamina.  She has gained 5lbs (116lbs up to 121lbs) since starting the program.  Her shortness of breath is about the same, having good and bad days.  However, using pursed lip breathing helps on the bad days.  She denies any problems with her medications.  Her resting blood pressure is good, but does increase with exercise.  She notes she has been able to increase her the intensity on machines in class.         Expected Outcomes Kierrah is set to graduate next week and plans to go to [employer].            Core Components/Risk Factors/Patient Goals at Discharge (Final Review):      Goals and Risk Factor Review - 10/04/16 1236      Core Components/Risk Factors/Patient Goals Review   Personal Goals Review Sedentary;Increase Strength and Stamina;Weight Management/Obesity;Improve shortness of breath with ADL's;Develop more efficient breathing techniques such as purse lipped breathing and diaphragmatic breathing and practicing self-pacing with activity.;Increase knowledge of respiratory medications and ability to use respiratory devices properly.;Hypertension   Review Yagaira states she is feeling better overall, with improved strength and stamina.  She has gained 5lbs (116lbs  up to 121lbs) since starting the program.  Her shortness of breath is about the same, having good and bad days.  However, using pursed lip breathing helps on the bad days.  She denies any problems with her medications.  Her resting blood pressure is good, but does increase with exercise.  She notes she has been able to increase her the intensity on machines in class.     Expected Outcomes Aerabella is set to graduate next week and plans to go to [employer].        ITP Comments:     ITP Comments    Row Name 06/06/16 1418 06/21/16 1021 07/04/16 1309 08/15/16 1049 10/22/16 1548   ITP Comments Ms Johnsons last visit was 04/19/16.  She has an appointment with cardiologist  8/1 and will get clearance befor ereturning to [place]. Ms Mottl has not attended since 04/19/16 Ms Cesaro has not attended [facility] since 04/19/16. Luccia is progressing well with exercise. Called Ms Schons - last attended 10/03/16. She had a busy Thanksgiving and last week had a migraine with nausea. Ms Prothero hopes to return this Friday.   [place] Name 11/05/16 1519 12/02/16 0816         ITP Comments Called to check on status of return.  She has not been coming.  She hopes to return in January.  She still wants to finish her last two visits. Called Ms Drummey on 11/25/16 and 11/27/16 - no answer and unable to leave a messaage.         Comments: 30 day note review

## 2016-12-11 ENCOUNTER — Encounter: Payer: Self-pay | Admitting: Respiratory Therapy

## 2016-12-11 ENCOUNTER — Telehealth: Payer: Self-pay | Admitting: Respiratory Therapy

## 2016-12-11 DIAGNOSIS — J449 Chronic obstructive pulmonary disease, unspecified: Secondary | ICD-10-CM

## 2016-12-11 NOTE — Telephone Encounter (Signed)
Called Jessica Bowman. She last attended 10/03/16. Since she has only 2 sessions of [facility] left to finish, she will be discharged from [place] and started in [employer] for 3 days/week.

## 2016-12-11 NOTE — Progress Notes (Signed)
Pulmonary Individual Treatment Plan  Patient Details  Name: BEATRICE MIRE MRN: [id number] Date of Birth: 16-Aug-1941 Referring Provider:   Arn Medal Row Documentation from 04/29/2016 in [facility] Cardiac and Pulmonary Rehab  Referring Provider  Dr. Raul Del      Initial Encounter Date:  Flowsheet Row Documentation from 04/29/2016 in [facility] Cardiac and Pulmonary Rehab  Date  01/09/16  Referring Provider  Dr. Raul Del      Visit Diagnosis: COPD, moderate ([place])  Patient's Home Medications on Admission:  Current Outpatient Prescriptions:    albuterol (PROAIR HFA) 108 (90 Base) MCG/ACT inhaler, Inhale 2 puffs into the lungs See admin instructions. Inhale 2 puffs every 4 to 6 hours as needed for shortness of breath/wheezing., Disp: , Rfl:    aspirin EC 81 MG tablet, Take 81 mg by mouth at bedtime. , Disp: , Rfl:    Cholecalciferol (VITAMIN D3) 1000 units CAPS, Take 1,000 Units by mouth daily. , Disp: , Rfl:    cyclobenzaprine (FLEXERIL) 5 MG tablet, Take 5 mg by mouth daily as needed for muscle spasms. , Disp: , Rfl:    escitalopram (LEXAPRO) 10 MG tablet, Take 5-10 mg by mouth See admin instructions. Take 1/2 tablet  (5 mg) by mouth every morning, and take 1 tablet by mouth every night at bedtime., Disp: , Rfl:    fluticasone furoate-vilanterol (BREO ELLIPTA) 100-25 MCG/INH AEPB, Inhale 1 puff into the lungs daily. , Disp: , Rfl:    ipratropium-albuterol (DUONEB) 0.5-2.5 (3) MG/3ML SOLN, Take 3 mLs by nebulization 2 (two) times daily as needed. For shortness of breath/wheezing., Disp: , Rfl:    irbesartan-hydrochlorothiazide (AVALIDE) 150-12.5 MG tablet, Take 0.5 tablets by mouth daily. , Disp: , Rfl:    LUMIGAN 0.01 % SOLN, Place 1 drop into both eyes at bedtime., Disp: , Rfl:    Melatonin 5 MG TABS, Take 5 mg by mouth at bedtime as needed. For sleep., Disp: , Rfl:    Multiple Vitamins-Minerals (STRESSTABS ADVANCED) TABS, Take 1 tablet by mouth daily., Disp: , Rfl:    Probiotic Product  ([place]), Take 1 capsule by mouth daily. , Disp: , Rfl:   Past Medical History: Past Medical History:  Diagnosis Date   COPD (chronic obstructive pulmonary disease) ([place])    Glaucoma    Hypertension    Osteoporosis     Tobacco Use: History  Smoking Status   Former Smoker   Quit date: 04/20/2012  Smokeless Tobacco   Not on file    Labs: Recent Review Flowsheet Data    There is no flowsheet data to display.       ADL UCSD:     Pulmonary Assessment Scores    Row Name 08/30/16 1337         ADL UCSD   ADL Phase Exit     SOB Score total 58     Rest 0     Walk 3     Stairs 4     Bath 1     Dress 1     Shop 1        Pulmonary Function Assessment:   Exercise Target Goals:    Exercise Program Goal: Individual exercise prescription set with THRR, safety & activity barriers. Participant demonstrates ability to understand and report RPE using BORG scale, to self-measure pulse accurately, and to acknowledge the importance of the exercise prescription.  Exercise Prescription Goal: Starting with aerobic activity 30 plus minutes a day, 3 days per week for initial exercise  prescription. Provide home exercise prescription and guidelines that participant acknowledges understanding prior to discharge.  Activity Barriers & Risk Stratification:   6 Minute Walk:     6 Minute Walk    Row Name 09/20/16 1221         6 Minute Walk   Phase Discharge     Distance 1040 feet     Distance % Change -9.9 %  decreased 115 ft     Walk Time 5.32 minutes     # of Rest Breaks 4  9 sec, 9 sec, 13 sec, 10 sec     MPH 2.22     METS 2.56     RPE 14     Perceived Dyspnea  4     VO2 Peak 8.97     Symptoms No     Resting HR 88 bpm     Resting BP 110/70     Max Ex. HR 121 bpm     Max Ex. BP 134/64     2 Minute Post BP 132/70       Interval HR   Baseline HR 88     1 Minute HR 108     2 Minute HR 109     3 Minute HR 111     4 Minute HR 110     5  Minute HR 114     6 Minute HR 121     2 Minute Post HR 109     Interval Heart Rate? Yes       Interval Oxygen   Interval Oxygen? Yes     Baseline Oxygen Saturation % 95 %     Baseline Liters of Oxygen 0 L  Room Air     1 Minute Oxygen Saturation % 92 %     1 Minute Liters of Oxygen 0 L     2 Minute Oxygen Saturation % 86 %  stopped at 2:20 83%     2 Minute Liters of Oxygen 0 L     3 Minute Oxygen Saturation % 90 %  stopped at  2:55 81%     3 Minute Liters of Oxygen 0 L     4 Minute Oxygen Saturation % 91 %     4 Minute Liters of Oxygen 0 L     5 Minute Oxygen Saturation % 85 %  stopped at 4:59 leg fatigue (87%)     5 Minute Liters of Oxygen 0 L     6 Minute Oxygen Saturation % 88 %  stopped at 5:22 83%     6 Minute Liters of Oxygen 0 L     2 Minute Post Oxygen Saturation % 97 %     2 Minute Post Liters of Oxygen 0 L        Initial Exercise Prescription:   Perform Capillary Blood Glucose checks as needed.  Exercise Prescription Changes:     Exercise Prescription Changes    Row Name 07/17/16 1300 07/30/16 1500 07/31/16 1400 08/15/16 1000 08/28/16 1500     Exercise Review   Progression Yes Yes Yes Yes Yes     Response to Exercise   Blood Pressure (Admit)  -- 120/64  -- 124/70 118/66   Blood Pressure (Exercise) 118/60 158/82  -- 128/82 150/80   Blood Pressure (Exit) 120/70 100/60  -- 118/66 118/60   Heart Rate (Admit) 83 bpm 109 bpm  -- 104 bpm 89 bpm   Heart Rate (Exercise) 124 bpm 123 bpm  --  119 bpm 124 bpm   Heart Rate (Exit) 91 bpm 104 bpm  -- 100 bpm 106 bpm   Oxygen Saturation (Admit) 94 % 96 %  -- 94 % 97 %   Oxygen Saturation (Exercise) 94 % 91 %  -- 88 % 96 %   Oxygen Saturation (Exit) 95 % 94 %  -- 96 % 96 %   Rating of Perceived Exertion (Exercise) 14 13  -- 12 12   Perceived Dyspnea (Exercise) 4 3  -- 3 3   Symptoms  --  -- none none none   Comments  --  -- Home Exercise Guidelines given 07/31/16  -- Home Exercise Guidelines given 07/31/16   Duration   --  --  --  -- Progress to 45 minutes of aerobic exercise without signs/symptoms of physical distress   Intensity THRR unchanged THRR unchanged THRR unchanged THRR unchanged THRR unchanged     Progression   Progression Continue to progress workloads to maintain intensity without signs/symptoms of physical distress. Continue to progress workloads to maintain intensity without signs/symptoms of physical distress. Continue to progress workloads to maintain intensity without signs/symptoms of physical distress. Continue to progress workloads to maintain intensity without signs/symptoms of physical distress. Continue to progress workloads to maintain intensity without signs/symptoms of physical distress.   Average METs  --  --  --  -- 2.32     Resistance Training   Training Prescription Yes Yes Yes Yes Yes   Weight 3 3 3 3 3  lbs   Reps 10-12 10-12 10-12 10-15 10-12     Interval Training   Interval Training No No No No No     Treadmill   MPH 1.3 1.4 1.4 1.5 1.5   Grade 0 0 0 0.5 0.5   Minutes 15 15 15 15 15    METs  --  --  --  -- 2.25     NuStep   Level 3 3 3 4 3    Watts  --  --  --  -- --  2.7 METs   Minutes 15 15 15 15 15      T5 Nustep   Level  --  --  --  -- 4   Minutes  --  --  --  -- 15   METs  --  --  --  -- 2     Biostep-RELP   Level 3 3 3 3   --   Minutes 15 15 15 15   --   METs 2  --  --  --  --     Home Exercise Plan   Plans to continue exercise at  --  -- Home  walking  -- Home  walking   Frequency  --  -- Add 2 additional days to program exercise sessions.  -- Add 2 additional days to program exercise sessions.   Row Name 09/11/16 1600 09/25/16 1600 10/08/16 1500         Exercise Review   Progression Yes Yes Yes       Response to Exercise   Blood Pressure (Admit) 138/70 110/70 126/72     Blood Pressure (Exercise) 150/78 134/64 168/70     Blood Pressure (Exit) 104/64 126/70 116/60     Heart Rate (Admit) 90 bpm 85 bpm 78 bpm     Heart Rate (Exercise) 118 bpm  122 bpm 123 bpm     Heart Rate (Exit) 93 bpm 101 bpm 99 bpm     Oxygen Saturation (Admit) 97 % 95 %  95 %     Oxygen Saturation (Exercise) 92 % 91 % 88 %     Oxygen Saturation (Exit) 94 % 96 % 95 %     Rating of Perceived Exertion (Exercise) 13 13 13      Perceived Dyspnea (Exercise) 3 3 3      Symptoms none none none     Comments Home Exercise Guidelines given 07/31/16 Home Exercise Guidelines given 07/31/16 Home Exercise Guidelines given 07/31/16     Duration Progress to 45 minutes of aerobic exercise without signs/symptoms of physical distress Progress to 45 minutes of aerobic exercise without signs/symptoms of physical distress Progress to 45 minutes of aerobic exercise without signs/symptoms of physical distress     Intensity THRR unchanged THRR unchanged THRR unchanged       Progression   Progression Continue to progress workloads to maintain intensity without signs/symptoms of physical distress. Continue to progress workloads to maintain intensity without signs/symptoms of physical distress. Continue to progress workloads to maintain intensity without signs/symptoms of physical distress.     Average METs 2.32 2.5 2.62       Resistance Training   Training Prescription Yes Yes Yes     Weight 4 lbs 4 lbs 4 lbs     Reps 10-12 10-12 10-12       Interval Training   Interval Training No No No       Treadmill   MPH 1.5 1.8 1.8     Grade 0.5 1.5 1.5     Minutes 15 15 15      METs 2.25 2.75 2.75       NuStep   Level 3 4 4      Watts --  2.6 METs 74 spm --  3.0 METs --  3.1 METs     Minutes 15 15 15        Biostep-RELP   Level 4  -- 4     Watts --  42 spm  --  --     Minutes 15  -- 15     METs 2  -- 2       Home Exercise Plan   Plans to continue exercise at Home  walking Home  walking Home  walking     Frequency Add 2 additional days to program exercise sessions. Add 2 additional days to program exercise sessions. Add 2 additional days to program exercise sessions.         Exercise Comments:     Exercise Comments    Row Name 06/21/16 1020 07/04/16 1309 07/10/16 1444 07/17/16 1342 07/30/16 1552   Exercise Comments Ms Gaden has not attended since 04/19/16 Ms Bennick has not attended [facility] since 04/19/16. Alverda returned today without any problems. Porshea has done well her first 2 days back [facility]. Beva is progressing well with exercise.   [place] Name 07/31/16 1424 08/15/16 1048 08/28/16 1544 09/11/16 1611 09/25/16 1604   Exercise Comments Reviewed home exercise with pt today.  Pt plans to walk at home for exercise.  Reviewed THR, pulse, RPE, sign and symptoms, and when to call 911 or MD.  Also discussed weather considerations and indoor options.  Pt voiced understanding. Latroya is progressing well with exercise. Saphire returned today after being out for almost two weeks.  She was able to return without any problems.  We will continue to monitor for progression. Dwight continues to do well with exercise.  She only have 6 more visits!  We will continue to monitor her progression. Stanton Kidney  has 4 visits to go!  She is going to try to increase her incline on her next visit.  We will continue to monitor her progresison.   Row Name 10/08/16 1545           Exercise Comments Gabreilla has 2 more visits!  She is doing well with the extra incline.  We will continue to monitor her progression.          Discharge Exercise Prescription (Final Exercise Prescription Changes):     Exercise Prescription Changes - 10/08/16 1500      Exercise Review   Progression Yes     Response to Exercise   Blood Pressure (Admit) 126/72   Blood Pressure (Exercise) 168/70   Blood Pressure (Exit) 116/60   Heart Rate (Admit) 78 bpm   Heart Rate (Exercise) 123 bpm   Heart Rate (Exit) 99 bpm   Oxygen Saturation (Admit) 95 %   Oxygen Saturation (Exercise) 88 %   Oxygen Saturation (Exit) 95 %   Rating of Perceived Exertion (Exercise) 13   Perceived Dyspnea (Exercise) 3   Symptoms none   Comments  Home Exercise Guidelines given 07/31/16   Duration Progress to 45 minutes of aerobic exercise without signs/symptoms of physical distress   Intensity THRR unchanged     Progression   Progression Continue to progress workloads to maintain intensity without signs/symptoms of physical distress.   Average METs 2.62     Resistance Training   Training Prescription Yes   Weight 4 lbs   Reps 10-12     Interval Training   Interval Training No     Treadmill   MPH 1.8   Grade 1.5   Minutes 15   METs 2.75     NuStep   Level 4   Watts --  3.1 METs   Minutes 15     Biostep-RELP   Level 4   Minutes 15   METs 2     Home Exercise Plan   Plans to continue exercise at Home  walking   Frequency Add 2 additional days to program exercise sessions.       Nutrition:  Target Goals: Understanding of nutrition guidelines, daily intake of sodium 1500mg , cholesterol 200mg , calories 30% from fat and 7% or less from saturated fats, daily to have 5 or more servings of fruits and vegetables.  Biometrics:    Nutrition Therapy Plan and Nutrition Goals:   Nutrition Discharge: Rate Your Plate Scores:   Psychosocial: Target Goals: Acknowledge presence or absence of depression, maximize coping skills, provide positive support system. Participant is able to verbalize types and ability to use techniques and skills needed for reducing stress and depression.  Initial Review & Psychosocial Screening:   Quality of Life Scores:     Quality of Life - 09/04/16 1552      Quality of Life Scores   Health/Function Pre 19.6 %   Health/Function Post 20.8 %   Health/Function % Change 6.12 %   Socioeconomic Pre 27 %   Socioeconomic Post 20.67 %   Socioeconomic % Change  -23.44 %   Psych/Spiritual Pre 22.29 %   Psych/Spiritual Post 21 %   Psych/Spiritual % Change -5.79 %   Family Pre 19.5 %   Family Post 21 %   Family % Change 7.69 %   GLOBAL Pre 21.56 %   GLOBAL Post 20.84 %   GLOBAL %  Change -3.34 %      PHQ-9: Recent Review Flowsheet Data  Depression screen [facility] 2/9 09/04/2016 08/30/2016 01/09/2016   Decreased Interest 3 3 2    Down, Depressed, Hopeless 3 3 1    PHQ - 2 Score 6 6 3    Altered sleeping 3 3 2    Tired, decreased energy 3 3 2    Change in appetite 1 1 1    Feeling bad or failure about yourself  0 0 0   Trouble concentrating 0 0 0   Moving slowly or fidgety/restless 0 0 0   Suicidal thoughts 0 0 0   PHQ-9 Score 13 13 8    Difficult doing work/chores Not difficult at all Not difficult at all Not difficult at all      Psychosocial Evaluation and Intervention:     Psychosocial Evaluation - 12/11/16 0631      Discharge Psychosocial Assessment & Intervention   Comments Ms Lamantia has not been able to attend [facility] on a regular bases. Her last visit was 10/03/16. She knows the benefit of exercising for managing her COPD and is now ready to commit to [facility] and will attend 3 days/week.      Psychosocial Re-Evaluation:     Psychosocial Re-Evaluation    Row Name 06/24/16 [phone] 07/17/16 1631 08/14/16 1325 08/14/16 1330 09/16/16 1532     Psychosocial Re-Evaluation   Interventions  -- Encouraged to attend Pulmonary Rehabilitation for the exercise;Relaxation education;Stress management education Encouraged to attend Pulmonary Rehabilitation for the exercise   encouraged Eilley to talk to her PMD about her anxiety levels.  --  --   Comments Ms Vik has had medical issues and has not attended since 04/19/2016. IN talking with her several times, she states she is looking forward to returning to [place] for it has been so encouraging for her both mentally and physically. Dorien's psychosocial assessment reveals no barriers to participation in Pulmonary Rehab. Psychosocial areas that are currently affecting patient's rehab experience include concerns about treatment decisions, dealing with as evidenced by fear and worry about another syncopal episode occurring  without warning.Stanton Kidney does continue to exhibit positive coping skills to deal with her psychosocial concerns. Offered emotional support and reassurance. Kiyanna does feel she is making progress toward Pulmonary Rehab goals, even though she has been out for several months because of syncopal episodes and evaluation. Annalis reports her health and activity level HAS NOT improved in the past 30 days, since she has not been in the program for that time period.Marland Kitchenas evidenced by patient's report of unchanged ability to exercise. Patient reports feeling positive about current and projected progression in Pulmonary Rehab. After reviewing the patient's treatment plan, the patient is making progress toward Pulmonary Rehab goals. Patient's rate of progress toward rehab goals is good. Plan of action to help patient continue to work towards rehab goals include time with psychosocial counselor to review the fear of another episode and to look at coping mechanisms to help decrease fears. . Will continue to monitor and evaluate progress toward psychosocial goal(s). Patient psychosocial assessment reveals no barriers to participation in Pulmonary Rehab. Psychosocial areas that are currently affecting patient's rehab experience include concerns about treatment decisions, and emotional problems as evidenced by fear of another syncopal episode occurring. Patient does continue to exhibit positive coping skills to deal with her psychosocial concerns. Offered emotional support and reassurance. Patient does feel she is making progress toward Pulmonary Rehab goals. Patient reports her health and activity level has improved in the past 30 days as evidenced by patient's report of increasedability to complete her exercise prescription  and improved ability to manage daily activities.  Patient reports  feeling positive about current and projected progression in Pulmonary Rehab. After reviewing the patient's treatment plan, the patient is making  progress toward Pulmonary Rehab goals. Patient's rate of progress toward rehab goals is excellent. Plan of action to help patient continue to work towards rehab goals include decreasing fear/anxiety of another syncopal episode. Advised Emma to talk to her primary MD about her "shoulders rising up to her ears" because of her anxiety. Will also have psychosocial staff talk to [facility] for any resource/techniques to help reduce anxiety. Will continue to monitor and evaluate progress toward psychosocial goal(s). Brette did for first time since syncopal episodes take her grandchildren with her in her car.  Ms Stodola has been attending [facility] regularly and enjoys the exercise, plus the socialization. She has had some stressful episodes lately with her grandchildren and school bus pick-up, but has worked through it, since she knows how helpful her care of the kids is to their parents. After [facility], Ms Intriago plans to attend [facility] for the security of being monitored during exercise.    Continued Psychosocial Services Needed  -- Yes Yes  --  --   [place] Name 10/04/16 1249 11/05/16 1519           Psychosocial Re-Evaluation   Interventions Encouraged to attend Pulmonary Rehabilitation for the exercise Encouraged to attend Pulmonary Rehabilitation for the exercise      Comments [facility] psychosocial assessment reveals no barriers at this time to participation in Pulmonary Rehab.  She has good family and friend support with her 2 daughters that encourage Tayva to participate in [place] and progress with her goals.  Dunia's shortness of breath concerns are monitored, but she has acknowledged that attending the program has helped to maintain quality life with improved mobility, self-care, and emotional and financial stability.  Raphaelle is commended for regular attendance and self-motivation to improve her pulmonary disease management. Katenia has been out of rehab since 10/03/16.  She was initially having some nasuea, but no  barriers to coming since then.  She just has not attended.  She is going out of town for Christmas and hopes to return in January.      Continued Psychosocial Services Needed  -- Yes        Education: Education Goals: Education classes will be provided on a weekly basis, covering required topics. Participant will state understanding/return demonstration of topics presented.  Learning Barriers/Preferences:   Education Topics: Initial Evaluation Education: - Verbal, written and demonstration of respiratory meds, RPE/PD scales, oximetry and breathing techniques. Instruction on use of nebulizers and MDIs: cleaning and proper use, rinsing mouth with steroid doses and importance of monitoring MDI activations. Flowsheet Row Pulmonary Rehab from 10/02/2016 in [facility] Cardiac and Pulmonary Rehab  Date  01/09/16  Educator  LB  Instruction Review Code  2- meets goals/outcomes      General Nutrition Guidelines/Fats and Fiber: -Group instruction provided by verbal, written material, models and posters to present the general guidelines for heart healthy nutrition. Gives an explanation and review of dietary fats and fiber.   Controlling Sodium/Reading Food Labels: -Group verbal and written material supporting the discussion of sodium use in heart healthy nutrition. Review and explanation with models, verbal and written materials for utilization of the food label.   Exercise Physiology & Risk Factors: - Group verbal and written instruction with models to review the exercise physiology of the cardiovascular system and associated critical values. Details cardiovascular  disease risk factors and the goals associated with each risk factor. Flowsheet Row Pulmonary Rehab from 10/02/2016 in [facility] Cardiac and Pulmonary Rehab  Date  10/02/16  Educator  AS  Instruction Review Code  2- meets goals/outcomes      Aerobic Exercise & Resistance Training: - Gives group verbal and written discussion on the health  impact of inactivity. On the components of aerobic and resistive training programs and the benefits of this training and how to safely progress through these programs. Flowsheet Row Pulmonary Rehab from 10/02/2016 in [facility] Cardiac and Pulmonary Rehab  Date  08/07/16  Educator  JH/AS  Instruction Review Code  2- meets goals/outcomes      Flexibility, Balance, General Exercise Guidelines: - Provides group verbal and written instruction on the benefits of flexibility and balance training programs. Provides general exercise guidelines with specific guidelines to those with heart or lung disease. Demonstration and skill practice provided. Flowsheet Row Pulmonary Rehab from 10/02/2016 in [facility] Cardiac and Pulmonary Rehab  Date  03/27/16  Educator  Salley Hews, PT`  Instruction Review Code  2- meets goals/outcomes      Stress Management: - Provides group verbal and written instruction about the health risks of elevated stress, cause of high stress, and healthy ways to reduce stress.   Depression: - Provides group verbal and written instruction on the correlation between heart/lung disease and depressed mood, treatment options, and the stigmas associated with seeking treatment.   Exercise & Equipment Safety: - Individual verbal instruction and demonstration of equipment use and safety with use of the equipment.   Infection Prevention: - Provides verbal and written material to individual with discussion of infection control including proper hand washing and proper equipment cleaning during exercise session.   Falls Prevention: - Provides verbal and written material to individual with discussion of falls prevention and safety. Flowsheet Row Pulmonary Rehab from 10/02/2016 in [facility] Cardiac and Pulmonary Rehab  Date  01/09/16  Educator  LB  Instruction Review Code  2- meets goals/outcomes      Diabetes: - Individual verbal and written instruction to review signs/symptoms of diabetes,  desired ranges of glucose level fasting, after meals and with exercise. Advice that pre and post exercise glucose checks will be done for 3 sessions at entry of program.   Chronic Lung Diseases: - Group verbal and written instruction to review new updates, new respiratory medications, new advancements in procedures and treatments. Provide informative websites and "800" numbers of self-education.   Lung Procedures: - Group verbal and written instruction to describe testing methods done to diagnose lung disease. Review the outcome of test results. Describe the treatment choices: Pulmonary Function Tests, ABGs and oximetry. Flowsheet Row Pulmonary Rehab from 10/02/2016 in [facility] Cardiac and Pulmonary Rehab  Date  04/12/16  Educator  Hiltonia  Instruction Review Code  2- meets goals/outcomes      Energy Conservation: - Provide group verbal and written instruction for methods to conserve energy, plan and organize activities. Instruct on pacing techniques, use of adaptive equipment and posture/positioning to relieve shortness of breath.   Triggers: - Group verbal and written instruction to review types of environmental controls: home humidity, furnaces, filters, dust mite/pet prevention, HEPA vacuums. To discuss weather changes, air quality and the benefits of nasal washing. Flowsheet Row Pulmonary Rehab from 10/02/2016 in [facility] Cardiac and Pulmonary Rehab  Date  07/29/16  Educator  LB  Instruction Review Code  2- meets goals/outcomes      Exacerbations: - Group verbal and written  instruction to provide: warning signs, infection symptoms, calling MD promptly, preventive modes, and value of vaccinations. Review: effective airway clearance, coughing and/or vibration techniques. Create an Sports administrator.   Oxygen: - Individual and group verbal and written instruction on oxygen therapy. Includes supplement oxygen, available portable oxygen systems, continuous and intermittent flow rates, oxygen safety,  concentrators, and Medicare reimbursement for oxygen.   Respiratory Medications: - Group verbal and written instruction to review medications for lung disease. Drug class, frequency, complications, importance of spacers, rinsing mouth after steroid MDI's, and proper cleaning methods for nebulizers. Flowsheet Row Pulmonary Rehab from 10/02/2016 in [facility] Cardiac and Pulmonary Rehab  Date  01/09/16  Educator  LB  Instruction Review Code  2- meets goals/outcomes      AED/CPR: - Group verbal and written instruction with the use of models to demonstrate the basic use of the AED with the basic ABC's of resuscitation. Flowsheet Row Pulmonary Rehab from 10/02/2016 in [facility] Cardiac and Pulmonary Rehab  Date  04/05/16  Educator  CE  Instruction Review Code  2- meets goals/outcomes      Breathing Retraining: - Provides individuals verbal and written instruction on purpose, frequency, and proper technique of diaphragmatic breathing and pursed-lipped breathing. Applies individual practice skills. Flowsheet Row Pulmonary Rehab from 10/02/2016 in [facility] Cardiac and Pulmonary Rehab  Date  01/09/16  Educator  LB  Instruction Review Code  2- meets goals/outcomes      Anatomy and Physiology of the Lungs: - Group verbal and written instruction with the use of models to provide basic lung anatomy and physiology related to function, structure and complications of lung disease. Flowsheet Row Pulmonary Rehab from 10/02/2016 in [facility] Cardiac and Pulmonary Rehab  Date  07/12/16  Educator  [place]  Instruction Review Code  2- meets goals/outcomes      Heart Failure: - Group verbal and written instruction on the basics of heart failure: signs/symptoms, treatments, explanation of ejection fraction, enlarged heart and cardiomyopathy. Flowsheet Row Pulmonary Rehab from 10/02/2016 in [facility] Cardiac and Pulmonary Rehab  Date  07/19/16  Educator  [place]  Instruction Review Code  2- meets goals/outcomes      Sleep  Apnea: - Individual verbal and written instruction to review Obstructive Sleep Apnea. Review of risk factors, methods for diagnosing and types of masks and machines for OSA.   Anxiety: - Provides group, verbal and written instruction on the correlation between heart/lung disease and anxiety, treatment options, and management of anxiety.   Relaxation: - Provides group, verbal and written instruction about the benefits of relaxation for patients with heart/lung disease. Also provides patients with examples of relaxation techniques. Flowsheet Row Pulmonary Rehab from 10/02/2016 in [facility] Cardiac and Pulmonary Rehab  Date  04/17/16  Educator  Elissa Hefty  Instruction Review Code  2- Meets goals/outcomes      Knowledge Questionnaire Score:     Knowledge Questionnaire Score - 08/30/16 1336      Knowledge Questionnaire Score   Pre Score 10/10   Post Score 10/10       Core Components/Risk Factors/Patient Goals at Admission:   Core Components/Risk Factors/Patient Goals Review:      Goals and Risk Factor Review    Row Name 07/12/16 1455 08/02/16 1254 08/07/16 1305 08/14/16 1327 09/06/16 1225     Core Components/Risk Factors/Patient Goals Review   Personal Goals Review Hypertension;Sedentary;Increase Strength and Stamina Sedentary;Increase Strength and Stamina Hypertension Increase knowledge of respiratory medications and ability to use respiratory devices properly.;Improve shortness of breath with ADL's;Develop  more efficient breathing techniques such as purse lipped breathing and diaphragmatic breathing and practicing self-pacing with activity. Sedentary;Increase Strength and Stamina;Weight Management/Obesity;Improve shortness of breath with ADL's;Develop more efficient breathing techniques such as purse lipped breathing and diaphragmatic breathing and practicing self-pacing with activity.;Increase knowledge of respiratory medications and ability to use respiratory devices  properly.;Hypertension   Review Loeva has been out for 3 months and states it is hard starting back.  Bps have been good in [facility] classes. Cherub has not started walking at home. She is looking for her walker to be able to take with her for balance.  She is doing handweights and is planning to start a gait and balance program after graduating. Jackalyn's blood pressure was elevated slightly today at 130/70 but normal for her is 112/70. Veeda is doing well with her medication compliance and MDI use. She demonstrates great PLB technique while exercising.  She has stated that she has had to use her neulizer more often this week. She wil talk to her Pulmonary MD tomorrow about her nebulizer use. Seva has been doing well in rehab.  She is walking one extra day a week at home.  She is checking her blood pressure some at home and it has been in the 120/70s.  She is taking her medication in the morning and has noticed that it is higher in the evenings.  She is doing better with being able to do her ADLs with less SOB.  She is using her pursed lip breathing more regularly.  She mentioned that it still varies from day to day with her nebulizer use as dome days she needs it three times and others she good after just one treatment.     Expected Outcomes Zylpha will continue to improve strength and stamina with regular participation in [place]. Lanora will continue to come to exercise to improve stamina and start walking at home. Stable blood pressure. Continues compliance with her medications. Changes as physican orders for use of nebulizer, Continues use of PLB with her activities. Nadira will continue to improve by coming the class and using PLB.   [place] Name 10/04/16 1236 12/11/16 [id number]           Core Components/Risk Factors/Patient Goals Review   Personal Goals Review Sedentary;Increase Strength and Stamina;Weight Management/Obesity;Improve shortness of breath with ADL's;Develop more efficient breathing techniques such as  purse lipped breathing and diaphragmatic breathing and practicing self-pacing with activity.;Increase knowledge of respiratory medications and ability to use respiratory devices properly.;Hypertension  --      Review Marietta states she is feeling better overall, with improved strength and stamina.  She has gained 5lbs (116lbs up to 121lbs) since starting the program.  Her shortness of breath is about the same, having good and bad days.  However, using pursed lip breathing helps on the bad days.  She denies any problems with her medications.  Her resting blood pressure is good, but does increase with exercise.  She notes she has been able to increase her the intensity on machines in class.   Ms Langill has been out since11/16/18. She improved her stamina and energy levels with participation in [place], so she definitely plans to continue her exercise in the [employer] program.      Expected Outcomes Dorethy is set to graduate next week and plans to go to [employer].   Continue exercise in [employer] and continue managing her COPD with the knowledge she has gain through [place].  Core Components/Risk Factors/Patient Goals at Discharge (Final Review):      Goals and Risk Factor Review - 12/11/16 0627      Core Components/Risk Factors/Patient Goals Review   Review Ms Drennen has been out since11/16/18. She improved her stamina and energy levels with participation in [place], so she definitely plans to continue her exercise in the [employer].   Expected Outcomes Continue exercise in [employer] and continue managing her COPD with the knowledge she has gain through [place].      ITP Comments:     ITP Comments    Row Name 06/21/16 1021 07/04/16 1309 08/15/16 1049 10/22/16 1548 11/05/16 1519   ITP Comments Ms Brengle has not attended since 04/19/16 Ms Roether has not attended [facility] since 04/19/16. Shewanda is progressing well with exercise. Called Ms Tassey - last attended 10/03/16.  She had a busy Thanksgiving and last week had a migraine with nausea. Ms Panzica hopes to return this Friday. Called to check on status of return.  She has not been coming.  She hopes to return in January.  She still wants to finish her last two visits.   Row Name 12/02/16 0816 12/11/16 0626         ITP Comments Called Ms Hackert on 11/25/16 and 11/27/16 - no answer and unable to leave a messaage. Called Ms Mooring. She last attended 10/03/16. Since she has only 2 sessions of [facility] left to finish, she will be discharged from [place] and started in [employer] for 3 days/week.         Comments: Called Ms Eichstadt. She last attended 10/03/16. Since she has only 2 sessions of [facility] left to finish, she will be discharged from [place] and started in [employer] for 3 days/week.

## 2016-12-11 NOTE — Progress Notes (Signed)
Discharge Summary  Patient Details  Name: Jessica Bowman MRN: [id number] Date of Birth: 1941-05-04 Referring Provider:   Arn Medal Row Documentation from 04/29/2016 in [facility] Cardiac and Pulmonary Rehab  Referring Provider  Dr. Raul Del       Number of Visits: 34  Reason for Discharge:  Early Exit:  Called Jessica Bowman. She last attended 10/03/16. Since she has only 2 sessions of [facility] left to finish, she will be discharged from [place] and started in [employer] for 3 days/week.  Smoking History:  History  Smoking Status   Former Smoker   Quit date: 04/20/2012  Smokeless Tobacco   Not on file    Diagnosis:  COPD, moderate ([place])  ADL UCSD:     Pulmonary Assessment Scores    Row Name 08/30/16 1337         ADL UCSD   ADL Phase Exit     SOB Score total 58     Rest 0     Walk 3     Stairs 4     Bath 1     Dress 1     Shop 1        Initial Exercise Prescription:   Discharge Exercise Prescription (Final Exercise Prescription Changes):     Exercise Prescription Changes - 10/08/16 1500      Exercise Review   Progression Yes     Response to Exercise   Blood Pressure (Admit) 126/72   Blood Pressure (Exercise) 168/70   Blood Pressure (Exit) 116/60   Heart Rate (Admit) 78 bpm   Heart Rate (Exercise) 123 bpm   Heart Rate (Exit) 99 bpm   Oxygen Saturation (Admit) 95 %   Oxygen Saturation (Exercise) 88 %   Oxygen Saturation (Exit) 95 %   Rating of Perceived Exertion (Exercise) 13   Perceived Dyspnea (Exercise) 3   Symptoms none   Comments Home Exercise Guidelines given 07/31/16   Duration Progress to 45 minutes of aerobic exercise without signs/symptoms of physical distress   Intensity THRR unchanged     Progression   Progression Continue to progress workloads to maintain intensity without signs/symptoms of physical distress.   Average METs 2.62     Resistance Training   Training Prescription Yes   Weight 4 lbs   Reps 10-12     Interval Training    Interval Training No     Treadmill   MPH 1.8   Grade 1.5   Minutes 15   METs 2.75     NuStep   Level 4   Watts --  3.1 METs   Minutes 15     Biostep-RELP   Level 4   Minutes 15   METs 2     Home Exercise Plan   Plans to continue exercise at Home  walking   Frequency Add 2 additional days to program exercise sessions.      Functional Capacity:     6 Minute Walk    Row Name 09/20/16 1221         6 Minute Walk   Phase Discharge     Distance 1040 feet     Distance % Change -9.9 %  decreased 115 ft     Walk Time 5.32 minutes     # of Rest Breaks 4  9 sec, 9 sec, 13 sec, 10 sec     MPH 2.22     METS 2.56     RPE 14     Perceived Dyspnea  4  VO2 Peak 8.97     Symptoms No     Resting HR 88 bpm     Resting BP 110/70     Max Ex. HR 121 bpm     Max Ex. BP 134/64     2 Minute Post BP 132/70       Interval HR   Baseline HR 88     1 Minute HR 108     2 Minute HR 109     3 Minute HR 111     4 Minute HR 110     5 Minute HR 114     6 Minute HR 121     2 Minute Post HR 109     Interval Heart Rate? Yes       Interval Oxygen   Interval Oxygen? Yes     Baseline Oxygen Saturation % 95 %     Baseline Liters of Oxygen 0 L  Room Air     1 Minute Oxygen Saturation % 92 %     1 Minute Liters of Oxygen 0 L     2 Minute Oxygen Saturation % 86 %  stopped at 2:20 83%     2 Minute Liters of Oxygen 0 L     3 Minute Oxygen Saturation % 90 %  stopped at  2:55 81%     3 Minute Liters of Oxygen 0 L     4 Minute Oxygen Saturation % 91 %     4 Minute Liters of Oxygen 0 L     5 Minute Oxygen Saturation % 85 %  stopped at 4:59 leg fatigue (87%)     5 Minute Liters of Oxygen 0 L     6 Minute Oxygen Saturation % 88 %  stopped at 5:22 83%     6 Minute Liters of Oxygen 0 L     2 Minute Post Oxygen Saturation % 97 %     2 Minute Post Liters of Oxygen 0 L        Psychological, QOL, Others - Outcomes: PHQ 2/9: Depression screen [facility] 2/9 09/04/2016 08/30/2016 01/09/2016   Decreased Interest 3 3 2   Down, Depressed, Hopeless 3 3 1   PHQ - 2 Score 6 6 3   Altered sleeping 3 3 2   Tired, decreased energy 3 3 2   Change in appetite 1 1 1   Feeling bad or failure about yourself  0 0 0  Trouble concentrating 0 0 0  Moving slowly or fidgety/restless 0 0 0  Suicidal thoughts 0 0 0  PHQ-9 Score 13 13 8   Difficult doing work/chores Not difficult at all Not difficult at all Not difficult at all    Quality of Life:     Quality of Life - 09/04/16 1552      Quality of Life Scores   Health/Function Pre 19.6 %   Health/Function Post 20.8 %   Health/Function % Change 6.12 %   Socioeconomic Pre 27 %   Socioeconomic Post 20.67 %   Socioeconomic % Change  -23.44 %   Psych/Spiritual Pre 22.29 %   Psych/Spiritual Post 21 %   Psych/Spiritual % Change -5.79 %   Family Pre 19.5 %   Family Post 21 %   Family % Change 7.69 %   GLOBAL Pre 21.56 %   GLOBAL Post 20.84 %   GLOBAL % Change -3.34 %      Personal Goals: Goals established at orientation with interventions provided to work toward goal.  Personal Goals Discharge:     Goals and Risk Factor Review    Row Name 07/12/16 1455 08/02/16 1254 08/07/16 1305 08/14/16 1327 09/06/16 1225     Core Components/Risk Factors/Patient Goals Review   Personal Goals Review Hypertension;Sedentary;Increase Strength and Stamina Sedentary;Increase Strength and Stamina Hypertension Increase knowledge of respiratory medications and ability to use respiratory devices properly.;Improve shortness of breath with ADL's;Develop more efficient breathing techniques such as purse lipped breathing and diaphragmatic breathing and practicing self-pacing with activity. Sedentary;Increase Strength and Stamina;Weight Management/Obesity;Improve shortness of breath with ADL's;Develop more efficient breathing techniques such as purse lipped breathing and diaphragmatic breathing and practicing self-pacing with activity.;Increase knowledge of  respiratory medications and ability to use respiratory devices properly.;Hypertension   Review Jessica Bowman has been out for 3 months and states it is hard starting back.  Bps have been good in [facility] classes. Jessica Bowman has not started walking at home. She is looking for her walker to be able to take with her for balance.  She is doing handweights and is planning to start a gait and balance program after graduating. Jessica Bowman's blood pressure was elevated slightly today at 130/70 but normal for her is 112/70. Jessica Bowman is doing well with her medication compliance and MDI use. She demonstrates great PLB technique while exercising.  She has stated that she has had to use her neulizer more often this week. She wil talk to her Pulmonary MD tomorrow about her nebulizer use. Jessica Bowman has been doing well in rehab.  She is walking one extra day a week at home.  She is checking her blood pressure some at home and it has been in the 120/70s.  She is taking her medication in the morning and has noticed that it is higher in the evenings.  She is doing better with being able to do her ADLs with less SOB.  She is using her pursed lip breathing more regularly.  She mentioned that it still varies from day to day with her nebulizer use as dome days she needs it three times and others she good after just one treatment.     Expected Outcomes Jessica Bowman will continue to improve strength and stamina with regular participation in [place]. Jessica Bowman will continue to come to exercise to improve stamina and start walking at home. Stable blood pressure. Continues compliance with her medications. Changes as physican orders for use of nebulizer, Continues use of PLB with her activities. Jessica Bowman will continue to improve by coming the class and using PLB.   Jessica Bowman Name 10/04/16 1236 12/11/16 [id number]           Core Components/Risk Factors/Patient Goals Review   Personal Goals Review Sedentary;Increase Strength and Stamina;Weight Management/Obesity;Improve shortness of breath  with ADL's;Develop more efficient breathing techniques such as purse lipped breathing and diaphragmatic breathing and practicing self-pacing with activity.;Increase knowledge of respiratory medications and ability to use respiratory devices properly.;Hypertension  --      Review Jessica Bowman states she is feeling better overall, with improved strength and stamina.  She has gained 5lbs (116lbs up to 121lbs) since starting the program.  Her shortness of breath is about the same, having good and bad days.  However, using pursed lip breathing helps on the bad days.  She denies any problems with her medications.  Her resting blood pressure is good, but does increase with exercise.  She notes she has been able to increase her the intensity on machines in class.   Jessica Bowman has been out since11/16/18. She improved her stamina  and energy levels with participation in [place], so she definitely plans to continue her exercise in the [employer] program.      Expected Outcomes Jessica Bowman is set to graduate next week and plans to go to [employer].   Continue exercise in [employer] and continue managing her COPD with the knowledge she has gain through [place].         Nutrition & Weight - Outcomes:    Nutrition:   Nutrition Discharge:   Education Questionnaire Score:     Knowledge Questionnaire Score - 08/30/16 1336      Knowledge Questionnaire Score   Pre Score 10/10   Post Score 10/10      Goals reviewed with patient; copy given to patient.

## 2017-01-21 NOTE — Telephone Encounter (Signed)
Called Jessica Bowman - last attended 10/03/16. She had a busy Thanksgiving and last week had a migraine with nausea. Jessica Bowman hopes to return this Friday.    By Karie Fetch

## 2017-03-18 ENCOUNTER — Other Ambulatory Visit: Payer: Self-pay | Admitting: Internal Medicine

## 2017-03-18 DIAGNOSIS — Z1231 Encounter for screening mammogram for malignant neoplasm of breast: Secondary | ICD-10-CM

## 2017-04-16 ENCOUNTER — Ambulatory Visit
Admission: RE | Admit: 2017-04-16 | Discharge: 2017-04-16 | Disposition: A | Discharge status: Home / Self Care | Payer: Medicare Other | Source: Ambulatory Visit | Attending: Internal Medicine | Admitting: Internal Medicine

## 2017-04-16 DIAGNOSIS — N6489 Other specified disorders of breast: Secondary | ICD-10-CM | POA: Insufficient documentation

## 2017-04-16 DIAGNOSIS — R928 Other abnormal and inconclusive findings on diagnostic imaging of breast: Secondary | ICD-10-CM | POA: Insufficient documentation

## 2017-04-16 DIAGNOSIS — Z1231 Encounter for screening mammogram for malignant neoplasm of breast: Secondary | ICD-10-CM | POA: Diagnosis present

## 2017-04-16 HISTORY — DX: Malignant (primary) neoplasm, unspecified: C80.1

## 2017-04-23 ENCOUNTER — Other Ambulatory Visit: Payer: Self-pay | Admitting: Internal Medicine

## 2017-04-23 DIAGNOSIS — R928 Other abnormal and inconclusive findings on diagnostic imaging of breast: Secondary | ICD-10-CM

## 2017-04-23 DIAGNOSIS — N6489 Other specified disorders of breast: Secondary | ICD-10-CM

## 2017-04-28 ENCOUNTER — Ambulatory Visit
Admission: RE | Admit: 2017-04-28 | Discharge: 2017-04-28 | Disposition: A | Discharge status: Home / Self Care | Payer: Medicare Other | Source: Ambulatory Visit | Attending: Internal Medicine | Admitting: Internal Medicine

## 2017-04-28 DIAGNOSIS — R928 Other abnormal and inconclusive findings on diagnostic imaging of breast: Secondary | ICD-10-CM | POA: Diagnosis not present

## 2017-04-28 DIAGNOSIS — N6489 Other specified disorders of breast: Secondary | ICD-10-CM

## 2017-05-08 NOTE — Discharge Instructions (Signed)

## 2017-05-14 ENCOUNTER — Encounter
Admission: RE | Disposition: A | Discharge status: Home / Self Care | Payer: Self-pay | Source: Ambulatory Visit | Attending: Ophthalmology

## 2017-05-14 ENCOUNTER — Ambulatory Visit
Admission: RE | Admit: 2017-05-14 | Discharge: 2017-05-14 | Disposition: A | Discharge status: Home / Self Care | Payer: Medicare Other | Source: Ambulatory Visit | Attending: Ophthalmology | Admitting: Ophthalmology

## 2017-05-14 ENCOUNTER — Ambulatory Visit: Payer: Medicare Other | Admitting: Anesthesiology

## 2017-05-14 DIAGNOSIS — Z8719 Personal history of other diseases of the digestive system: Secondary | ICD-10-CM | POA: Diagnosis not present

## 2017-05-14 DIAGNOSIS — L409 Psoriasis, unspecified: Secondary | ICD-10-CM | POA: Insufficient documentation

## 2017-05-14 DIAGNOSIS — F419 Anxiety disorder, unspecified: Secondary | ICD-10-CM | POA: Insufficient documentation

## 2017-05-14 DIAGNOSIS — M810 Age-related osteoporosis without current pathological fracture: Secondary | ICD-10-CM | POA: Diagnosis not present

## 2017-05-14 DIAGNOSIS — E7800 Pure hypercholesterolemia, unspecified: Secondary | ICD-10-CM | POA: Diagnosis not present

## 2017-05-14 DIAGNOSIS — K219 Gastro-esophageal reflux disease without esophagitis: Secondary | ICD-10-CM | POA: Diagnosis not present

## 2017-05-14 DIAGNOSIS — Z87891 Personal history of nicotine dependence: Secondary | ICD-10-CM | POA: Diagnosis not present

## 2017-05-14 DIAGNOSIS — R251 Tremor, unspecified: Secondary | ICD-10-CM | POA: Insufficient documentation

## 2017-05-14 DIAGNOSIS — H2511 Age-related nuclear cataract, right eye: Secondary | ICD-10-CM | POA: Diagnosis not present

## 2017-05-14 DIAGNOSIS — I10 Essential (primary) hypertension: Secondary | ICD-10-CM | POA: Diagnosis not present

## 2017-05-14 DIAGNOSIS — Z888 Allergy status to other drugs, medicaments and biological substances status: Secondary | ICD-10-CM | POA: Diagnosis not present

## 2017-05-14 DIAGNOSIS — Z85828 Personal history of other malignant neoplasm of skin: Secondary | ICD-10-CM | POA: Diagnosis not present

## 2017-05-14 DIAGNOSIS — E559 Vitamin D deficiency, unspecified: Secondary | ICD-10-CM | POA: Insufficient documentation

## 2017-05-14 DIAGNOSIS — K589 Irritable bowel syndrome without diarrhea: Secondary | ICD-10-CM | POA: Insufficient documentation

## 2017-05-14 DIAGNOSIS — Z880 Allergy status to penicillin: Secondary | ICD-10-CM | POA: Diagnosis not present

## 2017-05-14 DIAGNOSIS — J449 Chronic obstructive pulmonary disease, unspecified: Secondary | ICD-10-CM | POA: Diagnosis not present

## 2017-05-14 HISTORY — DX: Diverticulosis of intestine, part unspecified, without perforation or abscess without bleeding: K57.90

## 2017-05-14 HISTORY — DX: Tremor, unspecified: R25.1

## 2017-05-14 HISTORY — DX: Dyspnea, unspecified: R06.00

## 2017-05-14 HISTORY — DX: Unspecified malignant neoplasm of skin of nose: C44.301

## 2017-05-14 HISTORY — DX: Migraine, unspecified, not intractable, without status migrainosus: G43.909

## 2017-05-14 HISTORY — DX: Anxiety disorder, unspecified: F41.9

## 2017-05-14 HISTORY — DX: Diffuse cystic mastopathy of unspecified breast: N60.19

## 2017-05-14 HISTORY — DX: Irritable bowel syndrome, unspecified: K58.9

## 2017-05-14 HISTORY — DX: Psoriasis, unspecified: L40.9

## 2017-05-14 HISTORY — DX: Vitamin D deficiency, unspecified: E55.9

## 2017-05-14 HISTORY — DX: Pure hypercholesterolemia, unspecified: E78.00

## 2017-05-14 HISTORY — DX: Gastro-esophageal reflux disease without esophagitis: K21.9

## 2017-05-14 HISTORY — PX: CATARACT EXTRACTION W/PHACO: SHX586

## 2017-05-14 SURGERY — PHACOEMULSIFICATION, CATARACT, WITH IOL INSERTION
Anesthesia: Monitor Anesthesia Care | Laterality: Right | Wound class: Clean

## 2017-05-14 MED ORDER — LACTATED RINGERS IV SOLN: 1000.0000 mL | INTRAVENOUS | Status: DC

## 2017-05-14 MED ORDER — NA HYALUR & NA CHOND-NA HYALUR 0.4-0.35 ML IO KIT
PACK | INTRAOCULAR | Status: DC | PRN
  Administered 2017-05-14: 1 mL via INTRAOCULAR

## 2017-05-14 MED ORDER — MOXIFLOXACIN HCL 0.5 % OP SOLN
1.0000 [drp] | OPHTHALMIC | Status: DC | PRN
  Administered 2017-05-14 (×3): 1 [drp] via OPHTHALMIC

## 2017-05-14 MED ORDER — MOXIFLOXACIN HCL 0.5 % OP SOLN
OPHTHALMIC | Status: DC | PRN
  Administered 2017-05-14: .2 mL via OPHTHALMIC

## 2017-05-14 MED ORDER — BRIMONIDINE TARTRATE-TIMOLOL 0.2-0.5 % OP SOLN
OPHTHALMIC | Status: DC | PRN
  Administered 2017-05-14: 1 [drp] via OPHTHALMIC

## 2017-05-14 MED ORDER — ARMC OPHTHALMIC DILATING DROPS
1.0000 "application " | OPHTHALMIC | Status: DC | PRN
  Administered 2017-05-14 (×3): 1 via OPHTHALMIC

## 2017-05-14 MED ORDER — EPINEPHRINE PF 1 MG/ML IJ SOLN
INTRAOCULAR | Status: DC | PRN
  Administered 2017-05-14: 62 mL via OPHTHALMIC

## 2017-05-14 MED ORDER — MIDAZOLAM HCL 2 MG/2ML IJ SOLN
INTRAMUSCULAR | Status: DC | PRN
  Administered 2017-05-14: 2 mg via INTRAVENOUS

## 2017-05-14 MED ORDER — ONDANSETRON HCL 4 MG/2ML IJ SOLN
4.0000 mg | Freq: Once | INTRAMUSCULAR | Status: AC
  Administered 2017-05-14: 4 mg via INTRAVENOUS

## 2017-05-14 MED ORDER — LIDOCAINE HCL (PF) 2 % IJ SOLN
INTRAOCULAR | Status: DC | PRN
  Administered 2017-05-14: 1 mL via INTRAOCULAR

## 2017-05-14 MED ORDER — FENTANYL CITRATE (PF) 100 MCG/2ML IJ SOLN
INTRAMUSCULAR | Status: DC | PRN
  Administered 2017-05-14 (×2): 50 ug via INTRAVENOUS

## 2017-05-14 SURGICAL SUPPLY — 26 items
CANNULA ANT/CHMB 27GA (MISCELLANEOUS) ×3 IMPLANT
CARTRIDGE ABBOTT (MISCELLANEOUS) IMPLANT
GLOVE SURG LX 7.5 STRW (GLOVE) ×2
GLOVE SURG LX STRL 7.5 STRW (GLOVE) ×1 IMPLANT
GLOVE SURG TRIUMPH 8.0 PF LTX (GLOVE) ×3 IMPLANT
GOWN STRL REUS W/ TWL LRG LVL3 (GOWN DISPOSABLE) ×2 IMPLANT
GOWN STRL REUS W/TWL LRG LVL3 (GOWN DISPOSABLE) ×4
LENS IOL ACRSF IQ ULTRA 18.0 (Intraocular Lens) ×1 IMPLANT
LENS IOL ACRYSOF IQ 18.0 (Intraocular Lens) ×3 IMPLANT
MARKER SKIN DUAL TIP RULER LAB (MISCELLANEOUS) ×3 IMPLANT
NDL RETROBULBAR .5 NSTRL (NEEDLE) IMPLANT
NEEDLE FILTER BLUNT 18X 1/2SAF (NEEDLE) ×2
NEEDLE FILTER BLUNT 18X1 1/2 (NEEDLE) ×1 IMPLANT
PACK CATARACT BRASINGTON (MISCELLANEOUS) ×3 IMPLANT
PACK EYE AFTER SURG (MISCELLANEOUS) ×3 IMPLANT
PACK OPTHALMIC (MISCELLANEOUS) ×3 IMPLANT
RING MALYGIN 7.0 (MISCELLANEOUS) IMPLANT
SUT ETHILON 10-0 CS-B-6CS-B-6 (SUTURE)
SUT VICRYL  9 0 (SUTURE)
SUT VICRYL 9 0 (SUTURE) IMPLANT
SUTURE EHLN 10-0 CS-B-6CS-B-6 (SUTURE) IMPLANT
SYR 3ML LL SCALE MARK (SYRINGE) ×3 IMPLANT
SYR 5ML LL (SYRINGE) ×3 IMPLANT
SYR TB 1ML LUER SLIP (SYRINGE) ×3 IMPLANT
WATER STERILE IRR 250ML POUR (IV SOLUTION) ×3 IMPLANT
WIPE NON LINTING 3.25X3.25 (MISCELLANEOUS) ×3 IMPLANT

## 2017-05-14 NOTE — Transfer of Care (Signed)
Immediate Anesthesia Transfer of Care Note  Patient: Jessica Bowman  Procedure(s) Performed: Procedure(s): CATARACT EXTRACTION PHACO AND INTRAOCULAR LENS PLACEMENT (IOC)  Right (Right)  Patient Location: PACU  Anesthesia Type: MAC  Level of Consciousness: awake, alert  and patient cooperative  Airway and Oxygen Therapy: Patient Spontanous Breathing and Patient connected to supplemental oxygen  Post-op Assessment: Post-op Vital signs reviewed, Patient's Cardiovascular Status Stable, Respiratory Function Stable, Patent Airway and No signs of Nausea or vomiting  Post-op Vital Signs: Reviewed and stable  Complications: No apparent anesthesia complications

## 2017-05-14 NOTE — Op Note (Signed)
LOCATION:  [place]   PREOPERATIVE DIAGNOSIS:    Nuclear sclerotic cataract right eye. H25.11   POSTOPERATIVE DIAGNOSIS:  Nuclear sclerotic cataract right eye.     PROCEDURE:  Phacoemusification with posterior chamber intraocular lens placement of the right eye   LENS:   Implant Name Type Inv. Item Serial No. Manufacturer Lot No. LRB No. Used  AUooto     [id number] ALCON   Right 1     18.0 D [id number]   ULTRASOUND TIME: 16 % of 1 minutes, 13 seconds.  CDE 11.8   SURGEON:  Wyonia Hough, MD   ANESTHESIA:  Topical with tetracaine drops and 2% Xylocaine jelly, augmented with 1% preservative-free intracameral lidocaine.    COMPLICATIONS:  None.   DESCRIPTION OF PROCEDURE:  The patient was identified in the holding room and transported to the operating room and placed in the supine position under the operating microscope.  The right eye was identified as the operative eye and it was prepped and draped in the usual sterile ophthalmic fashion.   A 1 millimeter clear-corneal paracentesis was made at the 12:00 position.  0.5 ml of preservative-free 1% lidocaine was injected into the anterior chamber. The anterior chamber was filled with Viscoat viscoelastic.  A 2.4 millimeter keratome was used to make a near-clear corneal incision at the 9:00 position.  A curvilinear capsulorrhexis was made with a cystotome and capsulorrhexis forceps.  Balanced salt solution was used to hydrodissect and hydrodelineate the nucleus.   Phacoemulsification was then used in stop and chop fashion to remove the lens nucleus and epinucleus.  The remaining cortex was then removed using the irrigation and aspiration handpiece. Provisc was then placed into the capsular bag to distend it for lens placement.  A lens was then injected into the capsular bag.  The remaining viscoelastic was aspirated.   Wounds were hydrated with balanced salt solution.  The anterior chamber was inflated to a physiologic  pressure with balanced salt solution.  No wound leaks were noted. Vigamox 0.2 ml of a 1mg  per ml solution was injected into the anterior chamber for a dose of 0.2 mg of intracameral antibiotic at the completion of the case.   Timolol and Brimonidine drops were applied to the eye.  The patient was taken to the recovery room in stable condition without complications of anesthesia or surgery.   Marylee Belzer 05/14/2017, 11:14 AM

## 2017-05-14 NOTE — Anesthesia Postprocedure Evaluation (Signed)
Anesthesia Post Note  Patient: Jessica Bowman  Procedure(s) Performed: Procedure(s) (LRB): CATARACT EXTRACTION PHACO AND INTRAOCULAR LENS PLACEMENT (IOC)  Right (Right)  Patient location during evaluation: PACU Anesthesia Type: MAC Level of consciousness: awake Pain management: pain level controlled Vital Signs Assessment: post-procedure vital signs reviewed and stable Respiratory status: spontaneous breathing Cardiovascular status: blood pressure returned to baseline Postop Assessment: no headache Anesthetic complications: no    Lavonna Monarch

## 2017-05-14 NOTE — Anesthesia Procedure Notes (Signed)
Procedure Name: MAC Performed by: Mayme Genta Pre-anesthesia Checklist: Patient identified, Emergency Drugs available, Suction available, Timeout performed and Patient being monitored Patient Re-evaluated:Patient Re-evaluated prior to inductionOxygen Delivery Method: Nasal cannula Placement Confirmation: positive ETCO2

## 2017-05-14 NOTE — Addendum Note (Signed)
Addendum  created 05/14/17 1123 by Lavonna Monarch, MD   Order list changed

## 2017-05-14 NOTE — Anesthesia Preprocedure Evaluation (Addendum)
Anesthesia Evaluation  Patient identified by MRN, date of birth, ID band Patient awake    Reviewed: Allergy & Precautions, NPO status , Patient's Chart, lab work & pertinent test results, reviewed documented beta blocker date and time   Airway Mallampati: II  TM Distance: >3 FB Neck ROM: Full    Dental no notable dental hx.    Pulmonary COPD, former smoker,    Pulmonary exam normal breath sounds clear to auscultation       Cardiovascular hypertension, Normal cardiovascular exam Rhythm:Regular Rate:Normal  Normal stress test 2017   Neuro/Psych  Headaches, Anxiety    GI/Hepatic Neg liver ROS, GERD  ,  Endo/Other  negative endocrine ROS  Renal/GU negative Renal ROS  negative genitourinary   Musculoskeletal negative musculoskeletal ROS (+)   Abdominal Normal abdominal exam  (+)  Abdomen: soft.    Peds  Hematology negative hematology ROS (+)   Anesthesia Other Findings   Reproductive/Obstetrics negative OB ROS                            Anesthesia Physical Anesthesia Plan  ASA: III  Anesthesia Plan: MAC   Post-op Pain Management:    Induction:   PONV Risk Score and Plan:   Airway Management Planned: Nasal Cannula  Additional Equipment: None  Intra-op Plan:   Post-operative Plan:   Informed Consent: I have reviewed the patients History and Physical, chart, labs and discussed the procedure including the risks, benefits and alternatives for the proposed anesthesia with the patient or authorized representative who has indicated his/her understanding and acceptance.     Plan Discussed with: CRNA, Anesthesiologist and Surgeon  Anesthesia Plan Comments:        Anesthesia Quick Evaluation

## 2017-05-14 NOTE — H&P (Signed)
The History and Physical notes are on paper, have been signed, and are to be scanned. The patient remains stable and unchanged from the H&P.   Previous H&P reviewed, patient examined, and there are no changes.  [name] 05/14/2017 9:54 AM\

## 2017-05-15 ENCOUNTER — Encounter: Payer: Self-pay | Admitting: Ophthalmology

## 2017-09-30 ENCOUNTER — Encounter: Payer: Self-pay | Admitting: Physical Therapy

## 2017-09-30 ENCOUNTER — Ambulatory Visit: Payer: Medicare Other | Attending: Internal Medicine | Admitting: Physical Therapy

## 2017-09-30 DIAGNOSIS — R2681 Unsteadiness on feet: Secondary | ICD-10-CM

## 2017-09-30 DIAGNOSIS — M6281 Muscle weakness (generalized): Secondary | ICD-10-CM

## 2017-09-30 DIAGNOSIS — R262 Difficulty in walking, not elsewhere classified: Secondary | ICD-10-CM | POA: Diagnosis present

## 2017-09-30 NOTE — Therapy (Signed)
[facility] [address] [place], [place], [address] Phone: [phone]   Fax:  [phone]  Physical Therapy Evaluation  Patient Details  Name: Jessica Bowman MRN: [id number] Date of Birth: [birth year] Referring Provider: Dr. Raechel Ache   Encounter Date: 09/30/2017  PT End of Session - 09/30/17 1736    Visit Number  1    Number of Visits  8    Date for PT Re-Evaluation  10/28/17    Authorization Type  G-Codes    PT Start Time  1431    PT Stop Time  1517    PT Time Calculation (min)  46 min    Equipment Utilized During Treatment  Gait belt    Activity Tolerance  Patient tolerated treatment well;Patient limited by fatigue;No increased pain    Behavior During Therapy  WFL for tasks assessed/performed       Past Medical History:  Diagnosis Date  . Anxiety   . Cancer ([place])    skin ca on nose  . COPD (chronic obstructive pulmonary disease) ([place])    exacerbation 04/16/2017  . Diverticulosis   . Dyspnea    with exertion  . Fibrocystic breast disease   . GERD (gastroesophageal reflux disease)   . Glaucoma   . Hypercholesterolemia   . Hypertension   . IBS (irritable bowel syndrome)   . Migraines   . Osteoporosis   . Psoriasis   . Skin cancer of nose   . 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 Vitamin D deficiency     Past Surgical History:  Procedure Laterality Date  . APPENDECTOMY    . APPENDECTOMY    . BREAST BIOPSY Left    neg  . BREAST BIOPSY Right    neg  . CEREBRAL ANEURYSM REPAIR     aneurysm repair  . TONSILLECTOMY      There were no vitals filed for this visit.   Subjective Assessment - 09/30/17 1443    Subjective  No pain at this time, no recent falls. Pt reports she has had decreased motivation to exercise or be active lately.     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 Pt reports that balance and deconditioning has become an issues over  the last few years since her husband died. Pt reports no falls recently. Medical Hx significant for HTN, COPD, and osteoporosis. Pt had visual deficits with diplopia following removal of an uninterrupted aneurysm in 2011.  Pt has "fainted" several times in the past, most recently in 2017. She hit her head, but did not seek medical treatment.    Patient Stated Goals  To feel more confident about balance/ decreased fear of falling    Currently in Pain?  No/denies    Multiple Pain Sites  No          PROM/AROM:  Upper Extremity: WFL  Lower extremity: WFL (hypermobile hips with subluxation on R with max hip flexion)  STRENGTH:  Graded on a 0-5 scale Muscle Group Left Right  Gross Upper Extremity [facility]  Hip Flex 3+ pain lim 3+ pain lim  Hip Abd 4 4  Hip Add 4 4  Knee Flex 4- 4-  Knee Ext 5 5  Ankle DF 4 4  Ankle Inversion 4 4+  Ankle Eversion 4+ 4+   Sensory:  Light touch: No reported impairments    Proprioception: Requires further assessment  Coordination:   Finger to nose: Decreased speed with LUE   Observations:   Posture: Upright posture, genu recurvatum in stance, posterior pelvic tilt   Pt required multiple sitting rest breaks due to fatigue with light activity. Pt's HR got up to >150BPM with   walking at comfortable pace. O2 sats remained >95%   BALANCE:   Romberg Eyes opened closed: Increased sway with minor LOB posteriorly after 10-15 sec eyes closed  Sharpened Romberg eyes opened/closed: Lateral LOB after 10-20 sec eyes opened  Head turns: LOB posteriorly with looking up  Static Standing Balance  Normal Able to maintain standing balance against maximal resistance   Good Able to maintain standing balance against moderate resistance   Good-/Fair+ Able to maintain standing balance against minimal resistance   Fair Able to stand unsupported without UE support and without LOB for 1-2 min X  Fair- Requires Min A and UE support to maintain standing without loss of  balance   Poor+ Requires mod A and UE support to maintain standing without loss of balance   Poor Requires max A and UE support to maintain standing balance without loss     Standing Dynamic Balance  Normal Stand independently unsupported, able to weight shift and cross midline maximally   Good Stand independently unsupported, able to weight shift and cross midline moderately   Good-/Fair+ Stand independently unsupported, able to weight shift across midline minimally X  Fair Stand independently unsupported, weight shift, and reach ipsilaterally, loss of balance when crossing midline   Poor+ Able to stand with Min A and reach ipsilaterally, unable to weight shift   Poor+ Able to stand with Mod A and minimally reach ipsilaterally, unable to cross midline.     FUNCTIONAL MOBILITY:   Transfers: Pt able to stand easily with no UE assist and no LOB   Gait: mild toe in and decreased stride width   OUTCOME MEASURES: TEST Outcome Interpretation  Berg Balance Assessment                 53/56 Low fall risk   FGA                18/30 High fall risk       ABC                 57/100 <67 indicates pt is at ncreased risk for falls         Objective measurements completed on examination: See above findings.          Pt instructed in sensory adaptation training (see patient instructions for details) with progression in stance from romberg, to semi-tandem, to tandem while standing in a corner with a sturdy chair in front for safety. Each stance x 30 sec; pt cued to keep hands near the wall for safety.         PT Education - 09/30/17 1736    Education provided  Yes    Education Details  Exam findings, plan of care    Person(s) Educated  Patient    Methods  Explanation;Demonstration;Tactile cues;Verbal cues;Handout    Comprehension  Returned demonstration;Verbalized understanding;Verbal cues required;Tactile cues required          PT Long Term Goals - 09/30/17 1803      PT  LONG TERM GOAL #1   Title  Patient will increase Berg Balance score to 56/56 points to demonstrate decreased fall risk during functional activities.    Baseline  53/56    Time  4    Period  Weeks    Status  New    Target Date  10/28/17      PT LONG TERM GOAL #2   Title  Patient will increase Functional Gait Assessment score to >25/30 as to reduce fall risk and improve dynamic gait safety with community ambulation.    Baseline  18/30    Time  4    Period  Weeks    Status  New    Target Date  10/28/17      PT LONG TERM GOAL #3   Title  Patient will increase ABC scale score >80% to demonstrate better functional mobility and better confidence with ADLs.     Baseline  57% functioning     Time  4    Period  Weeks    Status  New    Target Date  10/28/17      PT LONG TERM GOAL #4   Title  Patient will increase BLE gross strength to 4+/5 as to improve functional strength for independent gait, increased standing tolerance and increased ADL ability.    Baseline  See eval 11/13    Time  4    Period  Weeks    Status  New    Target Date  10/28/17             Plan - 09/30/17 1757    Clinical Impression Statement  76 y/o female presents to therapy with referral for ataxia and deconditioning. Pt has had a decreased activity level over the last few years since her husband died. Her balance has been declining gradually over that time, though she denies falls recently. Pt was found to have decreased strength in LEs, especially hip flexors. She is generally hypomobile with decreased hip stability, which she reports is chronic. Coordination appears to be mildly limited on L side. Pt was found to have lower fall risk based on Berg balance test, but FGA indicates pt is at high fall risk. Pt's balance limitations are primarily in dynamic balance. Pt relies heavily on vision for balance. ABC indicates pt is at increased risk for falls with 57% confidence in balance. Pt has posterior LOB when she looks  up. She has had vertigo in the past for as long as 2 weeks, but has never been treated for it. No recent issues with vertigo.     History and Personal Factors relevant to plan of care:  (-) advanced age (+) high PLOF    Clinical Presentation  Evolving    Clinical Presentation due to:  High fall risk    Clinical Decision Making  Moderate    Rehab Potential  Good    Clinical Impairments Affecting Rehab Potential  Chronic issues, advanced age    PT Frequency  2x / week    PT Duration  4 weeks    PT Treatment/Interventions  ADLs/Self Care Home Management;Aquatic Therapy;Gait training;Stair training;Functional mobility training;Therapeutic activities;Neuromuscular re-education;Balance training;Therapeutic exercise;Patient/family education;Manual techniques;Energy conservation;Passive range of motion;Vestibular;Visual/perceptual remediation/compensation    PT Next Visit Plan  Further assess vestibular impairments, proprioception screen, balance training with head turns    PT Home Exercise Plan  Sensory adaptation with stance progression from romberg, to semi-tandem, to tandem while standing in a corner with a sturdy chair in front for safety.     Consulted and Agree with Plan of Care  Patient       Patient will benefit from skilled therapeutic intervention in order to improve the following deficits and  impairments:  Abnormal gait, Decreased activity tolerance, Cardiopulmonary status limiting activity, Decreased balance, Decreased coordination, Decreased mobility, Decreased safety awareness, Decreased strength, Difficulty walking, Hypermobility, Postural dysfunction, Improper body mechanics, Impaired perceived functional ability  Visit Diagnosis: Muscle weakness (generalized)  Difficulty in walking, not elsewhere classified  Unsteadiness on feet  G-Codes - 10/10/2017 1910    Functional Assessment Tool Used (Outpatient Only)  Clinical impression/ pain/ muscle weakness/ gait difficulty/ Berg     Functional Limitation  Mobility: Walking and moving around    Mobility: Walking and Moving Around Current Status [phone]  At least 20 percent but less than 40 percent impaired, limited or restricted    Mobility: Walking and Moving Around Goal Status [phone]  At least 1 percent but less than 20 percent impaired, limited or restricted        Problem List Patient Active Problem List   Diagnosis Date Noted  . Syncope 04/20/2016  . Anxiety 01/09/2016  . DD (diverticular disease) 01/09/2016  . Bloodgood disease 01/09/2016  . Glaucoma 01/09/2016  . Hypercholesterolemia 01/09/2016  . 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 Menopausal osteoporosis 01/09/2016  . LBP (low back pain) 10/19/2014  . 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 Chronic obstructive pulmonary disease ([place]) 09/08/2014  . Esophagitis, reflux 09/08/2014  . Avitaminosis D 09/08/2014  . BP (high blood pressure) 06/06/2014  . Cephalalgia 03/30/2014  . Hand numbness 03/30/2014   Retaj Hilbun Lenis Dickinson, SPT Pura Spice, PT, DPT # [id number] 10/01/2017, 10:11 AM  [facility] [address] [place], [place], [address] Phone: [phone]   Fax:  [phone]  Name: Jessica Bowman MRN: [id number] Date of Birth: [birth year]

## 2017-10-02 ENCOUNTER — Encounter: Payer: Medicare Other | Admitting: Physical Therapy

## 2017-10-07 ENCOUNTER — Encounter: Payer: Self-pay | Admitting: Physical Therapy

## 2017-10-07 ENCOUNTER — Ambulatory Visit: Payer: Medicare Other | Admitting: Physical Therapy

## 2017-10-07 DIAGNOSIS — R262 Difficulty in walking, not elsewhere classified: Secondary | ICD-10-CM

## 2017-10-07 DIAGNOSIS — M6281 Muscle weakness (generalized): Secondary | ICD-10-CM | POA: Diagnosis not present

## 2017-10-07 DIAGNOSIS — R2681 Unsteadiness on feet: Secondary | ICD-10-CM

## 2017-10-07 NOTE — Therapy (Signed)
[facility] [address]. [place], [place], [address] Phone: [phone]   Fax:  [phone]  Physical Therapy Treatment  Patient Details  Name: Jessica Bowman MRN: [id number] Date of Birth: 1941-04-27 Referring Provider: Dr. Raechel Ache   Encounter Date: 10/07/2017  PT End of Session - 10/07/17 1832    Visit Number  2    Number of Visits  8    Date for PT Re-Evaluation  10/28/17    Authorization Type  G-Codes    Authorization - Visit Number  2    Authorization - Number of Visits  10    PT Start Time  1426    PT Stop Time  1515    PT Time Calculation (min)  49 min    Equipment Utilized During Treatment  Gait belt    Activity Tolerance  Patient tolerated treatment well;Patient limited by fatigue;No increased pain    Behavior During Therapy  WFL for tasks assessed/performed       Past Medical History:  Diagnosis Date  . Anxiety   . Cancer ([place])    skin ca on nose  . COPD (chronic obstructive pulmonary disease) ([place])    exacerbation 04/16/2017  . Diverticulosis   . Dyspnea    with exertion  . Fibrocystic breast disease   . GERD (gastroesophageal reflux disease)   . Glaucoma   . Hypercholesterolemia   . Hypertension   . IBS (irritable bowel syndrome)   . Migraines   . Osteoporosis   . Psoriasis   . Skin cancer of nose   . 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 Vitamin D deficiency     Past Surgical History:  Procedure Laterality Date  . APPENDECTOMY    . APPENDECTOMY    . BREAST BIOPSY Left    neg  . BREAST BIOPSY Right    neg  . CATARACT EXTRACTION W/PHACO Right 05/14/2017   Procedure: CATARACT EXTRACTION PHACO AND INTRAOCULAR LENS PLACEMENT ([place])  Right;  Surgeon: Leandrew Koyanagi, MD;  Location: [facility];  Service: Ophthalmology;  Laterality: Right;  . CEREBRAL ANEURYSM REPAIR     aneurysm repair  . TONSILLECTOMY      There were no vitals filed for this visit.  Subjective Assessment -  10/07/17 1427    Subjective  Pt felt sore after doing her HEP. She has had some cramping in hamstrings with SLS.    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 Pt reports that balance and deconditioning has become an issues over the last few years since her husband died. Pt reports no falls recently. Medical Hx significant for HTN, COPD, and osteoporosis. Pt had visual deficits with diplopia following removal of an uninterrupted aneurysm in 2011.  Pt has "fainted" several times in the past, most recently in 2017. She hit her head, but did not seek medical treatment.    Patient Stated Goals  To feel more confident about balance/ decreased fear of falling    Currently in Pain?  No/denies         Treatment:   Ther-ex: Supine:  Bridge with clam 2 x 10 resisted with YTB Sidelying:  Clamshell x 10 each side Standing:  4 way hip rested with YTB x 10 each direction with each LE; pt had increased lateral hip pain on R with hip ext and abd.    Neuro Re-education Standing in parallel bars:  Stance  progression on 2" foam surface including neutral stance, Romberg, semi-tandem, and  tandem stance with head turns up, down, L, and R in each stance; pt with occasional LOB in staggered and tandem stance requiring minA.   Repeated with medium weighted ball pass in all directions x 8 min   Pt re-instructed in sensory adaptation training  HEP updated, see pt instructions    PT Education - 10/07/17 1832    Education provided  Yes    Education Details  HEP advanced    Person(s) Educated  Patient    Methods  Explanation;Demonstration;Tactile cues;Verbal cues;Handout    Comprehension  Verbalized understanding;Returned demonstration;Tactile cues required;Verbal cues required          PT Long Term Goals - 09/30/17 1803      PT LONG TERM GOAL #1   Title  Patient will increase Berg Balance score to 56/56 points to demonstrate decreased  fall risk during functional activities.    Baseline  53/56    Time  4    Period  Weeks    Status  New    Target Date  10/28/17      PT LONG TERM GOAL #2   Title  Patient will increase Functional Gait Assessment score to >25/30 as to reduce fall risk and improve dynamic gait safety with community ambulation.    Baseline  18/30    Time  4    Period  Weeks    Status  New    Target Date  10/28/17      PT LONG TERM GOAL #3   Title  Patient will increase ABC scale score >80% to demonstrate better functional mobility and better confidence with ADLs.     Baseline  57% functioning     Time  4    Period  Weeks    Status  New    Target Date  10/28/17      PT LONG TERM GOAL #4   Title  Patient will increase BLE gross strength to 4+/5 as to improve functional strength for independent gait, increased standing tolerance and increased ADL ability.    Baseline  See eval 11/13    Time  4    Period  Weeks    Status  New    Target Date  10/28/17         Plan - 10/07/17 1839    Clinical Impression Statement  Pt has been doing well with her HEP. She has had some soreness, but not more than expected. HEP advanced with strengthening exercises for hips/LEs. Pt continues to have multiple LOB with dynamic balance in narrow BOS. Pt is primarily challenged by looking up with moth static and dynamic balance. Plan to progress with gait + dual tasking.      Clinical Presentation  Evolving    Clinical Decision Making  Moderate    Rehab Potential  Good    Clinical Impairments Affecting Rehab Potential  Chronic issues, advanced age    PT Frequency  2x / week    PT Duration  4 weeks    PT Treatment/Interventions  ADLs/Self Care Home Management;Aquatic Therapy;Gait training;Stair training;Functional mobility training;Therapeutic activities;Neuromuscular re-education;Balance training;Therapeutic exercise;Patient/family education;Manual techniques;Energy conservation;Passive range of  motion;Vestibular;Visual/perceptual remediation/compensation    PT Next Visit Plan  Further assess vestibular impairments,  gait + dual tasking, balance training with head turns, rebounder    PT Home Exercise Plan  Added 4-way hip, bridge + clamshellSensory adaptation with stance progression from romberg, to semi-tandem, to tandem  while standing in a corner with a sturdy chair in front for safety.     Consulted and Agree with Plan of Care  Patient       Patient will benefit from skilled therapeutic intervention in order to improve the following deficits and impairments:  Abnormal gait, Decreased activity tolerance, Cardiopulmonary status limiting activity, Decreased balance, Decreased coordination, Decreased mobility, Decreased safety awareness, Decreased strength, Difficulty walking, Hypermobility, Postural dysfunction, Improper body mechanics, Impaired perceived functional ability  Visit Diagnosis: Muscle weakness (generalized)  Difficulty in walking, not elsewhere classified  Unsteadiness on feet     Problem List Patient Active Problem List   Diagnosis Date Noted  . Syncope 04/20/2016  . Anxiety 01/09/2016  . DD (diverticular disease) 01/09/2016  . Bloodgood disease 01/09/2016  . Glaucoma 01/09/2016  . Hypercholesterolemia 01/09/2016  . 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 Menopausal osteoporosis 01/09/2016  . LBP (low back pain) 10/19/2014  . 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 Chronic obstructive pulmonary disease ([place]) 09/08/2014  . Esophagitis, reflux 09/08/2014  . Avitaminosis D 09/08/2014  . BP (high blood pressure) 06/06/2014  . Cephalalgia 03/30/2014  . Hand numbness 03/30/2014   Yarissa Reining Lenis Dickinson, SPT Pura Spice, PT, DPT # [phone] 10/08/2017, 8:30 AM  [facility] [address] [place], [place], [address] Phone: [phone]   Fax:  [phone]  Name: ASSIA MEANOR MRN: [id number] Date of Birth: 1941-10-18

## 2017-10-14 ENCOUNTER — Encounter: Payer: Self-pay | Admitting: Physical Therapy

## 2017-10-14 ENCOUNTER — Ambulatory Visit: Payer: Medicare Other | Admitting: Physical Therapy

## 2017-10-14 VITALS — HR 144

## 2017-10-14 DIAGNOSIS — M6281 Muscle weakness (generalized): Secondary | ICD-10-CM | POA: Diagnosis not present

## 2017-10-14 DIAGNOSIS — R262 Difficulty in walking, not elsewhere classified: Secondary | ICD-10-CM

## 2017-10-14 DIAGNOSIS — R2681 Unsteadiness on feet: Secondary | ICD-10-CM

## 2017-10-14 NOTE — Therapy (Signed)
[facility] [address] [facility] [address] [place], [place], [address] Phone: [phone]   Fax:  [phone]  Physical Therapy Treatment  Patient Details  Name: Jessica Bowman MRN: [id number] Date of Birth: 11-11-1941 Referring Provider: Dr. Raechel Ache   Encounter Date: 10/14/2017  PT End of Session - 10/14/17 1043    Visit Number  3    Number of Visits  8    Date for PT Re-Evaluation  10/28/17    Authorization Type  G-Codes    Authorization - Visit Number  3    Authorization - Number of Visits  10    PT Start Time  [phone]    PT Stop Time  1034    PT Time Calculation (min)  48 min    Equipment Utilized During Treatment  Gait belt    Activity Tolerance  Patient tolerated treatment well;Patient limited by fatigue;No increased pain    Behavior During Therapy  WFL for tasks assessed/performed       Past Medical History:  Diagnosis Date  . Anxiety   . Cancer ([place])    skin ca on nose  . COPD (chronic obstructive pulmonary disease) ([place])    exacerbation 04/16/2017  . Diverticulosis   . Dyspnea    with exertion  . Fibrocystic breast disease   . GERD (gastroesophageal reflux disease)   . Glaucoma   . Hypercholesterolemia   . Hypertension   . IBS (irritable bowel syndrome)   . Migraines   . Osteoporosis   . Psoriasis   . Skin cancer of nose   . 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 Vitamin D deficiency     Past Surgical History:  Procedure Laterality Date  . APPENDECTOMY    . APPENDECTOMY    . BREAST BIOPSY Left    neg  . BREAST BIOPSY Right    neg  . CATARACT EXTRACTION W/PHACO Right 05/14/2017   Procedure: CATARACT EXTRACTION PHACO AND INTRAOCULAR LENS PLACEMENT ([place])  Right;  Surgeon: Leandrew Koyanagi, MD;  Location: [place];  Service: Ophthalmology;  Laterality: Right;  . CEREBRAL ANEURYSM REPAIR     aneurysm repair  . TONSILLECTOMY      Vitals:   10/14/17 1219  Pulse: (!) 144  SpO2: 95%    Subjective  Assessment - 10/14/17 0947    Subjective  Pt is doing ok today. She reports that she strained her R hamstring while doing wall push-ups. She has been having minor pain with walking. 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 Pt reports that balance and deconditioning has become an issues over the last few years since her husband died. Pt reports no falls recently. Medical Hx significant for HTN, COPD, and osteoporosis. Pt had visual deficits with diplopia following removal of an uninterrupted aneurysm in 2011.  Pt has "fainted" several times in the past, most recently in 2017. She hit her head, but did not seek medical treatment.    Patient Stated Goals  To feel more confident about balance/ decreased fear of falling    Currently in Pain?  No/denies        Treatment:   Patient education on exercise and balance with aging; discussed MRI from 2015, explained small vessel disease and atrophy.   Discussed HEP; clarified set-up for band resisted 4-way hip  Neuro Re-education Standing:  Rebounder with stance progression on firm surface including neutral stance, Romberg, semi-tandem, and tandem stance with 4# weighted ball throw x 5-10 throws in each stance with each foot forward; repeated standing on 2" Airex foam for increased challenge to balance. Pt with occasional minor LOB laterally requiring light minA in tandem and staggered stances. X 16-18 min.   Pt limited by fatigue requiring sitting rest break every 2-3 min   Standing in parallel bars:             Tandem walking; pt with lateral LOB requiring UE assist frequently; cues for core activation to improve stability. Pt limited by fatigue requiring sitting rest break every 2-3 min  Vitals taken with pulse ox frequently during sitting rest breaks with HR up to 145 BPM and O2 sats 95%; HR returned to 110-115 BPM within 2-3 min of sitting rest  HEP  updated with hamstring stretch and balance posture progression on foam surface, (See pt instructions)       PT Long Term Goals - 09/30/17 1803      PT LONG TERM GOAL #1   Title  Patient will increase Berg Balance score to 56/56 points to demonstrate decreased fall risk during functional activities.    Baseline  53/56    Time  4    Period  Weeks    Status  New    Target Date  10/28/17      PT LONG TERM GOAL #2   Title  Patient will increase Functional Gait Assessment score to >25/30 as to reduce fall risk and improve dynamic gait safety with community ambulation.    Baseline  18/30    Time  4    Period  Weeks    Status  New    Target Date  10/28/17      PT LONG TERM GOAL #3   Title  Patient will increase ABC scale score >80% to demonstrate better functional mobility and better confidence with ADLs.     Baseline  57% functioning     Time  4    Period  Weeks    Status  New    Target Date  10/28/17      PT LONG TERM GOAL #4   Title  Patient will increase BLE gross strength to 4+/5 as to improve functional strength for independent gait, increased standing tolerance and increased ADL ability.    Baseline  See eval 11/13    Time  4    Period  Weeks    Status  New    Target Date  10/28/17         Plan - 10/14/17 1234    Clinical Impression Statement  Pt continues to be limited in balance with lateral LOB in stance postures with reduced BOS. Pt limited by fatigue and SOB during session; vitals taken with pulse ox frequently during sitting rest breaks with HR up to 145 BPM and O2 sats 95%; HR returned to 110-115 BPM within 2-3 min of sitting rest. Pt educated on the importance of cardiorespiratory exercise at home. Pt instructed to use stationary foot bike and initiate walking program as part of HEP.     Clinical Presentation  Evolving    Clinical Decision Making  Moderate    Rehab Potential  Good    Clinical Impairments Affecting Rehab Potential  Chronic issues, advanced age     PT Frequency  2x / week    PT Duration  4 weeks  PT Treatment/Interventions  ADLs/Self Care Home Management;Aquatic Therapy;Gait training;Stair training;Functional mobility training;Therapeutic activities;Neuromuscular re-education;Balance training;Therapeutic exercise;Patient/family education;Manual techniques;Energy conservation;Passive range of motion;Vestibular;Visual/perceptual remediation/compensation    PT Next Visit Plan  Follow-up on walking program/stationalry bike at home; monitor hamstring strain, gait + dual tasking, balance training with head turns, rebounder    PT Home Exercise Plan  Added balance progression on foam surface, hamstring stretch; Continued  4-way hip, bridge + clamshell, Sensory adaptation with stance progression from romberg, to semi-tandem, to tandem while standing in a corner with a sturdy chair in front for safety.     Consulted and Agree with Plan of Care  Patient       Patient will benefit from skilled therapeutic intervention in order to improve the following deficits and impairments:  Abnormal gait, Decreased activity tolerance, Cardiopulmonary status limiting activity, Decreased balance, Decreased coordination, Decreased mobility, Decreased safety awareness, Decreased strength, Difficulty walking, Hypermobility, Postural dysfunction, Improper body mechanics, Impaired perceived functional ability  Visit Diagnosis: Muscle weakness (generalized)  Difficulty in walking, not elsewhere classified  Unsteadiness on feet     Problem List Patient Active Problem List   Diagnosis Date Noted  . Syncope 04/20/2016  . Anxiety 01/09/2016  . DD (diverticular disease) 01/09/2016  . Bloodgood disease 01/09/2016  . Glaucoma 01/09/2016  . Hypercholesterolemia 01/09/2016  . 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 Menopausal osteoporosis 01/09/2016  . LBP (low back pain) 10/19/2014  . 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 Chronic obstructive pulmonary disease ([place]) 09/08/2014   . Esophagitis, reflux 09/08/2014  . Avitaminosis D 09/08/2014  . BP (high blood pressure) 06/06/2014  . Cephalalgia 03/30/2014  . Hand numbness 03/30/2014   Reigna Ruperto Lenis Dickinson, SPT Pura Spice, PT, DPT # [id number] 10/14/2017, 12:37 PM  [place] [facility] [address] [place], [place], [address] Phone: [phone]   Fax:  [phone]  Name: SHAQUILA SIGMAN MRN: [id number] Date of Birth: 1941/04/11

## 2017-10-16 ENCOUNTER — Encounter: Payer: Medicare Other | Admitting: Physical Therapy

## 2017-10-21 ENCOUNTER — Ambulatory Visit: Payer: Medicare Other | Attending: Internal Medicine | Admitting: Physical Therapy

## 2017-10-21 ENCOUNTER — Encounter: Payer: Self-pay | Admitting: Physical Therapy

## 2017-10-21 DIAGNOSIS — M6281 Muscle weakness (generalized): Secondary | ICD-10-CM | POA: Diagnosis present

## 2017-10-21 DIAGNOSIS — R2681 Unsteadiness on feet: Secondary | ICD-10-CM | POA: Insufficient documentation

## 2017-10-21 DIAGNOSIS — R262 Difficulty in walking, not elsewhere classified: Secondary | ICD-10-CM | POA: Insufficient documentation

## 2017-10-22 NOTE — Therapy (Signed)
[facility] [address] [place], [place], [address] Phone: [phone]   Fax:  [phone]  Physical Therapy Treatment  Patient Details  Name: Jessica Bowman MRN: [id number] Date of Birth: May 21, 1941 Referring Provider: Dr. Raechel Ache   Encounter Date: 10/21/2017  PT End of Session - 10/21/17 0946    Visit Number  4    Number of Visits  8    Date for PT Re-Evaluation  10/28/17    Authorization Type  G-Codes    Authorization - Visit Number  4    Authorization - Number of Visits  10    PT Start Time  0940    PT Stop Time  1035    PT Time Calculation (min)  55 min    Equipment Utilized During Treatment  Gait belt    Activity Tolerance  Patient tolerated treatment well;Patient limited by fatigue;No increased pain    Behavior During Therapy  WFL for tasks assessed/performed       Past Medical History:  Diagnosis Date  . Anxiety   . Cancer ([place])    skin ca on nose  . COPD (chronic obstructive pulmonary disease) ([place])    exacerbation 04/16/2017  . Diverticulosis   . Dyspnea    with exertion  . Fibrocystic breast disease   . GERD (gastroesophageal reflux disease)   . Glaucoma   . Hypercholesterolemia   . Hypertension   . IBS (irritable bowel syndrome)   . Migraines   . Osteoporosis   . Psoriasis   . Skin cancer of nose   . 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 Vitamin D deficiency     Past Surgical History:  Procedure Laterality Date  . APPENDECTOMY    . APPENDECTOMY    . BREAST BIOPSY Left    neg  . BREAST BIOPSY Right    neg  . CATARACT EXTRACTION W/PHACO Right 05/14/2017   Procedure: CATARACT EXTRACTION PHACO AND INTRAOCULAR LENS PLACEMENT ([place])  Right;  Surgeon: Leandrew Koyanagi, MD;  Location: [place];  Service: Ophthalmology;  Laterality: Right;  . CEREBRAL ANEURYSM REPAIR     aneurysm repair  . TONSILLECTOMY      There were no vitals filed for this visit.  Subjective Assessment -  10/21/17 0945    Subjective  Pt. reports IBS flare-up last week that has lasted into weekend.  No pain reported.  Pt. feels wiped out today.      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 Pt reports that balance and deconditioning has become an issues over the last few years since her husband died. Pt reports no falls recently. Medical Hx significant for HTN, COPD, and osteoporosis. Pt had visual deficits with diplopia following removal of an uninterrupted aneurysm in 2011.  Pt has "fainted" several times in the past, most recently in 2017. She hit her head, but did not seek medical treatment.    Patient Stated Goals  To feel more confident about balance/ decreased fear of falling    Currently in Pain?  No/denies         Treatment:  Neuro:  //-bar balance tasks:  Tandem walking (f/b)/ lateral walking/ braiding (min. To no UE assist).  Step touches on 6" step/ Step ups 10x on L/R with no UE assist (CGA for safety/ cuing).  Hallway tasks: alt. UE and LE with cuing for upright posture 45 feet x 2.  Walking with head turns/ letter reading (slight difficulty).  Increase SOB and O2 sat was 93% with quick recovery to 95%.  Resisted walking with 1BTB 5x all 4-planes.  Light to no UE assist in //-bars.  Fatigue noted.  Ambulate outside to car/ curb maneuvering.    Reviewed HEP.      PT Long Term Goals - 09/30/17 1803      PT LONG TERM GOAL #1   Title  Patient will increase Berg Balance score to 56/56 points to demonstrate decreased fall risk during functional activities.    Baseline  53/56    Time  4    Period  Weeks    Status  New    Target Date  10/28/17      PT LONG TERM GOAL #2   Title  Patient will increase Functional Gait Assessment score to >25/30 as to reduce fall risk and improve dynamic gait safety with community ambulation.    Baseline  18/30    Time  4    Period  Weeks    Status  New    Target Date  10/28/17      PT  LONG TERM GOAL #3   Title  Patient will increase ABC scale score >80% to demonstrate better functional mobility and better confidence with ADLs.     Baseline  57% functioning     Time  4    Period  Weeks    Status  New    Target Date  10/28/17      PT LONG TERM GOAL #4   Title  Patient will increase BLE gross strength to 4+/5 as to improve functional strength for independent gait, increased standing tolerance and increased ADL ability.    Baseline  See eval 11/13    Time  4    Period  Weeks    Status  New    Target Date  10/28/17            Plan - 10/21/17 0947    Clinical Impression Statement  Pt. very fatigued prior to tx. session but willing to work with PT today.  Pts. O2 saturation rate ranged from 92% to 95% t/o tx. session.  Pt. did experience 1 LOB during dual task walking in hallway but able to self-correct.  Increase SOB after balance/ gait tasks in hallway requiring a seated rest break.  Pt. able to complete step touches/ ups with no UE assist.      Clinical Presentation  Evolving    Clinical Decision Making  Moderate    Rehab Potential  Good    Clinical Impairments Affecting Rehab Potential  Chronic issues, advanced age    PT Frequency  2x / week    PT Duration  4 weeks    PT Treatment/Interventions  ADLs/Self Care Home Management;Aquatic Therapy;Gait training;Stair training;Functional mobility training;Therapeutic activities;Neuromuscular re-education;Balance training;Therapeutic exercise;Patient/family education;Manual techniques;Energy conservation;Passive range of motion;Vestibular;Visual/perceptual remediation/compensation    PT Next Visit Plan  Reassess balance next tx./ duel task gait training.      PT Home Exercise Plan  Added balance progression on foam surface, hamstring stretch; Continued  4-way hip, bridge + clamshell, Sensory adaptation with stance progression from romberg, to semi-tandem, to tandem while standing in a corner with a sturdy chair in front for  safety.     Consulted and Agree with Plan of Care  Patient       Patient will benefit from skilled therapeutic intervention in order to improve the following deficits and  impairments:  Abnormal gait, Decreased activity tolerance, Cardiopulmonary status limiting activity, Decreased balance, Decreased coordination, Decreased mobility, Decreased safety awareness, Decreased strength, Difficulty walking, Hypermobility, Postural dysfunction, Improper body mechanics, Impaired perceived functional ability  Visit Diagnosis: Muscle weakness (generalized)  Difficulty in walking, not elsewhere classified  Unsteadiness on feet     Problem List Patient Active Problem List   Diagnosis Date Noted  . Syncope 04/20/2016  . Anxiety 01/09/2016  . DD (diverticular disease) 01/09/2016  . Bloodgood disease 01/09/2016  . Glaucoma 01/09/2016  . Hypercholesterolemia 01/09/2016  . 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 Menopausal osteoporosis 01/09/2016  . LBP (low back pain) 10/19/2014  . 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 Chronic obstructive pulmonary disease ([place]) 09/08/2014  . Esophagitis, reflux 09/08/2014  . Avitaminosis D 09/08/2014  . BP (high blood pressure) 06/06/2014  . Cephalalgia 03/30/2014  . Hand numbness 03/30/2014   Pura Spice, PT, DPT # [id number] 10/22/2017, 1:22 PM  [facility] [address] [place], [place], [address] Phone: [phone]   Fax:  [phone]  Name: Jessica Bowman MRN: [id number] Date of Birth: 1941/01/04

## 2017-10-23 ENCOUNTER — Ambulatory Visit: Payer: Medicare Other | Admitting: Physical Therapy

## 2017-10-23 DIAGNOSIS — M6281 Muscle weakness (generalized): Secondary | ICD-10-CM

## 2017-10-23 DIAGNOSIS — R262 Difficulty in walking, not elsewhere classified: Secondary | ICD-10-CM

## 2017-10-23 DIAGNOSIS — R2681 Unsteadiness on feet: Secondary | ICD-10-CM

## 2017-10-24 ENCOUNTER — Encounter: Payer: Self-pay | Admitting: Physical Therapy

## 2017-10-24 NOTE — Therapy (Signed)
[facility] [address] [place], [place], [address] Phone: [phone]   Fax:  [phone]  Physical Therapy Treatment  Patient Details  Name: Jessica Bowman MRN: [id number] Date of Birth: [birth year] Referring Provider: Dr. Raechel Ache   Encounter Date: 10/23/2017  PT End of Session - 10/24/17 1717    Visit Number  5    Number of Visits  8    Date for PT Re-Evaluation  10/28/17    Authorization Type  G-Codes    Authorization - Visit Number  5    Authorization - Number of Visits  10    PT Start Time  [phone]    PT Stop Time  1025    PT Time Calculation (min)  54 min    Equipment Utilized During Treatment  Gait belt    Activity Tolerance  Patient tolerated treatment well;Patient limited by fatigue;No increased pain    Behavior During Therapy  WFL for tasks assessed/performed       Past Medical History:  Diagnosis Date  . Anxiety   . Cancer ([place])    skin ca on nose  . COPD (chronic obstructive pulmonary disease) ([place])    exacerbation 04/16/2017  . Diverticulosis   . Dyspnea    with exertion  . Fibrocystic breast disease   . GERD (gastroesophageal reflux disease)   . Glaucoma   . Hypercholesterolemia   . Hypertension   . IBS (irritable bowel syndrome)   . Migraines   . Osteoporosis   . Psoriasis   . Skin cancer of nose   . 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 Vitamin D deficiency     Past Surgical History:  Procedure Laterality Date  . APPENDECTOMY    . APPENDECTOMY    . BREAST BIOPSY Left    neg  . BREAST BIOPSY Right    neg  . CATARACT EXTRACTION W/PHACO Right 05/14/2017   Procedure: CATARACT EXTRACTION PHACO AND INTRAOCULAR LENS PLACEMENT ([place])  Right;  Surgeon: Leandrew Koyanagi, MD;  Location: [place];  Service: Ophthalmology;  Laterality: Right;  . CEREBRAL ANEURYSM REPAIR     aneurysm repair  . TONSILLECTOMY      There were no vitals filed for this visit.  Subjective Assessment -  10/24/17 1712    Subjective  Pt. reports being tired and short of breath this morning.  No c/o pain.      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 Pt reports that balance and deconditioning has become an issues over the last few years since her husband died. Pt reports no falls recently. Medical Hx significant for HTN, COPD, and osteoporosis. Pt had visual deficits with diplopia following removal of an uninterrupted aneurysm in 2011.  Pt has "fainted" several times in the past, most recently in 2017. She hit her head, but did not seek medical treatment.    Patient Stated Goals  To feel more confident about balance/ decreased fear of falling    Currently in Pain?  No/denies          Treatment:  Neuro:  //-bar balance tasks:  Tandem walking (f/b)/ lateral walking/ braiding (min. To no UE assist).  Star tasks: 3 cones/ varying colors (CGA for safety esp. With scissoring).  Step touches on 6" step/ Step ups 10x on L/R with no UE assist (CGA for safety/ cuing). Standing alt. UE/LE with mirror feedback (different speeds).  Hallway tasks: alt. UE and LE with cuing for upright posture 45 feet x 2.  Walking with head turns/ letter reading (slight difficulty).  Increase SOB and O2 sat was 92% with quick recovery to 95%.  Resisted walking with 1BTB 5x all 4-planes.  Light to no UE assist in //-bars.  Discussed Sit and get Fit program.  Nustep L5 5 min. B UE/LE.     Reviewed HEP.    PT Long Term Goals - 09/30/17 1803      PT LONG TERM GOAL #1   Title  Patient will increase Berg Balance score to 56/56 points to demonstrate decreased fall risk during functional activities.    Baseline  53/56    Time  4    Period  Weeks    Status  New    Target Date  10/28/17      PT LONG TERM GOAL #2   Title  Patient will increase Functional Gait Assessment score to >25/30 as to reduce fall risk and improve dynamic gait safety with community ambulation.     Baseline  18/30    Time  4    Period  Weeks    Status  New    Target Date  10/28/17      PT LONG TERM GOAL #3   Title  Patient will increase ABC scale score >80% to demonstrate better functional mobility and better confidence with ADLs.     Baseline  57% functioning     Time  4    Period  Weeks    Status  New    Target Date  10/28/17      PT LONG TERM GOAL #4   Title  Patient will increase BLE gross strength to 4+/5 as to improve functional strength for independent gait, increased standing tolerance and increased ADL ability.    Baseline  See eval 11/13    Time  4    Period  Weeks    Status  New    Target Date  10/28/17            Plan - 10/24/17 1718    Clinical Impression Statement  Increase SOB with repetitive hallway activities/ balance tasks requiring short seated rest breaks.  Pts. O2 sat. never decreased <92%.  Pt. had difficulty with star tasks/ scissoring legs and maintaining balance.  CGA with higher level tasks today.  Pt. able to complete 5 min. on Nustep with no issues.      Clinical Presentation  Evolving    Clinical Decision Making  Moderate    Rehab Potential  Good    Clinical Impairments Affecting Rehab Potential  Chronic issues, advanced age    PT Frequency  2x / week    PT Duration  4 weeks    PT Treatment/Interventions  ADLs/Self Care Home Management;Aquatic Therapy;Gait training;Stair training;Functional mobility training;Therapeutic activities;Neuromuscular re-education;Balance training;Therapeutic exercise;Patient/family education;Manual techniques;Energy conservation;Passive range of motion;Vestibular;Visual/perceptual remediation/compensation    PT Next Visit Plan  Reassess balance next tx./ duel task gait training.      PT Home Exercise Plan  Added balance progression on foam surface, hamstring stretch; Continued  4-way hip, bridge + clamshell, Sensory adaptation with stance progression from romberg, to semi-tandem, to tandem while standing in a  corner with a sturdy chair in front for safety.     Consulted and Agree with Plan of Care  Patient       Patient will benefit from skilled therapeutic intervention in order to improve the following deficits  and impairments:  Abnormal gait, Decreased activity tolerance, Cardiopulmonary status limiting activity, Decreased balance, Decreased coordination, Decreased mobility, Decreased safety awareness, Decreased strength, Difficulty walking, Hypermobility, Postural dysfunction, Improper body mechanics, Impaired perceived functional ability  Visit Diagnosis: Muscle weakness (generalized)  Difficulty in walking, not elsewhere classified  Unsteadiness on feet     Problem List Patient Active Problem List   Diagnosis Date Noted  . Syncope 04/20/2016  . Anxiety 01/09/2016  . DD (diverticular disease) 01/09/2016  . Bloodgood disease 01/09/2016  . Glaucoma 01/09/2016  . Hypercholesterolemia 01/09/2016  . 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 Menopausal osteoporosis 01/09/2016  . LBP (low back pain) 10/19/2014  . 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 Chronic obstructive pulmonary disease ([place]) 09/08/2014  . Esophagitis, reflux 09/08/2014  . Avitaminosis D 09/08/2014  . BP (high blood pressure) 06/06/2014  . Cephalalgia 03/30/2014  . Hand numbness 03/30/2014   Pura Spice, PT, DPT # [id number] 10/24/2017, 5:21 PM  [place] [facility] [address] [place], [place], [address] Phone: [phone]   Fax:  [phone]  Name: Jessica Bowman MRN: [id number] Date of Birth: [birth year]

## 2017-10-28 ENCOUNTER — Encounter: Payer: Medicare Other | Admitting: Physical Therapy

## 2017-10-30 ENCOUNTER — Encounter: Payer: Medicare Other | Admitting: Physical Therapy

## 2017-11-04 ENCOUNTER — Ambulatory Visit: Payer: Medicare Other | Admitting: Physical Therapy

## 2017-11-04 ENCOUNTER — Encounter: Payer: Self-pay | Admitting: Physical Therapy

## 2017-11-04 DIAGNOSIS — M6281 Muscle weakness (generalized): Secondary | ICD-10-CM

## 2017-11-04 DIAGNOSIS — R262 Difficulty in walking, not elsewhere classified: Secondary | ICD-10-CM

## 2017-11-04 DIAGNOSIS — R2681 Unsteadiness on feet: Secondary | ICD-10-CM

## 2017-11-04 NOTE — Therapy (Addendum)
[facility] [address] [place], [place], [address] Phone: [phone]   Fax:  [phone]  Physical Therapy Treatment  Patient Details  Name: Jessica Bowman MRN: [id number] Date of Birth: 04-Mar-1941 Referring Provider: Dr. Raechel Ache   Encounter Date: 11/04/2017  PT End of Session - 11/07/17 1542    Visit Number  6    Number of Visits  13    Date for PT Re-Evaluation  12/02/17    Authorization Type  G-Codes    Authorization - Visit Number  6    Authorization - Number of Visits  15    PT Start Time  1003    PT Stop Time  1101    PT Time Calculation (min)  58 min    Equipment Utilized During Treatment  Gait belt    Activity Tolerance  Patient tolerated treatment well;Patient limited by fatigue;No increased pain    Behavior During Therapy  WFL for tasks assessed/performed       Past Medical History:  Diagnosis Date  . Anxiety   . Cancer ([place])    skin ca on nose  . COPD (chronic obstructive pulmonary disease) ([place])    exacerbation 04/16/2017  . Diverticulosis   . Dyspnea    with exertion  . Fibrocystic breast disease   . GERD (gastroesophageal reflux disease)   . Glaucoma   . Hypercholesterolemia   . Hypertension   . IBS (irritable bowel syndrome)   . Migraines   . Osteoporosis   . Psoriasis   . Skin cancer of nose   . 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 Vitamin D deficiency     Past Surgical History:  Procedure Laterality Date  . APPENDECTOMY    . APPENDECTOMY    . BREAST BIOPSY Left    neg  . BREAST BIOPSY Right    neg  . CATARACT EXTRACTION W/PHACO Right 05/14/2017   Procedure: CATARACT EXTRACTION PHACO AND INTRAOCULAR LENS PLACEMENT ([place])  Right;  Surgeon: Leandrew Koyanagi, MD;  Location: [place];  Service: Ophthalmology;  Laterality: Right;  . CEREBRAL ANEURYSM REPAIR     aneurysm repair  . TONSILLECTOMY      There were no vitals filed for this visit.  Subjective Assessment -  11/07/17 1506    Subjective  Pt. states she is feeling "blah" today.      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 Pt reports that balance and deconditioning has become an issues over the last few years since her husband died. Pt reports no falls recently. Medical Hx significant for HTN, COPD, and osteoporosis. Pt had visual deficits with diplopia following removal of an uninterrupted aneurysm in 2011.  Pt has "fainted" several times in the past, most recently in 2017. She hit her head, but did not seek medical treatment.    Patient Stated Goals  To feel more confident about balance/ decreased fear of falling    Currently in Pain?  No/denies           Treatment:  Neuro: //-bar balance tasks: Tandem walking (f/b)/ lateral walking/ braiding (min. To no UE assist). Star tasks: 3 cones/ varying colors (CGA for safety esp. With scissoring).  Step touches on 6" step/ Step ups 10x on L/R with no UE assist (CGA for safety/ cuing). Standing alt. UE/LE with mirror feedback (different speeds).  Functional reaching (cones)- varying planes and distances. Hallway tasks:  alt. UE and LE with cuing for upright posture 45 feet x 2. Walking with head turns/ letter reading (slight difficulty).  Resisted walking with 1BTB 5x all 4-planes. Light to no UE assist in //-bars.  Nustep L5 5 min. B UE/LE.    Reviewed HEP.      Pt. easily fatigued with standing ther.ex./ dynamic balance tasks/ gait in clinic.  Pts. vitals remain in normal range for pt. and PT encourages pt. to control breathing/ take rest breaks as needed.  No c/o pain or LOB during tx. session but balance/ safety limited by increase c/o fatigue.  PT encourages pt. to look into gym based ex. at [facility] to progress independence.  Pt. concerned about going to gym with breathing issues/ fatigue issues.   Goals reassessed today.     PT Long Term Goals - 11/07/17  1600      PT LONG TERM GOAL #1   Title  Patient will increase Berg Balance score to 56/56 points to demonstrate decreased fall risk during functional activities.    Baseline  54/56 on 12/18    Time  4    Period  Weeks    Status  Partially Met    Target Date  12/02/17      PT LONG TERM GOAL #2   Title  Patient will increase Functional Gait Assessment score to >25/30 as to reduce fall risk and improve dynamic gait safety with community ambulation.    Baseline  20/25    Time  4    Period  Weeks    Status  Partially Met    Target Date  12/02/17      PT LONG TERM GOAL #3   Title  Patient will increase ABC scale score >80% to demonstrate better functional mobility and better confidence with ADLs.     Baseline  70.6% on 12/18    Time  4    Period  Weeks    Status  Partially Met    Target Date  12/02/17      PT LONG TERM GOAL #4   Title  Patient will increase BLE gross strength to 4+/5 as to improve functional strength for independent gait, increased standing tolerance and increased ADL ability.    Baseline  B LE muscle strength grossly 4/5 MMT.      Time  4    Period  Weeks    Status  On-going    Target Date  12/02/17            Plan - 11/07/17 1546    Clinical Presentation  Evolving    Clinical Decision Making  Moderate    Rehab Potential  Good    Clinical Impairments Affecting Rehab Potential  Chronic issues, advanced age    PT Frequency  2x / week    PT Duration  4 weeks    PT Treatment/Interventions  ADLs/Self Care Home Management;Aquatic Therapy;Gait training;Stair training;Functional mobility training;Therapeutic activities;Neuromuscular re-education;Balance training;Therapeutic exercise;Patient/family education;Manual techniques;Energy conservation;Passive range of motion;Vestibular;Visual/perceptual remediation/compensation    PT Next Visit Plan  Progress higher level balance/ endurance ex.     PT Home Exercise Plan  Added balance progression on foam surface,  hamstring stretch; Continued  4-way hip, bridge + clamshell, Sensory adaptation with stance progression from romberg, to semi-tandem, to tandem while standing in a corner with a sturdy chair in front for safety.     Consulted and Agree with Plan of Care  Patient       Patient  will benefit from skilled therapeutic intervention in order to improve the following deficits and impairments:  Abnormal gait, Decreased activity tolerance, Cardiopulmonary status limiting activity, Decreased balance, Decreased coordination, Decreased mobility, Decreased safety awareness, Decreased strength, Difficulty walking, Hypermobility, Postural dysfunction, Improper body mechanics, Impaired perceived functional ability  Visit Diagnosis: Muscle weakness (generalized)  Difficulty in walking, not elsewhere classified  Unsteadiness on feet     Problem List Patient Active Problem List   Diagnosis Date Noted  . Syncope 04/20/2016  . Anxiety 01/09/2016  . DD (diverticular disease) 01/09/2016  . Bloodgood disease 01/09/2016  . Glaucoma 01/09/2016  . Hypercholesterolemia 01/09/2016  . 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 Menopausal osteoporosis 01/09/2016  . LBP (low back pain) 10/19/2014  . 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 Chronic obstructive pulmonary disease ([place]) 09/08/2014  . Esophagitis, reflux 09/08/2014  . Avitaminosis D 09/08/2014  . BP (high blood pressure) 06/06/2014  . Cephalalgia 03/30/2014  . Hand numbness 03/30/2014   Pura Spice, PT, DPT # [phone] 11/07/2017, 4:05 PM  [facility] [address] [place], [place], [address] Phone: [phone]   Fax:  [phone]  Name: KIMBERLYN QUIOCHO MRN: [id number] Date of Birth: 05/19/1941

## 2017-11-06 ENCOUNTER — Encounter: Payer: Self-pay | Admitting: Physical Therapy

## 2017-11-06 ENCOUNTER — Ambulatory Visit: Payer: Medicare Other | Admitting: Physical Therapy

## 2017-11-06 DIAGNOSIS — M6281 Muscle weakness (generalized): Secondary | ICD-10-CM | POA: Diagnosis not present

## 2017-11-06 DIAGNOSIS — R2681 Unsteadiness on feet: Secondary | ICD-10-CM

## 2017-11-06 DIAGNOSIS — R262 Difficulty in walking, not elsewhere classified: Secondary | ICD-10-CM

## 2017-11-07 NOTE — Therapy (Addendum)
[facility] [address] [place], [place], [address] Phone: [phone]   Fax:  [phone]  Physical Therapy Treatment  Patient Details  Name: Jessica Bowman MRN: [id number] Date of Birth: 12-Aug-1941 Referring Provider: Dr. Raechel Ache   Encounter Date: 11/06/2017  PT End of Session - 11/07/17 1716    Visit Number  7    Number of Visits  13    Date for PT Re-Evaluation  12/02/17    Authorization Type  G-Codes    Authorization - Visit Number  7    Authorization - Number of Visits  15    PT Start Time  1001    PT Stop Time  1100    PT Time Calculation (min)  59 min    Activity Tolerance  Patient tolerated treatment well;Patient limited by fatigue;No increased pain    Behavior During Therapy  WFL for tasks assessed/performed       Past Medical History:  Diagnosis Date  . Anxiety   . Cancer ([place])    skin ca on nose  . COPD (chronic obstructive pulmonary disease) ([place])    exacerbation 04/16/2017  . Diverticulosis   . Dyspnea    with exertion  . Fibrocystic breast disease   . GERD (gastroesophageal reflux disease)   . Glaucoma   . Hypercholesterolemia   . Hypertension   . IBS (irritable bowel syndrome)   . Migraines   . Osteoporosis   . Psoriasis   . Skin cancer of nose   . 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 Vitamin D deficiency     Past Surgical History:  Procedure Laterality Date  . APPENDECTOMY    . APPENDECTOMY    . BREAST BIOPSY Left    neg  . BREAST BIOPSY Right    neg  . CATARACT EXTRACTION W/PHACO Right 05/14/2017   Procedure: CATARACT EXTRACTION PHACO AND INTRAOCULAR LENS PLACEMENT ([place])  Right;  Surgeon: Leandrew Koyanagi, MD;  Location: [place];  Service: Ophthalmology;  Laterality: Right;  . CEREBRAL ANEURYSM REPAIR     aneurysm repair  . TONSILLECTOMY      There were no vitals filed for this visit.  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 Pt reports that balance and deconditioning has become an issues over the last few years since her husband died. Pt reports no falls recently. Medical Hx significant for HTN, COPD, and osteoporosis. Pt had visual deficits with diplopia following removal of an uninterrupted aneurysm in 2011.  Pt has "fainted" several times in the past, most recently in 2017. She hit her head, but did not seek medical treatment.    Patient Stated Goals  To feel more confident about balance/ decreased fear of falling    Currently in Pain?  No/denies         Pt. reports no new complaints.  Pt. trying to stay active but fatigues easily.  No pain reported.       Berg: 54/56   Treatment:  Neuro: //-bar balance tasks: Tandem walking (f/b)/ lateral walking/ braiding (min. To no UE assist).Step touches on 6" step/ Step ups 10x on L/R with no UE assist (CGA for safety/ cuing).Standing alt. UE/LE with mirror feedback (different speeds).  STS/ turning with head turns.Hallway tasks: alt. UE and LE with cuing for upright posture 45 feet x 2. Walking with head  turns/ letter reading (slight difficulty). Resisted walking with 1BTB 5x all 4-planes. Light to no UE assist in //-bars.Kneeling/ squatting on blue mat with PT assist (difficulty).    Nustep L5 10 min. B UE/LE.         Pts. vitals were stable t/o tx. session: O2 sat. 93% and HR 129 bpm.  Pt. limited with kneeling/ squating tasks on blue mat due to difficulty maintaining balance/ returning to standing posture.  Pt. has good balance noted during Berg balane assessment:  54/56.  Several short seated or standing rest breaks with LE generalized strengthening and higher level balance tasks.  Pt. will continue to benefit from skilled PT with focus on progressing to a more independent gym based/ HEP.      PT Long Term Goals - 11/07/17 1600      PT LONG TERM GOAL #1   Title   Patient will increase Berg Balance score to 56/56 points to demonstrate decreased fall risk during functional activities.    Baseline  54/56 on 12/18    Time  4    Period  Weeks    Status  Partially Met    Target Date  12/02/17      PT LONG TERM GOAL #2   Title  Patient will increase Functional Gait Assessment score to >25/30 as to reduce fall risk and improve dynamic gait safety with community ambulation.    Baseline  20/25    Time  4    Period  Weeks    Status  Partially Met    Target Date  12/02/17      PT LONG TERM GOAL #3   Title  Patient will increase ABC scale score >80% to demonstrate better functional mobility and better confidence with ADLs.     Baseline  70.6% on 12/18    Time  4    Period  Weeks    Status  Partially Met    Target Date  12/02/17      PT LONG TERM GOAL #4   Title  Patient will increase BLE gross strength to 4+/5 as to improve functional strength for independent gait, increased standing tolerance and increased ADL ability.    Baseline  B LE muscle strength grossly 4/5 MMT.      Time  4    Period  Weeks    Status  On-going    Target Date  12/02/17            Plan - 11/07/17 1717    Clinical Presentation  Evolving    Clinical Decision Making  Moderate    Rehab Potential  Good    Clinical Impairments Affecting Rehab Potential  Chronic issues, advanced age    PT Frequency  2x / week    PT Duration  4 weeks    PT Treatment/Interventions  ADLs/Self Care Home Management;Aquatic Therapy;Gait training;Stair training;Functional mobility training;Therapeutic activities;Neuromuscular re-education;Balance training;Therapeutic exercise;Patient/family education;Manual techniques;Energy conservation;Passive range of motion;Vestibular;Visual/perceptual remediation/compensation    PT Next Visit Plan  Progress higher level balance/ endurance ex.     PT Home Exercise Plan  Added balance progression on foam surface, hamstring stretch; Continued  4-way hip, bridge +  clamshell, Sensory adaptation with stance progression from romberg, to semi-tandem, to tandem while standing in a corner with a sturdy chair in front for safety.     Consulted and Agree with Plan of Care  Patient       Patient will benefit from skilled therapeutic intervention in order to improve  the following deficits and impairments:  Abnormal gait, Decreased activity tolerance, Cardiopulmonary status limiting activity, Decreased balance, Decreased coordination, Decreased mobility, Decreased safety awareness, Decreased strength, Difficulty walking, Hypermobility, Postural dysfunction, Improper body mechanics, Impaired perceived functional ability  Visit Diagnosis: Muscle weakness (generalized)  Difficulty in walking, not elsewhere classified  Unsteadiness on feet     Problem List Patient Active Problem List   Diagnosis Date Noted  . Syncope 04/20/2016  . Anxiety 01/09/2016  . DD (diverticular disease) 01/09/2016  . Bloodgood disease 01/09/2016  . Glaucoma 01/09/2016  . Hypercholesterolemia 01/09/2016  . 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 Menopausal osteoporosis 01/09/2016  . LBP (low back pain) 10/19/2014  . 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 Chronic obstructive pulmonary disease ([place]) 09/08/2014  . Esophagitis, reflux 09/08/2014  . Avitaminosis D 09/08/2014  . BP (high blood pressure) 06/06/2014  . Cephalalgia 03/30/2014  . Hand numbness 03/30/2014   Pura Spice, PT, DPT # [phone] 11/07/2017, 5:18 PM  [facility] [address] [place], [place], [address] Phone: [phone]   Fax:  [phone]  Name: Jessica Bowman MRN: [id number] Date of Birth: [birth year]

## 2017-11-13 ENCOUNTER — Encounter: Payer: Medicare Other | Admitting: Physical Therapy

## 2017-11-20 ENCOUNTER — Ambulatory Visit: Payer: Medicare Other | Attending: Internal Medicine | Admitting: Physical Therapy

## 2017-11-20 DIAGNOSIS — M6281 Muscle weakness (generalized): Secondary | ICD-10-CM | POA: Insufficient documentation

## 2017-11-20 DIAGNOSIS — R262 Difficulty in walking, not elsewhere classified: Secondary | ICD-10-CM | POA: Diagnosis present

## 2017-11-20 DIAGNOSIS — R2681 Unsteadiness on feet: Secondary | ICD-10-CM | POA: Insufficient documentation

## 2017-11-20 NOTE — Therapy (Signed)
[facility] [address] [place], [place], [address] Phone: [phone]   Fax:  [phone]  Physical Therapy Treatment  Patient Details  Name: Jessica Bowman MRN: [id number] Date of Birth: 17-Jan-1941 Referring Provider: Dr. Raechel Ache   Encounter Date: 11/20/2017  PT End of Session - 11/22/17 0928    Visit Number  8    Number of Visits  13    Date for PT Re-Evaluation  12/02/17    PT Start Time  1004    PT Stop Time  1100    PT Time Calculation (min)  56 min    Equipment Utilized During Treatment  Gait belt    Activity Tolerance  Patient tolerated treatment well;Patient limited by fatigue;No increased pain    Behavior During Therapy  WFL for tasks assessed/performed       Past Medical History:  Diagnosis Date  . Anxiety   . Cancer ([place])    skin ca on nose  . COPD (chronic obstructive pulmonary disease) ([place])    exacerbation 04/16/2017  . Diverticulosis   . Dyspnea    with exertion  . Fibrocystic breast disease   . GERD (gastroesophageal reflux disease)   . Glaucoma   . Hypercholesterolemia   . Hypertension   . IBS (irritable bowel syndrome)   . Migraines   . Osteoporosis   . Psoriasis   . Skin cancer of nose   . 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 Vitamin D deficiency     Past Surgical History:  Procedure Laterality Date  . APPENDECTOMY    . APPENDECTOMY    . BREAST BIOPSY Left    neg  . BREAST BIOPSY Right    neg  . CATARACT EXTRACTION W/PHACO Right 05/14/2017   Procedure: CATARACT EXTRACTION PHACO AND INTRAOCULAR LENS PLACEMENT ([employer])  Right;  Surgeon: Leandrew Koyanagi, MD;  Location: [place];  Service: Ophthalmology;  Laterality: Right;  . CEREBRAL ANEURYSM REPAIR     aneurysm repair  . TONSILLECTOMY      There were no vitals filed for this visit.  Subjective Assessment - 11/22/17 0927    Subjective  Pt. states she has no energy and some difficulty breathing today.  Pt. tired over  Christmas/New Years entertaining and cooking for family.  Pt. used inhaler prior to therex. today.  O2 sat: 94%.      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 Pt reports that balance and deconditioning has become an issues over the last few years since her husband died. Pt reports no falls recently. Medical Hx significant for HTN, COPD, and osteoporosis. Pt had visual deficits with diplopia following removal of an uninterrupted aneurysm in 2011.  Pt has "fainted" several times in the past, most recently in 2017. She hit her head, but did not seek medical treatment.    Patient Stated Goals  To feel more confident about balance/ decreased fear of falling    Currently in Pain?  No/denies         Treatment:   Nustep L6 10 min. B UE/LE.   Neuro: //-bar balance tasks: Tandem walking (f/b)/ lateral walking/ braiding (min. To no UE assist).Step touches on 6" step/ Step ups 10x on L/R with no UE assist (CGA for safety/ cuing).Standing alt. UE/LE with mirror feedback (different speeds).  STS/ turning with head turns.Hallway tasks:  Braiding/ cone touches in hallway/ dual tasking with letters.  Standing wobble board at end of //-bars (light UE assist).   Walking in clinic with cuing to BOS/ step pattern/ upright posture working on controlled breathing.  Several short seated rest breaks.        PT Long Term Goals - 11/07/17 1600      PT LONG TERM GOAL #1   Title  Patient will increase Berg Balance score to 56/56 points to demonstrate decreased fall risk during functional activities.    Baseline  54/56 on 12/18    Time  4    Period  Weeks    Status  Partially Met    Target Date  12/02/17      PT LONG TERM GOAL #2   Title  Patient will increase Functional Gait Assessment score to >25/30 as to reduce fall risk and improve dynamic gait safety with community ambulation.    Baseline  20/25    Time  4    Period  Weeks     Status  Partially Met    Target Date  12/02/17      PT LONG TERM GOAL #3   Title  Patient will increase ABC scale score >80% to demonstrate better functional mobility and better confidence with ADLs.     Baseline  70.6% on 12/18    Time  4    Period  Weeks    Status  Partially Met    Target Date  12/02/17      PT LONG TERM GOAL #4   Title  Patient will increase BLE gross strength to 4+/5 as to improve functional strength for independent gait, increased standing tolerance and increased ADL ability.    Baseline  B LE muscle strength grossly 4/5 MMT.      Time  4    Period  Weeks    Status  On-going    Target Date  12/02/17            Plan - 11/22/17 0930    Clinical Impression Statement  Pt. easily fatigued with standing dynamic balance tasks/ gait in clinic.  Pts. vitals remain in normal range for pt. and PT encourages pt. to control breathing/ take rest breaks as needed.  No pain or LOB during tx. session but balance/ safety limited by increase c/o fatigue.  No change to HEP and PT encourages pt. to look into gym based ex. at [place].        Clinical Presentation  Evolving    Clinical Decision Making  Moderate    Rehab Potential  Good    Clinical Impairments Affecting Rehab Potential  Chronic issues, advanced age    PT Frequency  2x / week    PT Duration  4 weeks    PT Treatment/Interventions  ADLs/Self Care Home Management;Aquatic Therapy;Gait training;Stair training;Functional mobility training;Therapeutic activities;Neuromuscular re-education;Balance training;Therapeutic exercise;Patient/family education;Manual techniques;Energy conservation;Passive range of motion;Vestibular;Visual/perceptual remediation/compensation       Patient will benefit from skilled therapeutic intervention in order to improve the following deficits and impairments:  Abnormal gait, Decreased activity tolerance, Cardiopulmonary status limiting activity, Decreased balance, Decreased  coordination, Decreased mobility, Decreased safety awareness, Decreased strength, Difficulty walking, Hypermobility, Postural dysfunction, Improper body mechanics, Impaired perceived functional ability  Visit Diagnosis: Muscle weakness (generalized)  Difficulty in walking, not elsewhere classified  Unsteadiness on feet     Problem List Patient Active Problem List   Diagnosis Date Noted  . Syncope 04/20/2016  . Anxiety 01/09/2016  . DD (diverticular disease) 01/09/2016  . Bloodgood disease  01/09/2016  . Glaucoma 01/09/2016  . Hypercholesterolemia 01/09/2016  . 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 Menopausal osteoporosis 01/09/2016  . LBP (low back pain) 10/19/2014  . 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 Chronic obstructive pulmonary disease ([place]) 09/08/2014  . Esophagitis, reflux 09/08/2014  . Avitaminosis D 09/08/2014  . BP (high blood pressure) 06/06/2014  . Cephalalgia 03/30/2014  . Hand numbness 03/30/2014   Pura Spice, PT, DPT # [phone] 11/22/2017, 9:35 AM  [place] [facility] [address] [place], [place], [address] Phone: [phone]   Fax:  [phone]  Name: Jessica Bowman MRN: [id number] Date of Birth: 04-12-1941

## 2017-11-22 ENCOUNTER — Encounter: Payer: Self-pay | Admitting: Physical Therapy

## 2017-11-25 ENCOUNTER — Encounter: Payer: Self-pay | Admitting: Physical Therapy

## 2017-11-25 ENCOUNTER — Ambulatory Visit: Payer: Medicare Other | Admitting: Physical Therapy

## 2017-11-25 ENCOUNTER — Encounter: Payer: Medicare Other | Admitting: Physical Therapy

## 2017-11-25 DIAGNOSIS — M6281 Muscle weakness (generalized): Secondary | ICD-10-CM

## 2017-11-25 DIAGNOSIS — R2681 Unsteadiness on feet: Secondary | ICD-10-CM

## 2017-11-25 DIAGNOSIS — R262 Difficulty in walking, not elsewhere classified: Secondary | ICD-10-CM

## 2017-11-25 NOTE — Therapy (Signed)
[place] [facility] [address] [place], [place], [address] Phone: [phone]   Fax:  [phone]  Physical Therapy Treatment  Patient Details  Name: Jessica Bowman MRN: [id number] Date of Birth: [birth year] Referring Provider: Dr. Raechel Ache   Encounter Date: 11/25/2017     Pt. reports she was fairly inactive this past weekend.  No new complaints.     Past Medical History:  Diagnosis Date  . Anxiety   . Cancer ([place])    skin ca on nose  . COPD (chronic obstructive pulmonary disease) ([place])    exacerbation 04/16/2017  . Diverticulosis   . Dyspnea    with exertion  . Fibrocystic breast disease   . GERD (gastroesophageal reflux disease)   . Glaucoma   . Hypercholesterolemia   . Hypertension   . IBS (irritable bowel syndrome)   . Migraines   . Osteoporosis   . Psoriasis   . Skin cancer of nose   . 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 Vitamin D deficiency     Past Surgical History:  Procedure Laterality Date  . APPENDECTOMY    . APPENDECTOMY    . BREAST BIOPSY Left    neg  . BREAST BIOPSY Right    neg  . CATARACT EXTRACTION W/PHACO Right 05/14/2017   Procedure: CATARACT EXTRACTION PHACO AND INTRAOCULAR LENS PLACEMENT ([place])  Right;  Surgeon: Leandrew Koyanagi, MD;  Location: [place];  Service: Ophthalmology;  Laterality: Right;  . CEREBRAL ANEURYSM REPAIR     aneurysm repair  . TONSILLECTOMY      There were no vitals filed for this visit.    Treatment:   Nustep L6 5 min. X 2 (short rest break to check vitals).   Nautilus: 30# lat. Pull downs/ scap. Retraction 20x.   Neuro:  RTB hip flexion/ forward walking/ backwards walking/ lateral walking/ seated clamshells 10x2 each.  Good technique with light UE assist in //-bars for safety.  Airex: NBOS/ head turning/ tandem (>10 sec.)- easier with L leg in back.  Hallway activities: walking with increase BOS/ dual tasking with added head turns.             Pt. continues to require frequent rest breaks esp. with standing/ higher level balance tasks.  No c/o pain and O2 sat. remains >93% t/o tx. session.  B hip flexor muscle weakness noted during seated/ standing hip flexion.  No LOB or mis-steps during hallway walking/ dual-tasking.         PT Long Term Goals - 11/07/17 1600      PT LONG TERM GOAL #1   Title  Patient will increase Berg Balance score to 56/56 points to demonstrate decreased fall risk during functional activities.    Baseline  54/56 on 12/18    Time  4    Period  Weeks    Status  Partially Met    Target Date  12/02/17      PT LONG TERM GOAL #2   Title  Patient will increase Functional Gait Assessment score to >25/30 as to reduce fall risk and improve dynamic gait safety with community ambulation.    Baseline  20/25    Time  4    Period  Weeks    Status  Partially Met    Target Date  12/02/17      PT LONG TERM GOAL #3   Title  Patient will increase ABC scale score >80% to demonstrate better functional  mobility and better confidence with ADLs.     Baseline  70.6% on 12/18    Time  4    Period  Weeks    Status  Partially Met    Target Date  12/02/17      PT LONG TERM GOAL #4   Title  Patient will increase BLE gross strength to 4+/5 as to improve functional strength for independent gait, increased standing tolerance and increased ADL ability.    Baseline  B LE muscle strength grossly 4/5 MMT.      Time  4    Period  Weeks    Status  On-going    Target Date  12/02/17        Patient will benefit from skilled therapeutic intervention in order to improve the following deficits and impairments:  Abnormal gait, Decreased activity tolerance, Cardiopulmonary status limiting activity, Decreased balance, Decreased coordination, Decreased mobility, Decreased safety awareness, Decreased strength, Difficulty walking, Hypermobility, Postural dysfunction, Improper body mechanics, Impaired perceived functional  ability  Visit Diagnosis: Muscle weakness (generalized)  Difficulty in walking, not elsewhere classified  Unsteadiness on feet     Problem List Patient Active Problem List   Diagnosis Date Noted  . Syncope 04/20/2016  . Anxiety 01/09/2016  . DD (diverticular disease) 01/09/2016  . Bloodgood disease 01/09/2016  . Glaucoma 01/09/2016  . Hypercholesterolemia 01/09/2016  . 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 Menopausal osteoporosis 01/09/2016  . LBP (low back pain) 10/19/2014  . 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 Chronic obstructive pulmonary disease ([place]) 09/08/2014  . Esophagitis, reflux 09/08/2014  . Avitaminosis D 09/08/2014  . BP (high blood pressure) 06/06/2014  . Cephalalgia 03/30/2014  . Hand numbness 03/30/2014   Pura Spice, PT, DPT # [phone] 11/27/2017, 1:36 PM  [place] [facility] [address] [place], [place], [address] Phone: [phone]   Fax:  [phone]  Name: NIALA STCHARLES MRN: [id number] Date of Birth: 02/28/1941

## 2017-11-27 ENCOUNTER — Ambulatory Visit: Payer: Medicare Other | Admitting: Physical Therapy

## 2017-11-27 ENCOUNTER — Encounter: Payer: Self-pay | Admitting: Physical Therapy

## 2017-11-27 VITALS — HR 121

## 2017-11-27 DIAGNOSIS — R2681 Unsteadiness on feet: Secondary | ICD-10-CM

## 2017-11-27 DIAGNOSIS — M6281 Muscle weakness (generalized): Secondary | ICD-10-CM

## 2017-11-27 DIAGNOSIS — R262 Difficulty in walking, not elsewhere classified: Secondary | ICD-10-CM

## 2017-11-27 NOTE — Therapy (Signed)
[place] [facility] [address] [place], [place], [address] Phone: [phone]   Fax:  [phone]  Physical Therapy Treatment  Patient Details  Name: Jessica Bowman MRN: [id number] Date of Birth: 1941-01-01 Referring Provider: Dr. Raechel Ache   Encounter Date: 11/27/2017  PT End of Session - 11/27/17 1459    Visit Number  10    Number of Visits  13    Date for PT Re-Evaluation  12/02/17    PT Start Time  8453    PT Stop Time  1450    PT Time Calculation (min)  65 min    Activity Tolerance  Patient tolerated treatment well;No increased pain    Behavior During Therapy  WFL for tasks assessed/performed       Past Medical History:  Diagnosis Date  . Anxiety   . Cancer ([place])    skin ca on nose  . COPD (chronic obstructive pulmonary disease) ([place])    exacerbation 04/16/2017  . Diverticulosis   . Dyspnea    with exertion  . Fibrocystic breast disease   . GERD (gastroesophageal reflux disease)   . Glaucoma   . Hypercholesterolemia   . Hypertension   . IBS (irritable bowel syndrome)   . Migraines   . Osteoporosis   . Psoriasis   . Skin cancer of nose   . 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 Vitamin D deficiency     Past Surgical History:  Procedure Laterality Date  . APPENDECTOMY    . APPENDECTOMY    . BREAST BIOPSY Left    neg  . BREAST BIOPSY Right    neg  . CATARACT EXTRACTION W/PHACO Right 05/14/2017   Procedure: CATARACT EXTRACTION PHACO AND INTRAOCULAR LENS PLACEMENT ([place])  Right;  Surgeon: Leandrew Koyanagi, MD;  Location: [facility];  Service: Ophthalmology;  Laterality: Right;  . CEREBRAL ANEURYSM REPAIR     aneurysm repair  . TONSILLECTOMY      Vitals:   11/27/17 1357  Pulse: (!) 121  SpO2: 94%    Subjective Assessment - 11/27/17 1353    Subjective  Pt. reports inactivity since last PT visit and "feeling fine".     Pertinent History  77 y/o female presents to therapy with referral for sensory  ataxia and deconditioning. Pt reports that balance and deconditioning has become an issues over the last few years since her husband died. Pt reports no falls recently. Medical Hx significant for HTN, COPD, and osteoporosis. Pt had visual deficits with diplopia following removal of an uninterrupted aneurysm in 2011.  Pt has "fainted" several times in the past, most recently in 2017. She hit her head, but did not seek medical treatment.    Patient Stated Goals  To feel more confident about balance/ decreased fear of falling    Currently in Pain?  No/denies          Treatment:  Nustep L677mn. B UE/LE (warm-up/no charge).   There.ex: 2 laps around gym walking (discussed breathing/ posture).  Nautlius:  Resisted gait 2 sets #40: backwards/forward walking, lateral side stepping Bilateral. Standing 30# scapular rows with handles 20x, lat pull down, 20# triceps pull down. Stair climbing 4 steps 5 reps (B light UE assist on handrails).   Seated LAQ during 1 of the rest breaks 20x.    Neuro: //-bars balance tasks on airex balance beam: Tandem walking, lateral walking, crossover walking(min to no UE assist with SBA to CGA for safety). /Glennie Hawk  6" step up bilateral x4. 180 degree quick turn. (no UE assist, no gaurding).       PT Education - 11/27/17 1459    Education provided  Yes    Education Details  Monster walks with [employer]) Educated  Patient    Methods  Explanation;Demonstration;Handout    Comprehension  Returned demonstration;Verbalized understanding          PT Long Term Goals - 11/07/17 1600      PT LONG TERM GOAL #1   Title  Patient will increase Berg Balance score to 56/56 points to demonstrate decreased fall risk during functional activities.    Baseline  54/56 on 12/18    Time  4    Period  Weeks    Status  Partially Met    Target Date  12/02/17      PT LONG TERM GOAL #2   Title  Patient will increase Functional Gait Assessment score to >25/30 as to reduce  fall risk and improve dynamic gait safety with community ambulation.    Baseline  20/25    Time  4    Period  Weeks    Status  Partially Met    Target Date  12/02/17      PT LONG TERM GOAL #3   Title  Patient will increase ABC scale score >80% to demonstrate better functional mobility and better confidence with ADLs.     Baseline  70.6% on 12/18    Time  4    Period  Weeks    Status  Partially Met    Target Date  12/02/17      PT LONG TERM GOAL #4   Title  Patient will increase BLE gross strength to 4+/5 as to improve functional strength for independent gait, increased standing tolerance and increased ADL ability.    Baseline  B LE muscle strength grossly 4/5 MMT.      Time  4    Period  Weeks    Status  On-going    Target Date  12/02/17            Plan - 11/27/17 1503    Clinical Impression Statement  Pt. needs frequent rest breaks with cardio intensive tasks like stair climbing. No concern with pain, but muscle fatigue in quads with knee extension exercises and hip fatigue with abduction exercise. O2 remains above >91% with task like walking and stairs. Hip abduction weakness noted in eccentric portion of stair climbing. Upper body strength WNL. Balance on uneven surface air-ex pad easy (no UE assist) with lateral stepping, and difficulty (extra time) with crossover stepping and tandem gait.   PT will reassess goals next tx. session.      Clinical Presentation  Evolving    Clinical Decision Making  Moderate    Rehab Potential  Good    Clinical Impairments Affecting Rehab Potential  Chronic issues, advanced age    PT Frequency  2x / week    PT Duration  4 weeks    PT Treatment/Interventions  ADLs/Self Care Home Management;Aquatic Therapy;Gait training;Stair training;Functional mobility training;Therapeutic activities;Neuromuscular re-education;Balance training;Therapeutic exercise;Patient/family education;Manual techniques;Energy conservation;Passive range of  motion;Vestibular;Visual/perceptual remediation/compensation    PT Next Visit Plan  Reasses goals. Progress higher level balance/ endurance ex. Core strengthening     PT Home Exercise Plan  Added monster walk wit GTB for hip abduction strength.    Consulted and Agree with Plan of Care  Patient       Patient  will benefit from skilled therapeutic intervention in order to improve the following deficits and impairments:  Abnormal gait, Decreased activity tolerance, Cardiopulmonary status limiting activity, Decreased balance, Decreased coordination, Decreased mobility, Decreased safety awareness, Decreased strength, Difficulty walking, Hypermobility, Postural dysfunction, Improper body mechanics, Impaired perceived functional ability, Decreased endurance  Visit Diagnosis: Muscle weakness (generalized)  Difficulty in walking, not elsewhere classified  Unsteadiness on feet     Problem List Patient Active Problem List   Diagnosis Date Noted  . Syncope 04/20/2016  . Anxiety 01/09/2016  . DD (diverticular disease) 01/09/2016  . Bloodgood disease 01/09/2016  . Glaucoma 01/09/2016  . Hypercholesterolemia 01/09/2016  . 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 Menopausal osteoporosis 01/09/2016  . LBP (low back pain) 10/19/2014  . 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 Chronic obstructive pulmonary disease ([place]) 09/08/2014  . Esophagitis, reflux 09/08/2014  . Avitaminosis D 09/08/2014  . BP (high blood pressure) 06/06/2014  . Cephalalgia 03/30/2014  . Hand numbness 03/30/2014   Pura Spice, PT, DPT # [phone] 11/27/2017, 5:16 PM  [place] [facility] [address] [place], [place], [address] Phone: [phone]   Fax:  [phone]  Name: Jessica Bowman MRN: [id number] Date of Birth: 06/16/1941

## 2017-12-02 ENCOUNTER — Encounter: Payer: Self-pay | Admitting: Physical Therapy

## 2017-12-02 ENCOUNTER — Ambulatory Visit: Payer: Medicare Other | Admitting: Physical Therapy

## 2017-12-02 DIAGNOSIS — R2681 Unsteadiness on feet: Secondary | ICD-10-CM

## 2017-12-02 DIAGNOSIS — M6281 Muscle weakness (generalized): Secondary | ICD-10-CM | POA: Diagnosis not present

## 2017-12-02 DIAGNOSIS — R262 Difficulty in walking, not elsewhere classified: Secondary | ICD-10-CM

## 2017-12-02 NOTE — Therapy (Signed)
[place] [facility] [address] [place], [place], [address] Phone: [phone]   Fax:  [phone]  Physical Therapy Treatment  Patient Details  Name: Jessica Bowman MRN: [id number] Date of Birth: 05/17/1941 Referring Provider: Dr. Raechel Ache   Encounter Date: 12/02/2017  PT End of Session - 12/02/17 1518    Visit Number  11    Number of Visits  15    Date for PT Re-Evaluation  12/30/17    PT Start Time  1347    PT Stop Time  1500    PT Time Calculation (min)  73 min    Activity Tolerance  Patient tolerated treatment well;Patient limited by fatigue    Behavior During Therapy  [facility] for tasks assessed/performed       Past Medical History:  Diagnosis Date  . Anxiety   . Cancer ([place])    skin ca on nose  . COPD (chronic obstructive pulmonary disease) ([place])    exacerbation 04/16/2017  . Diverticulosis   . Dyspnea    with exertion  . Fibrocystic breast disease   . GERD (gastroesophageal reflux disease)   . Glaucoma   . Hypercholesterolemia   . Hypertension   . IBS (irritable bowel syndrome)   . Migraines   . Osteoporosis   . Psoriasis   . Skin cancer of nose   . 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 Vitamin D deficiency     Past Surgical History:  Procedure Laterality Date  . APPENDECTOMY    . APPENDECTOMY    . BREAST BIOPSY Left    neg  . BREAST BIOPSY Right    neg  . CATARACT EXTRACTION W/PHACO Right 05/14/2017   Procedure: CATARACT EXTRACTION PHACO AND INTRAOCULAR LENS PLACEMENT ([place])  Right;  Surgeon: Leandrew Koyanagi, MD;  Location: [facility];  Service: Ophthalmology;  Laterality: Right;  . CEREBRAL ANEURYSM REPAIR     aneurysm repair  . TONSILLECTOMY      There were no vitals filed for this visit.  Subjective Assessment - 12/02/17 1518    Subjective  Pt. states she has been feeling "great" but tired, and compliant with HEP. Pt. says she has been doing thera band monster walks in hallway, as well as  lateral walks with no problem. However, she has been experiencing "cramping" in her R post LE with glute bridges.    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 Pt reports that balance and deconditioning has become an issues over the last few years since her husband died. Pt reports no falls recently. Medical Hx significant for HTN, COPD, and osteoporosis. Pt had visual deficits with diplopia following removal of an uninterrupted aneurysm in 2011.  Pt has "fainted" several times in the past, most recently in 2017. She hit her head, but did not seek medical treatment.    Patient Stated Goals  To feel more confident about balance/ decreased fear of falling    Currently in Pain?  No/denies          There.ex:  Nustep warm up (5 min x2 no charge).  O2 sat: 93%.  HR: 125 bpm.   Stair climb no UE assist 4 steps x4 in 46 seconds. (cardio taxing for pt.)  Wall squat with yellow ball behind back for assist 2 sets x10. (mild right lateral hip shift) Nautilus: alternated standing scapular row 30# x20, and chest press out 20# 2 sets  x10.   Marked fatigue noted.   Neuro.: Single leg stance L 8 sec / R >10 sec Tandem stance  L 20 sec / R>30sec Reciprocal touch on 6" step >8 in 20sec  //-bars obstacle with 1 hip flexor step over 3", 3-4 tandem walk steps, 1 step-up 6", and 180 degree quick turn. x7   MMT:(L/R) Hip flexor(4/-4) Hip abd(5/5) Quad(5/5) Hamstring(4/4)       PT Long Term Goals - 12/02/17 1520      PT LONG TERM GOAL #1   Title  Patient will increase Berg Balance score to 56/56 points to demonstrate decreased fall risk during functional activities.    Baseline  56/56    Time  4    Period  Weeks    Status  Achieved    Target Date  12/30/17      PT LONG TERM GOAL #2   Title  Patient will increase Functional Gait Assessment score to >25/30 as to reduce fall risk and improve dynamic gait safety with community  ambulation.    Baseline  20/25    Time  4    Period  Weeks    Status  Partially Met    Target Date  12/30/17      PT LONG TERM GOAL #3   Title  Patient will increase ABC scale score >80% to demonstrate better functional mobility and better confidence with ADLs.     Baseline  70.6% on 12/18.  75% on 12/02/17    Time  4    Period  Weeks    Status  Partially Met    Target Date  12/30/17      PT LONG TERM GOAL #4   Title  Patient will increase BLE gross strength to 4+/5 as to improve functional strength for independent gait, increased standing tolerance and increased ADL ability.    Baseline  Pt. hip abd and quad strength 5/5 MMT. Hip flex and hamstring still 4/5 MMT (particuarly still weak on R hip flexor).    Time  4    Period  Weeks    Status  Partially Met    Target Date  12/30/17      PT LONG TERM GOAL #5   Title  Pt. will participate with a gym based ex. at [employer] with no increase c/o pain or functional limitations to promote discharge from PT.      Baseline  pt. planning on starting with [employer] in next 1-2 weeks.      Time  4    Period  Weeks    Status  New    Target Date  12/30/17            Plan - 12/02/17 1519    Clinical Impression Statement  Pt. continues to improve her cardio capacity, but still very limited with higher level tasks, such as stair climbing and wall squats.  Balance improvments shown today on reciprocal  touch on 6" step, tandem stance, and single leg stance. Hip abduction and quad strength 5/5 MMT, while hip flex and hamstring strength still remain in the 4/5 MMT range.  Pt. will continue to benefit from PT for cardiovascular training to improve community ambulation and quality of life, and LE strength training to improve balance and diminish fall risk.     Clinical Presentation  Evolving    Clinical Decision Making  Moderate    Rehab Potential  Good    Clinical Impairments Affecting Rehab Potential  Chronic issues, advanced  age    PT  Frequency  2x / week    PT Duration  4 weeks    PT Treatment/Interventions  ADLs/Self Care Home Management;Aquatic Therapy;Gait training;Stair training;Functional mobility training;Therapeutic activities;Neuromuscular re-education;Balance training;Therapeutic exercise;Patient/family education;Manual techniques;Energy conservation;Passive range of motion;Vestibular;Visual/perceptual remediation/compensation    PT Next Visit Plan  Discuss gym based exercise.  Check to see if pt. has membership to [employer].      PT Home Exercise Plan  Added monster walk wit GTB for hip abduction strength.    Consulted and Agree with Plan of Care  Patient       Patient will benefit from skilled therapeutic intervention in order to improve the following deficits and impairments:  Abnormal gait, Decreased activity tolerance, Cardiopulmonary status limiting activity, Decreased balance, Decreased coordination, Decreased mobility, Decreased safety awareness, Decreased strength, Difficulty walking, Hypermobility, Postural dysfunction, Improper body mechanics, Impaired perceived functional ability, Decreased endurance  Visit Diagnosis: Muscle weakness (generalized)  Difficulty in walking, not elsewhere classified  Unsteadiness on feet     Problem List Patient Active Problem List   Diagnosis Date Noted  . Syncope 04/20/2016  . Anxiety 01/09/2016  . DD (diverticular disease) 01/09/2016  . Bloodgood disease 01/09/2016  . Glaucoma 01/09/2016  . Hypercholesterolemia 01/09/2016  . 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 Menopausal osteoporosis 01/09/2016  . LBP (low back pain) 10/19/2014  . 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 Chronic obstructive pulmonary disease ([place]) 09/08/2014  . Esophagitis, reflux 09/08/2014  . Avitaminosis D 09/08/2014  . BP (high blood pressure) 06/06/2014  . Cephalalgia 03/30/2014  . Hand numbness 03/30/2014   Pura Spice, PT, DPT # [phone] 12/03/2017, 9:17 AM  [place] [facility] [address] [place], [place], [address] Phone: [phone]   Fax:  [phone]  Name: Jessica Bowman MRN: [id number] Date of Birth: [birth year]

## 2017-12-08 NOTE — Addendum Note (Signed)
Addended by: Pura Spice on: 12/08/2017 08:20 AM   Modules accepted: Orders

## 2017-12-09 ENCOUNTER — Encounter: Payer: Medicare Other | Admitting: Physical Therapy

## 2017-12-16 ENCOUNTER — Encounter: Payer: Self-pay | Admitting: Physical Therapy

## 2017-12-16 ENCOUNTER — Encounter: Payer: Medicare Other | Admitting: Physical Therapy

## 2017-12-16 ENCOUNTER — Ambulatory Visit: Payer: Medicare Other | Admitting: Physical Therapy

## 2017-12-16 DIAGNOSIS — R262 Difficulty in walking, not elsewhere classified: Secondary | ICD-10-CM

## 2017-12-16 DIAGNOSIS — R2681 Unsteadiness on feet: Secondary | ICD-10-CM

## 2017-12-16 DIAGNOSIS — M6281 Muscle weakness (generalized): Secondary | ICD-10-CM | POA: Diagnosis not present

## 2017-12-16 NOTE — Therapy (Signed)
[facility] [address] [place], [place], [address] Phone: [phone]   Fax:  [phone]  Physical Therapy Treatment  Patient Details  Name: Jessica Bowman MRN: [id number] Date of Birth: 07/15/1941 Referring Provider: Dr. Thies   Encounter Date: 12/16/2017    Treatment 12 of 15.  Recert date: 12/30/17   Past Medical History:  Diagnosis Date  . Anxiety   . Cancer (HCC)    skin ca on nose  . COPD (chronic obstructive pulmonary disease) (HCC)    exacerbation 04/16/2017  . Diverticulosis   . Dyspnea    with exertion  . Fibrocystic breast disease   . GERD (gastroesophageal reflux disease)   . Glaucoma   . Hypercholesterolemia   . Hypertension   . IBS (irritable bowel syndrome)   . Migraines   . Osteoporosis   . Psoriasis   . Skin cancer of nose   . 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 Vitamin D deficiency     Past Surgical History:  Procedure Laterality Date  . APPENDECTOMY    . APPENDECTOMY    . BREAST BIOPSY Left    neg  . BREAST BIOPSY Right    neg  . CATARACT EXTRACTION W/PHACO Right 05/14/2017   Procedure: CATARACT EXTRACTION PHACO AND INTRAOCULAR LENS PLACEMENT (IOC)  Right;  Surgeon: Brasington, Chadwick, MD;  Location: [facility];  Service: Ophthalmology;  Laterality: Right;  . CEREBRAL ANEURYSM REPAIR     aneurysm repair  . TONSILLECTOMY      There were no vitals filed for this visit.    Pt. states she cancelled last week secondary to nausea.  Pt. reports difficulty with breathing over past 5 days.  No c/o pain at this time.       Treatment:   There.ex:   Nustep L6 (5 min x 2)- warm-up/no charge.  O2 sat: 94%.  HR: 118 bpm.   Stair climb with light UE assist 4 steps x 5. (increase fatigue/ min. Dizziness).  GTB lateral walking/ 3-way hip ex.. Nautilus: standing scapular row 30# x10, and 20# bicep curls x10 and 20# tricep ext x10. Marked fatigue noted.   Neuro.:  Airex: NBOS/  tandem stance/ tandem gait on beam (forward/lateral)/ SLS with min. Use of //-bars for touch reference/ safety.   //-bars obstacle with step touches/overs 3", 3-4 tandem walk steps, 1 step-up 6", and 180 degree quick turn. X6.     Pts. vitals remains WNL for pt. during standing/walking ther.ex.  Pt. reports no new issues at home but continues to be limited by poor endurance and LE weakness.  No LOB with everyday household tasks.  Moderate cuing to correct upright posture, esp during increase fatigue with standing/balance tasks.      PT Long Term Goals - 12/02/17 1520      PT LONG TERM GOAL #1   Title  Patient will increase Berg Balance score to 56/56 points to demonstrate decreased fall risk during functional activities.    Baseline  56/56    Time  4    Period  Weeks    Status  Achieved    Target Date  12/30/17      PT LONG TERM GOAL #2   Title  Patient will increase Functional Gait Assessment score to >25/30 as to reduce fall risk and improve dynamic gait safety with community ambulation.    Baseline  20/25    Time  4    Period    Weeks    Status  Partially Met    Target Date  12/30/17      PT LONG TERM GOAL #3   Title  Patient will increase ABC scale score >80% to demonstrate better functional mobility and better confidence with ADLs.     Baseline  70.6% on 12/18.  75% on 12/02/17    Time  4    Period  Weeks    Status  Partially Met    Target Date  12/30/17      PT LONG TERM GOAL #4   Title  Patient will increase BLE gross strength to 4+/5 as to improve functional strength for independent gait, increased standing tolerance and increased ADL ability.    Baseline  Pt. hip abd and quad strength 5/5 MMT. Hip flex and hamstring still 4/5 MMT (particuarly still weak on R hip flexor).    Time  4    Period  Weeks    Status  Partially Met    Target Date  12/30/17      PT LONG TERM GOAL #5   Title  Pt. will participate with a gym based ex. at [employer] with no increase c/o  pain or functional limitations to promote discharge from PT.      Baseline  pt. planning on starting with [employer] in next 1-2 weeks.      Time  4    Period  Weeks    Status  New    Target Date  12/30/17              Patient will benefit from skilled therapeutic intervention in order to improve the following deficits and impairments:  Abnormal gait, Decreased activity tolerance, Cardiopulmonary status limiting activity, Decreased balance, Decreased coordination, Decreased mobility, Decreased safety awareness, Decreased strength, Difficulty walking, Hypermobility, Postural dysfunction, Improper body mechanics, Impaired perceived functional ability, Decreased endurance  Visit Diagnosis: Muscle weakness (generalized)  Difficulty in walking, not elsewhere classified  Unsteadiness on feet     Problem List Patient Active Problem List   Diagnosis Date Noted  . Syncope 04/20/2016  . Anxiety 01/09/2016  . DD (diverticular disease) 01/09/2016  . Bloodgood disease 01/09/2016  . Glaucoma 01/09/2016  . Hypercholesterolemia 01/09/2016  . 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 Menopausal osteoporosis 01/09/2016  . LBP (low back pain) 10/19/2014  . 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 Chronic obstructive pulmonary disease (HCC) 09/08/2014  . Esophagitis, reflux 09/08/2014  . Avitaminosis D 09/08/2014  . BP (high blood pressure) 06/06/2014  . Cephalalgia 03/30/2014  . Hand numbness 03/30/2014   Michael C Sherk, PT, DPT # [id number] 12/18/2017, 1:02 PM  [facility] [address] [place], [place], [address] Phone: [phone]   Fax:  [phone]  Name: Jessica Bowman MRN: [id number] Date of Birth: 11/14/1941   

## 2017-12-23 ENCOUNTER — Encounter: Payer: Medicare Other | Admitting: Physical Therapy

## 2017-12-30 ENCOUNTER — Encounter: Payer: Medicare Other | Admitting: Physical Therapy

## 2018-01-02 ENCOUNTER — Other Ambulatory Visit: Payer: Self-pay | Admitting: Specialist

## 2018-01-02 DIAGNOSIS — R0602 Shortness of breath: Secondary | ICD-10-CM

## 2018-01-02 DIAGNOSIS — J432 Centrilobular emphysema: Secondary | ICD-10-CM

## 2018-01-15 ENCOUNTER — Ambulatory Visit
Admission: RE | Admit: 2018-01-15 | Discharge: 2018-01-15 | Disposition: A | Discharge status: Home / Self Care | Payer: Medicare Other | Source: Ambulatory Visit | Attending: Specialist | Admitting: Specialist

## 2018-01-15 DIAGNOSIS — J439 Emphysema, unspecified: Secondary | ICD-10-CM | POA: Diagnosis not present

## 2018-01-15 DIAGNOSIS — J432 Centrilobular emphysema: Secondary | ICD-10-CM | POA: Diagnosis present

## 2018-01-15 DIAGNOSIS — D3502 Benign neoplasm of left adrenal gland: Secondary | ICD-10-CM | POA: Diagnosis not present

## 2018-01-15 DIAGNOSIS — I2510 Atherosclerotic heart disease of native coronary artery without angina pectoris: Secondary | ICD-10-CM | POA: Diagnosis not present

## 2018-01-15 DIAGNOSIS — R0602 Shortness of breath: Secondary | ICD-10-CM | POA: Diagnosis present

## 2018-01-15 DIAGNOSIS — I700 Atherosclerosis of aorta: Secondary | ICD-10-CM | POA: Insufficient documentation

## 2018-01-23 ENCOUNTER — Other Ambulatory Visit: Payer: Self-pay | Admitting: Specialist

## 2018-01-23 DIAGNOSIS — R918 Other nonspecific abnormal finding of lung field: Secondary | ICD-10-CM

## 2018-04-07 IMAGING — CT CT CHEST W/O CM
2 of 4 series · 15 of 36 positions shown, 18 images · non-contrast
Comparison: 05/02/2015

CLINICAL DATA: Emphysema and  shortness of breath.

EXAM:
CT CHEST WITHOUT CONTRAST
TECHNIQUE: Multidetector CT imaging of the chest was performed following the
standard protocol without IV contrast.

[Series 2: chest · axial · 0.62mm/px · z∈[-1241,-973]mm · 12 of 160 slices shown, 15 images (1 of 2)]
[im 13/160  mediastinal]
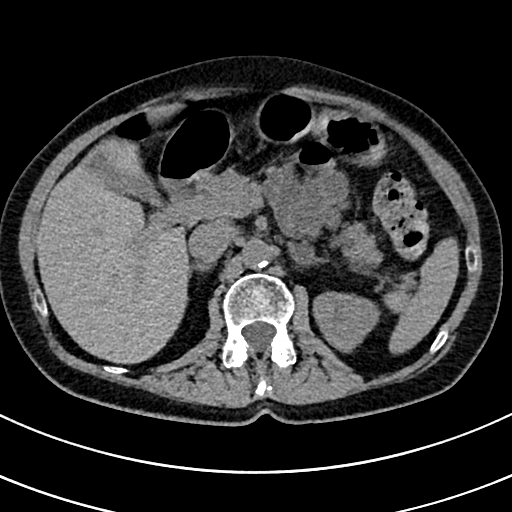
[im 13/160  lung]
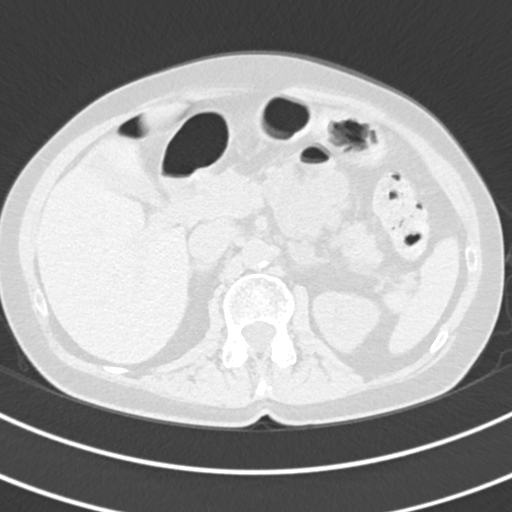
[im 25/160  lung]
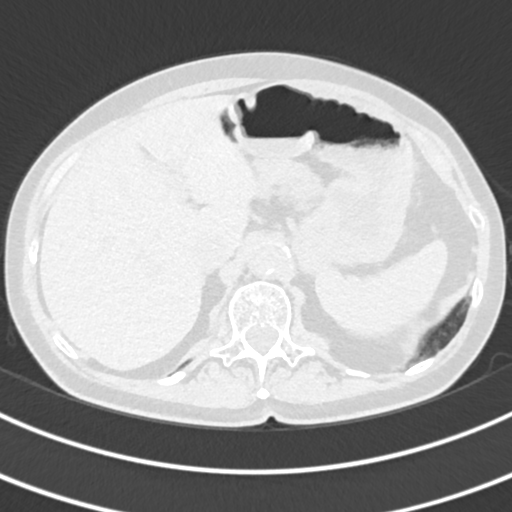
[im 37/160  lung]
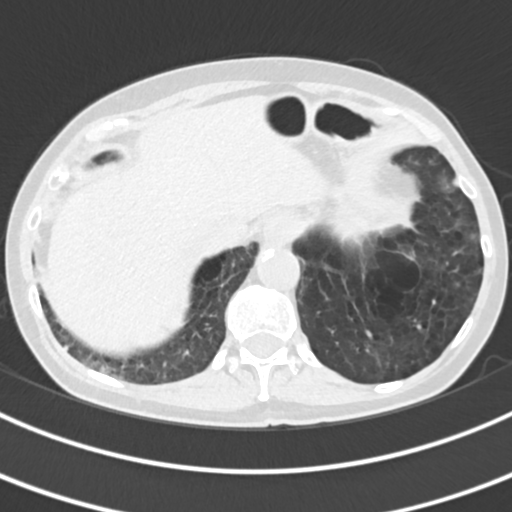
[im 49/160  lung]
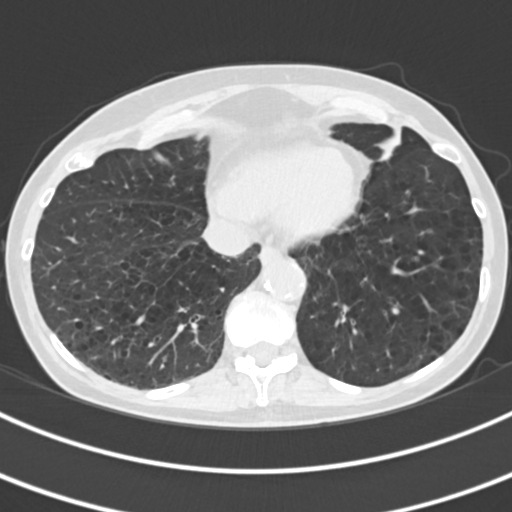
[im 62/160  mediastinal]
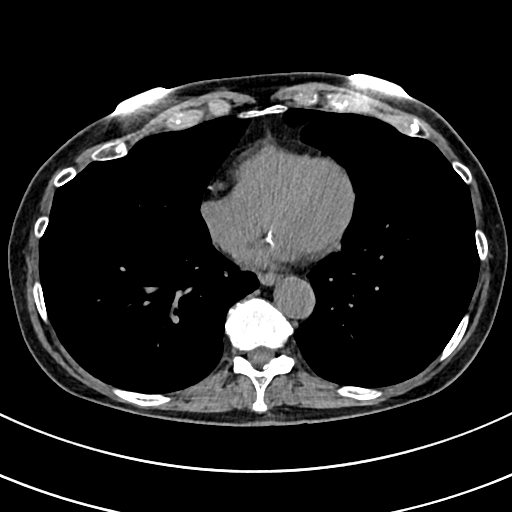
[im 62/160  lung]
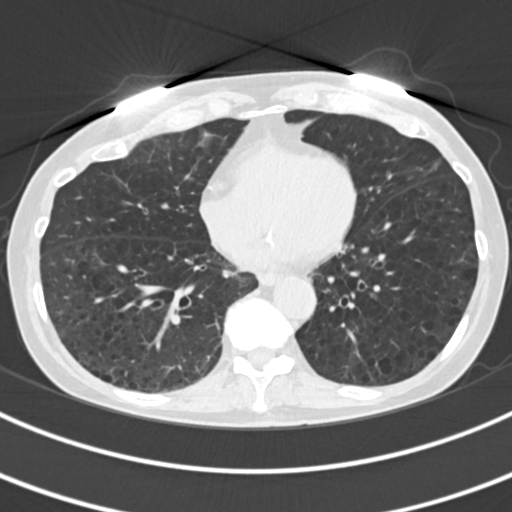
[im 74/160  lung]
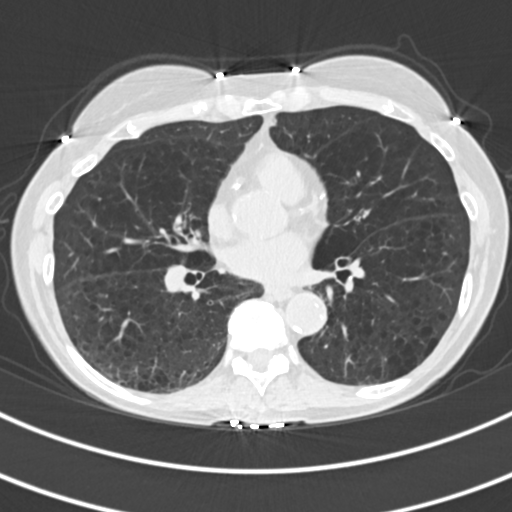
[im 86/160  lung]
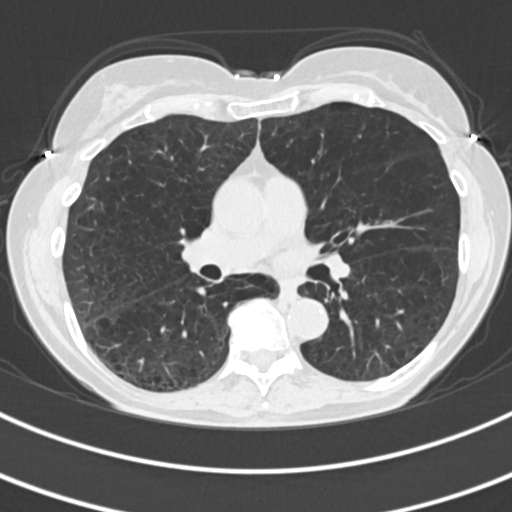
[im 98/160  lung]
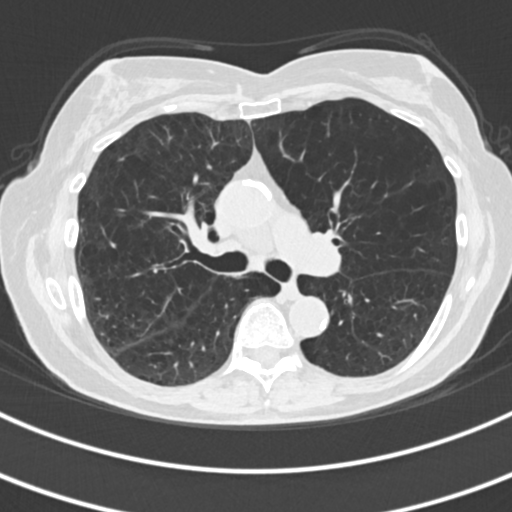
[im 111/160  mediastinal]
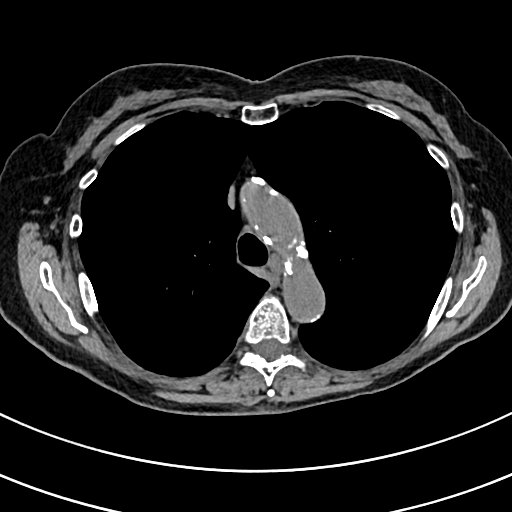
[im 111/160  lung]
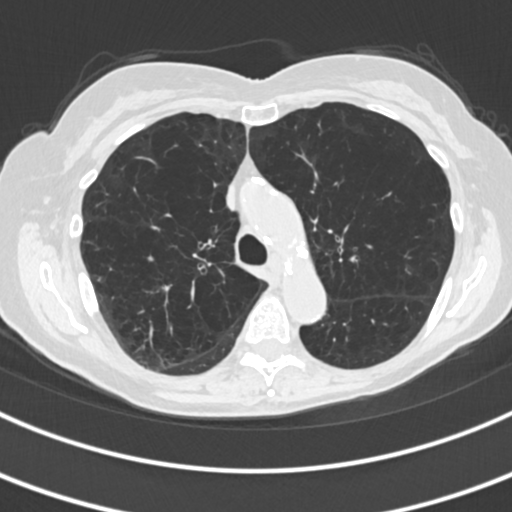
[im 123/160  lung]
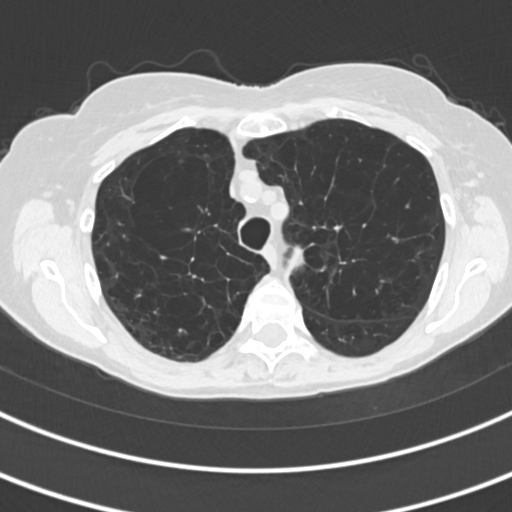
[im 135/160  lung]
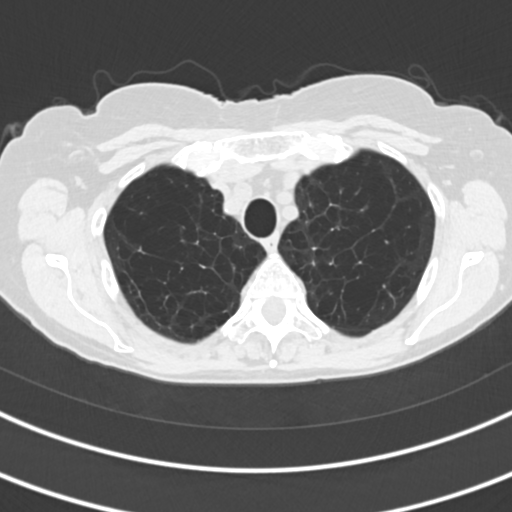
[im 147/160  lung]
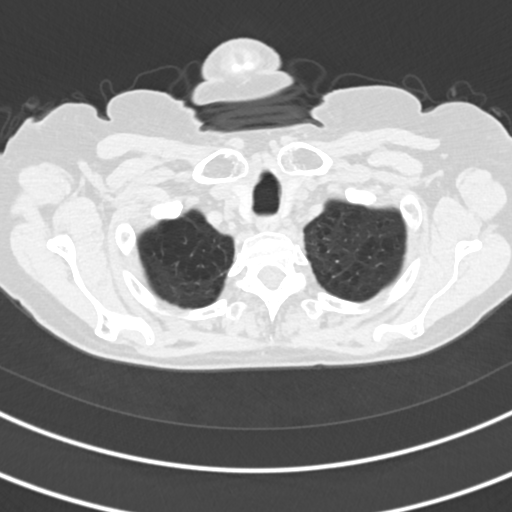

[Series 5: chest · coronal · 0.62mm/px · 3 of 115 slices shown (2 of 2)]
[im 23/115  lung]
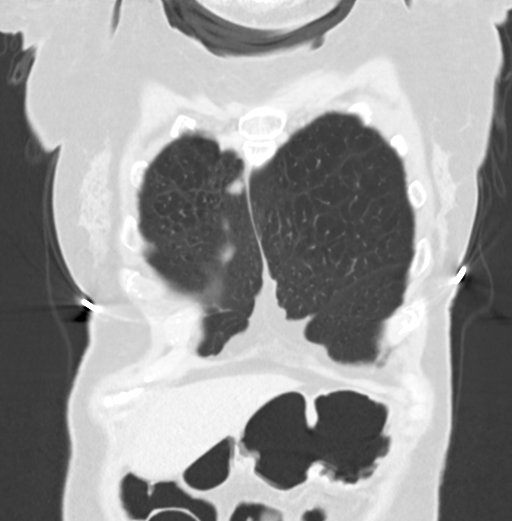
[im 46/115  lung]
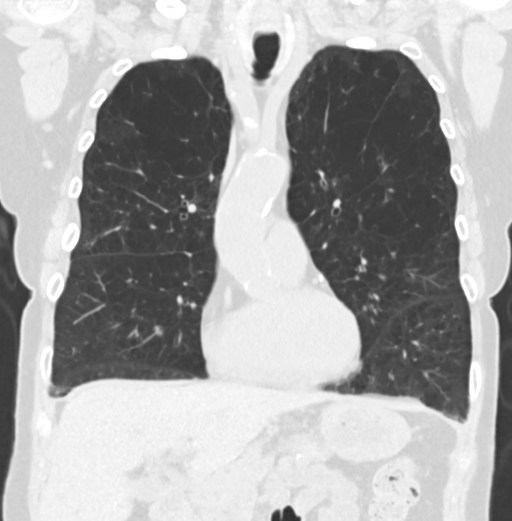
[im 69/115  lung]
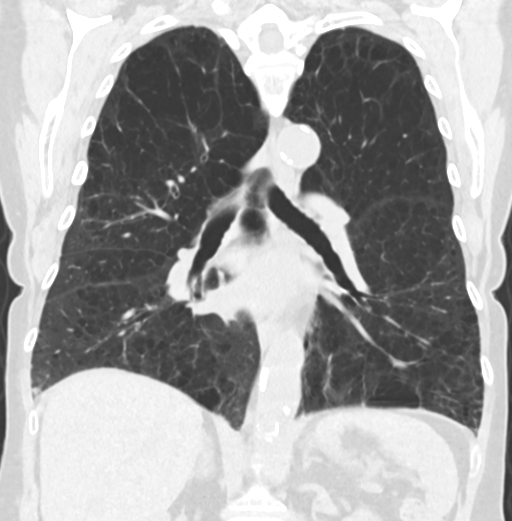

[15 of 36 positions shown; findings below may reference images not displayed]

FINDINGS: Cardiovascular: Coronary, aortic arch, and branch vessel
atherosclerotic vascular disease. Heart size normal.

Mediastinum/Nodes: No adenopathy. Small amount of fluid in the
superior pericardial recess.

Lungs/Pleura:

Markedly severe centrilobular emphysema. Nodularity along the right
lower lobe laterally includes a 0.6 by 0.6 cm nodular density and a
0.9 by 0.3 cm nodular density (images 121 and 122 of series 3,
respectively. These are most likely due to atelectasis but strictly
speaking a true pulmonary nodule is not excluded.

Lingular scarring. There is some mild scarring in the left lower
lobe laterally.

Upper Abdomen: Calcification in the dome of the right hepatic lobe
appears stable. Left adrenal adenoma 1.3 by 1.5 cm, internal density
8 Hounsfield units precontrast.

Musculoskeletal: Unremarkable
IMPRESSION: 1.  Emphysema ([id number].8).  The amount of emphysema is severe.
2. Although the nodularity laterally at the right lung base is
probably from atelectasis, if strictly speaking a neoplastic
pulmonary nodule cannot be excluded. Both of the potential nodules
measure 6 mm in average diameter. Non-contrast chest CT at 3-6
months is recommended. If the nodules are stable at time of repeat
CT, then future CT at 18-24 months (from today's scan) is considered
optional for low-risk patients, but is recommended for high-risk
patients. This recommendation follows the consensus statement:
Guidelines for Management of Incidental Pulmonary Nodules Detected
[DATE].
3. Small left adrenal adenoma.
4.  Aortic Atherosclerosis (QOK0M-G06.6).  Coronary atherosclerosis.

## 2018-04-09 IMAGING — US US CAROTID DUPLEX BILAT
1 series · 13 of 24 positions shown · non-contrast
Comparison: None.

CLINICAL DATA: Syncope, hypertension, prior smoker

EXAM:
BILATERAL CAROTID DUPLEX ULTRASOUND
TECHNIQUE: Gray scale imaging, color Doppler and duplex ultrasound were
performed of bilateral carotid and vertebral arteries in the neck.

[Series 1: us carotid duplex bilat · 13 of 64 slices shown]
[im 1/64]
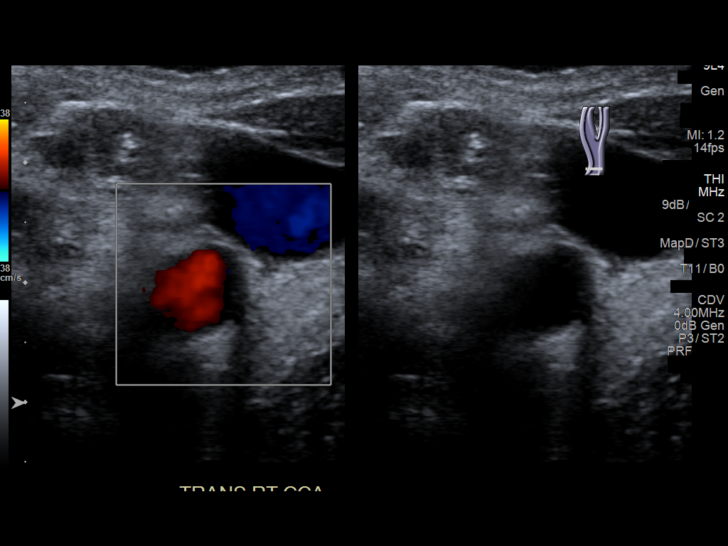
[im 6/64]
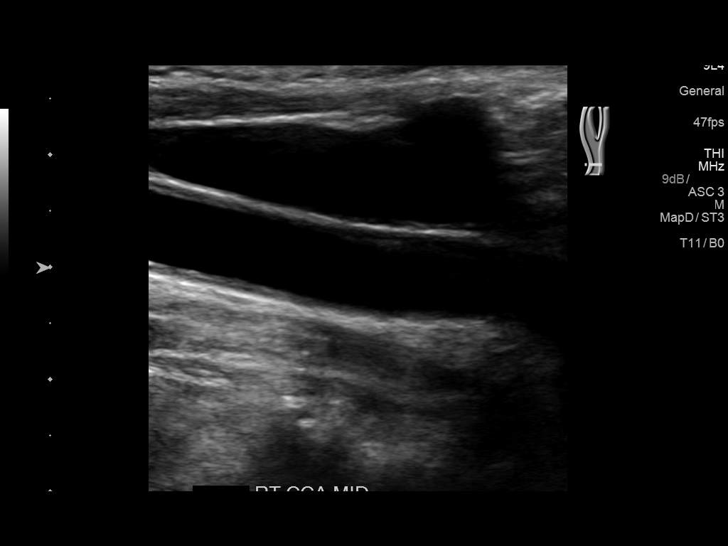
[im 11/64]
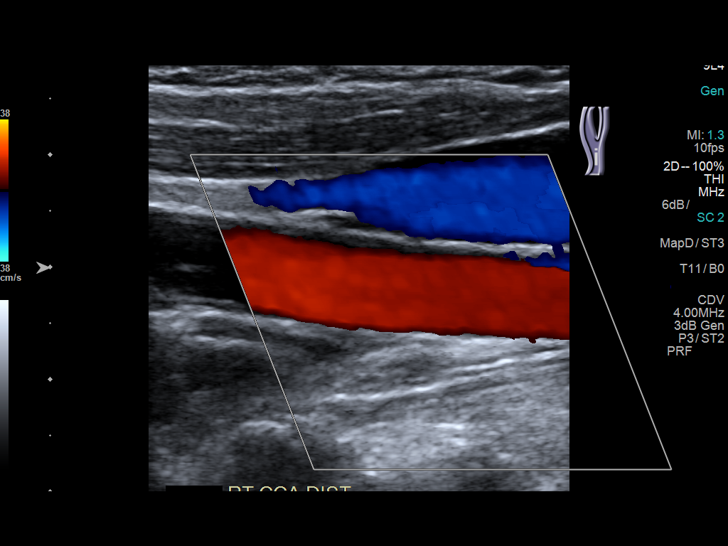
[im 17/64]
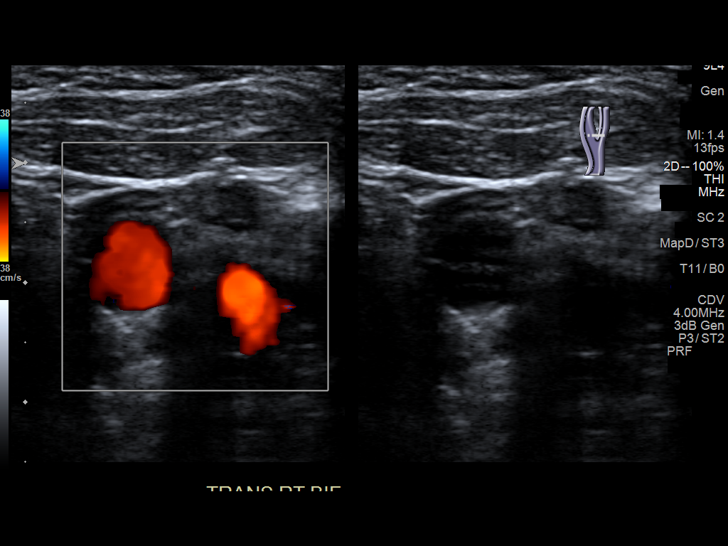
[im 22/64]
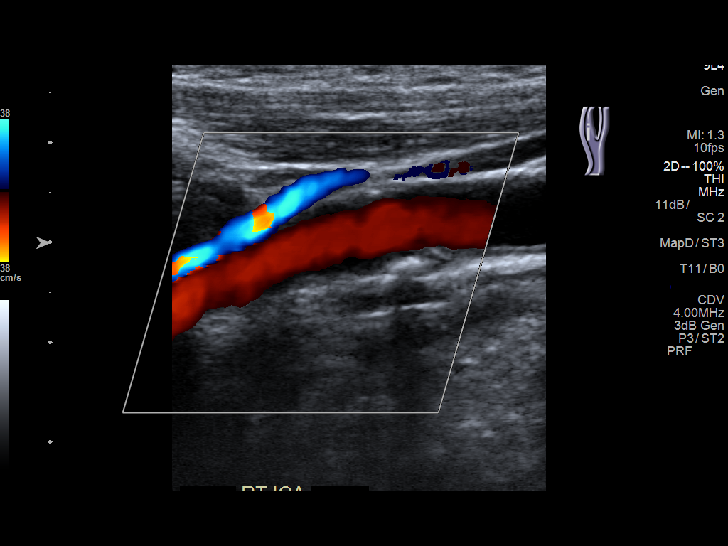
[im 28/64]
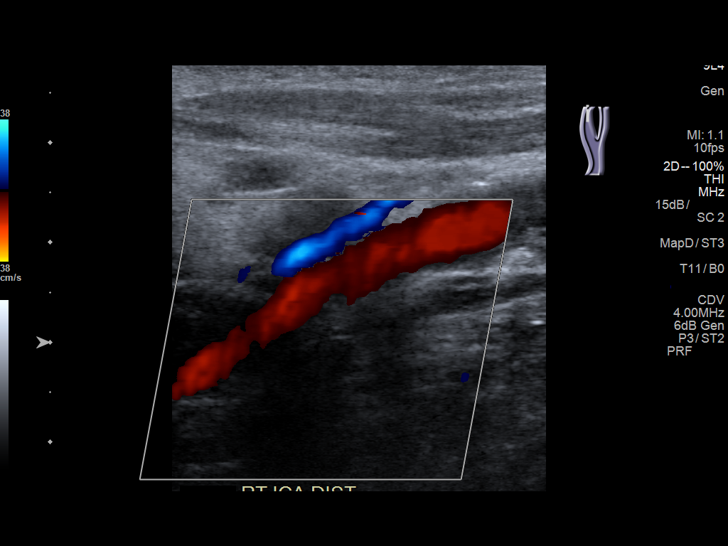
[im 33/64]
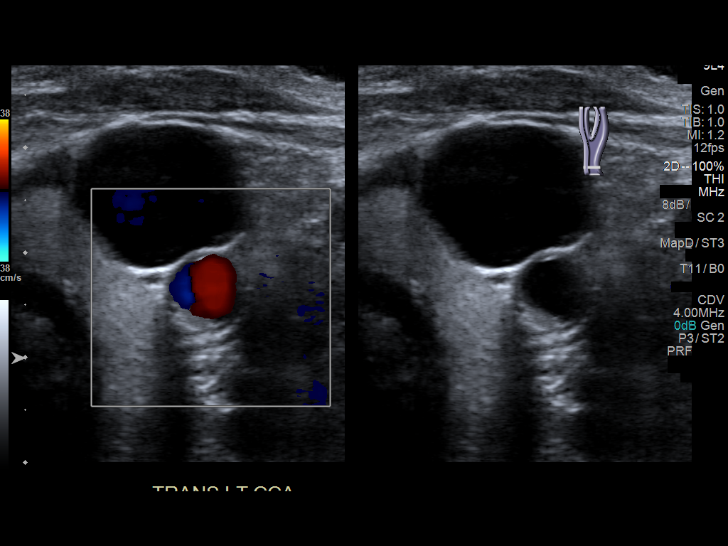
[im 36/64]
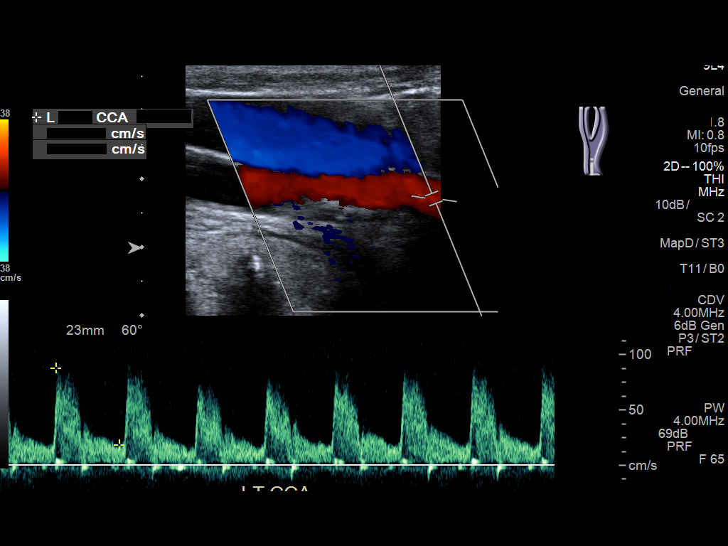
[im 42/64]
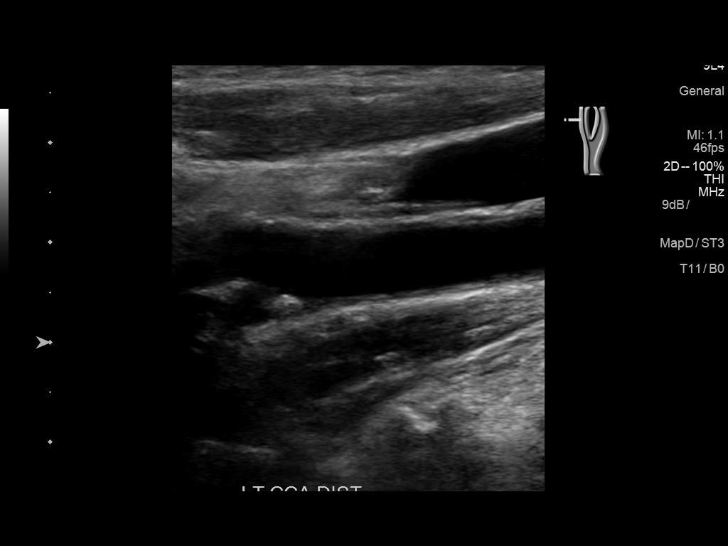
[im 47/64]
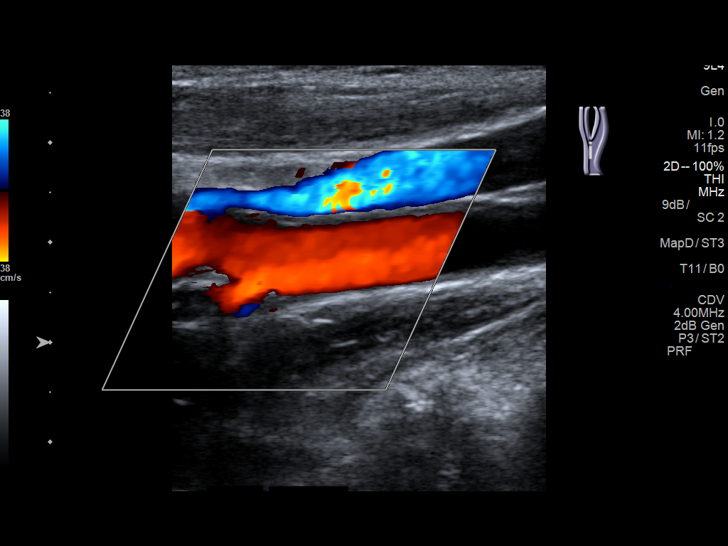
[im 53/64]
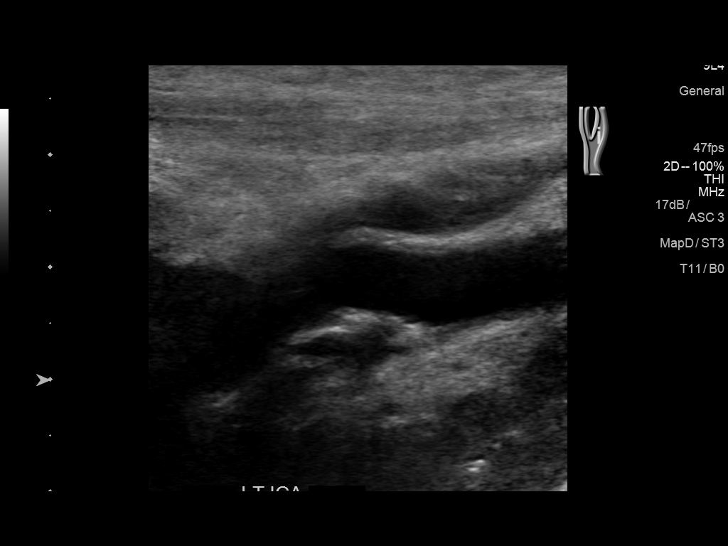
[im 58/64]
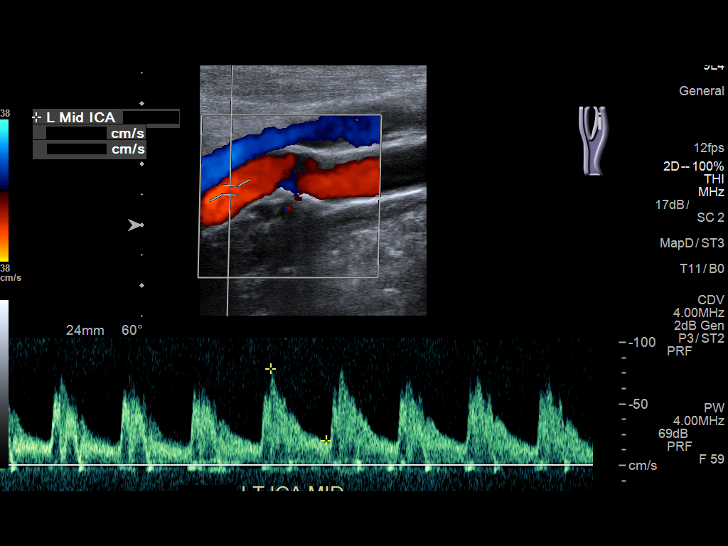
[im 64/64]
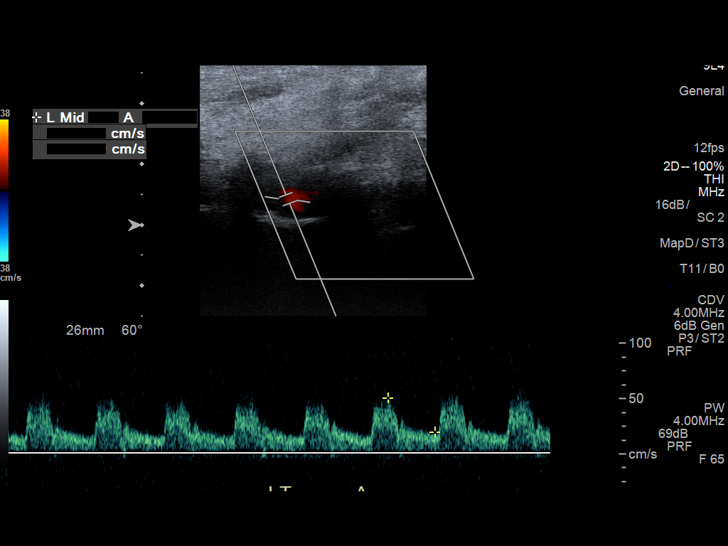

[13 of 24 positions shown; findings below may reference images not displayed]

FINDINGS: Criteria: Quantification of carotid stenosis is based on velocity
parameters that correlate the residual internal carotid diameter
with NASCET-based stenosis levels, using the diameter of the distal
internal carotid lumen as the denominator for stenosis measurement.

The following velocity measurements were obtained:

RIGHT

ICA:  91/27 cm/sec

CCA:  84/19 cm/sec

SYSTOLIC ICA/CCA RATIO:

DIASTOLIC ICA/CCA RATIO:

ECA:  70 cm/sec

LEFT

ICA:  79/20 cm/sec

CCA:  80/23 cm/sec

SYSTOLIC ICA/CCA RATIO:

DIASTOLIC ICA/CCA RATIO:

ECA:  99 cm/sec

RIGHT CAROTID ARTERY: Minor echogenic shadowing plaque formation. No
hemodynamically significant right ICA stenosis, velocity elevation,
or turbulent flow. Degree of narrowing less than 50%.

RIGHT VERTEBRAL ARTERY:  Antegrade

LEFT CAROTID ARTERY: Similar scattered minor echogenic plaque
formation. No hemodynamically significant left ICA stenosis,
velocity elevation, or turbulent flow.

LEFT VERTEBRAL ARTERY:  Antegrade
IMPRESSION: Minor carotid atherosclerosis. No hemodynamically significant ICA
stenosis. Degree of narrowing less than 50% bilaterally.

Patent antegrade vertebral flow bilaterally.

## 2018-04-14 ENCOUNTER — Ambulatory Visit: Payer: Medicare Other

## 2018-04-16 ENCOUNTER — Ambulatory Visit
Admission: RE | Admit: 2018-04-16 | Discharge: 2018-04-16 | Disposition: A | Discharge status: Home / Self Care | Payer: Medicare Other | Source: Ambulatory Visit | Attending: Specialist | Admitting: Specialist

## 2018-04-16 DIAGNOSIS — R918 Other nonspecific abnormal finding of lung field: Secondary | ICD-10-CM | POA: Insufficient documentation

## 2018-04-16 DIAGNOSIS — I700 Atherosclerosis of aorta: Secondary | ICD-10-CM | POA: Insufficient documentation

## 2018-04-16 DIAGNOSIS — J449 Chronic obstructive pulmonary disease, unspecified: Secondary | ICD-10-CM | POA: Insufficient documentation

## 2018-04-16 DIAGNOSIS — D3502 Benign neoplasm of left adrenal gland: Secondary | ICD-10-CM | POA: Insufficient documentation

## 2018-04-16 DIAGNOSIS — K449 Diaphragmatic hernia without obstruction or gangrene: Secondary | ICD-10-CM | POA: Diagnosis not present

## 2018-04-16 DIAGNOSIS — I2510 Atherosclerotic heart disease of native coronary artery without angina pectoris: Secondary | ICD-10-CM | POA: Diagnosis not present

## 2018-12-10 ENCOUNTER — Encounter: Payer: Self-pay | Admitting: *Deleted

## 2018-12-11 ENCOUNTER — Other Ambulatory Visit: Payer: Self-pay

## 2018-12-11 ENCOUNTER — Ambulatory Visit: Payer: Medicare Other | Admitting: Anesthesiology

## 2018-12-11 ENCOUNTER — Ambulatory Visit
Admission: RE | Admit: 2018-12-11 | Discharge: 2018-12-11 | Disposition: A | Discharge status: Home / Self Care | Payer: Medicare Other | Attending: Gastroenterology | Admitting: Gastroenterology

## 2018-12-11 ENCOUNTER — Encounter
Admission: RE | Disposition: A | Discharge status: Home / Self Care | Payer: Self-pay | Source: Home / Self Care | Attending: Gastroenterology

## 2018-12-11 DIAGNOSIS — Z1211 Encounter for screening for malignant neoplasm of colon: Secondary | ICD-10-CM | POA: Diagnosis not present

## 2018-12-11 DIAGNOSIS — D120 Benign neoplasm of cecum: Secondary | ICD-10-CM | POA: Insufficient documentation

## 2018-12-11 DIAGNOSIS — Z7951 Long term (current) use of inhaled steroids: Secondary | ICD-10-CM | POA: Diagnosis not present

## 2018-12-11 DIAGNOSIS — Z85828 Personal history of other malignant neoplasm of skin: Secondary | ICD-10-CM | POA: Insufficient documentation

## 2018-12-11 DIAGNOSIS — Z952 Presence of prosthetic heart valve: Secondary | ICD-10-CM | POA: Insufficient documentation

## 2018-12-11 DIAGNOSIS — Z8371 Family history of colonic polyps: Secondary | ICD-10-CM | POA: Diagnosis not present

## 2018-12-11 DIAGNOSIS — E559 Vitamin D deficiency, unspecified: Secondary | ICD-10-CM | POA: Insufficient documentation

## 2018-12-11 DIAGNOSIS — K228 Other specified diseases of esophagus: Secondary | ICD-10-CM | POA: Diagnosis not present

## 2018-12-11 DIAGNOSIS — R1012 Left upper quadrant pain: Secondary | ICD-10-CM | POA: Insufficient documentation

## 2018-12-11 DIAGNOSIS — E7800 Pure hypercholesterolemia, unspecified: Secondary | ICD-10-CM | POA: Insufficient documentation

## 2018-12-11 DIAGNOSIS — I129 Hypertensive chronic kidney disease with stage 1 through stage 4 chronic kidney disease, or unspecified chronic kidney disease: Secondary | ICD-10-CM | POA: Insufficient documentation

## 2018-12-11 DIAGNOSIS — Z7982 Long term (current) use of aspirin: Secondary | ICD-10-CM | POA: Diagnosis not present

## 2018-12-11 DIAGNOSIS — K5520 Angiodysplasia of colon without hemorrhage: Secondary | ICD-10-CM | POA: Diagnosis not present

## 2018-12-11 DIAGNOSIS — Q438 Other specified congenital malformations of intestine: Secondary | ICD-10-CM | POA: Diagnosis not present

## 2018-12-11 DIAGNOSIS — K2960 Other gastritis without bleeding: Secondary | ICD-10-CM | POA: Insufficient documentation

## 2018-12-11 DIAGNOSIS — K210 Gastro-esophageal reflux disease with esophagitis: Secondary | ICD-10-CM | POA: Insufficient documentation

## 2018-12-11 DIAGNOSIS — Q439 Congenital malformation of intestine, unspecified: Secondary | ICD-10-CM | POA: Diagnosis not present

## 2018-12-11 DIAGNOSIS — N183 Chronic kidney disease, stage 3 (moderate): Secondary | ICD-10-CM | POA: Insufficient documentation

## 2018-12-11 DIAGNOSIS — K449 Diaphragmatic hernia without obstruction or gangrene: Secondary | ICD-10-CM | POA: Insufficient documentation

## 2018-12-11 DIAGNOSIS — Z79899 Other long term (current) drug therapy: Secondary | ICD-10-CM | POA: Insufficient documentation

## 2018-12-11 DIAGNOSIS — J449 Chronic obstructive pulmonary disease, unspecified: Secondary | ICD-10-CM | POA: Diagnosis not present

## 2018-12-11 DIAGNOSIS — K5730 Diverticulosis of large intestine without perforation or abscess without bleeding: Secondary | ICD-10-CM | POA: Insufficient documentation

## 2018-12-11 DIAGNOSIS — H409 Unspecified glaucoma: Secondary | ICD-10-CM | POA: Insufficient documentation

## 2018-12-11 DIAGNOSIS — K3189 Other diseases of stomach and duodenum: Secondary | ICD-10-CM | POA: Insufficient documentation

## 2018-12-11 DIAGNOSIS — D123 Benign neoplasm of transverse colon: Secondary | ICD-10-CM | POA: Insufficient documentation

## 2018-12-11 DIAGNOSIS — Z87891 Personal history of nicotine dependence: Secondary | ICD-10-CM | POA: Insufficient documentation

## 2018-12-11 DIAGNOSIS — K219 Gastro-esophageal reflux disease without esophagitis: Secondary | ICD-10-CM | POA: Insufficient documentation

## 2018-12-11 HISTORY — DX: Chronic kidney disease, unspecified: N18.9

## 2018-12-11 HISTORY — DX: Low back pain, unspecified: M54.50

## 2018-12-11 HISTORY — PX: ESOPHAGOGASTRODUODENOSCOPY (EGD) WITH PROPOFOL: SHX5813

## 2018-12-11 HISTORY — PX: COLONOSCOPY WITH PROPOFOL: SHX5780

## 2018-12-11 HISTORY — DX: Other lack of coordination: R27.8

## 2018-12-11 HISTORY — DX: Atherosclerosis of aorta: I70.0

## 2018-12-11 HISTORY — DX: Low back pain: M54.5

## 2018-12-11 SURGERY — COLONOSCOPY WITH PROPOFOL
Anesthesia: General

## 2018-12-11 MED ORDER — LIDOCAINE HCL (PF) 2 % IJ SOLN
INTRAMUSCULAR | Status: DC | PRN
  Administered 2018-12-11: 100 mg via INTRADERMAL

## 2018-12-11 MED ORDER — SODIUM CHLORIDE 0.9 % IV SOLN
INTRAVENOUS | Status: DC
  Administered 2018-12-11: 11:00:00 via INTRAVENOUS

## 2018-12-11 MED ORDER — PROPOFOL 10 MG/ML IV BOLUS
INTRAVENOUS | Status: DC | PRN
  Administered 2018-12-11: 50 mg via INTRAVENOUS
  Administered 2018-12-11: 10 mg via INTRAVENOUS

## 2018-12-11 MED ORDER — PROPOFOL 500 MG/50ML IV EMUL
INTRAVENOUS | Status: DC | PRN
  Administered 2018-12-11: 125 ug/kg/min via INTRAVENOUS

## 2018-12-11 MED ORDER — PHENYLEPHRINE HCL 10 MG/ML IJ SOLN
INTRAMUSCULAR | Status: DC | PRN
  Administered 2018-12-11 (×2): 100 ug via INTRAVENOUS

## 2018-12-11 NOTE — H&P (Signed)
Outpatient short stay form Pre-procedure 12/11/2018 10:49 AM Jessica Sails MD  Primary Physician: Dr. Genene Churn  Reason for visit: EGD and colonoscopy  History of present illness: Patient is a 78 year old female presenting today as above.  She has a family history of colon polyps in her mother.  She is also been experiencing some epigastric and left upper quadrant discomfort.  Does have a history of marked COPD.  Tolerated her prep well.  He does take 81 mg aspirin that she has held.  She takes no other aspirin products or blood thinning agent.  Has had a history in the past for iron deficiency anemia.  Patient does have a history of irritable bowel syndrome.    Current Facility-Administered Medications:  .  0.9 %  sodium chloride infusion, , Intravenous, Continuous, Jessica Sails, MD  Medications Prior to Admission  Medication Sig Dispense Refill Last Dose  . albuterol (PROAIR HFA) 108 (90 Base) MCG/ACT inhaler Inhale 2 puffs into the lungs See admin instructions. Inhale 2 puffs every 4 to 6 hours as needed for shortness of breath/wheezing.   12/10/2018 at Unknown time  . aspirin EC 81 MG tablet Take 81 mg by mouth at bedtime.    Past Week at Unknown time  . betamethasone dipropionate (DIPROLENE) 0.05 % cream Apply topically 2 (two) times daily.   Past Week at Unknown time  . butalbital-acetaminophen-caffeine (FIORICET, ESGIC) 50-325-40 MG tablet Take 1 tablet by mouth every 4 (four) hours as needed for headache.   12/10/2018 at Unknown time  . Cholecalciferol (VITAMIN D3) 1000 units CAPS Take 1,000 Units by mouth daily.    Past Week at Unknown time  . cyclobenzaprine (FLEXERIL) 5 MG tablet Take 5 mg by mouth daily as needed for muscle spasms.    Past Week at Unknown time  . docusate sodium (COLACE) 100 MG capsule Take 100 mg by mouth daily.   Past Month at Unknown time  . escitalopram (LEXAPRO) 10 MG tablet Take 5-10 mg by mouth See admin instructions. Take 1/2 tablet  (5 mg) by  mouth every morning, and take 1 tablet by mouth every night at bedtime.   12/10/2018 at Unknown time  . fluticasone furoate-vilanterol (BREO ELLIPTA) 100-25 MCG/INH AEPB Inhale 1 puff into the lungs daily.    12/10/2018 at Unknown time  . hydrOXYzine (ATARAX/VISTARIL) 10 MG tablet Take 10 mg by mouth at bedtime as needed for itching.   Past Week at Unknown time  . ipratropium-albuterol (DUONEB) 0.5-2.5 (3) MG/3ML SOLN Take 3 mLs by nebulization 4 (four) times daily. For shortness of breath/wheezing.   12/11/2018 at Unknown time  . irbesartan-hydrochlorothiazide (AVALIDE) 150-12.5 MG tablet Take 0.5 tablets by mouth daily.    12/11/2018 at 0500  . ketoconazole (NIZORAL) 2 % cream Apply 1 application topically daily.   Past Week at Unknown time  . loratadine (CLARITIN) 10 MG tablet Take 10 mg by mouth daily as needed for allergies.   Past Week at Unknown time  . LUMIGAN 0.01 % SOLN Place 1 drop into both eyes at bedtime.   12/10/2018 at Unknown time  . Multiple Vitamin (MULTIVITAMIN) tablet Take 1 tablet by mouth 2 (two) times a week.   Past Week at Unknown time  . Multiple Vitamins-Minerals (STRESSTABS ADVANCED) TABS Take 1 tablet by mouth daily.   Past Week at Unknown time  . Probiotic Product (PHILLIPS COLON HEALTH PO) Take 1 capsule by mouth daily.    Past Month at Unknown time  . cefUROXime (  CEFTIN) 500 MG tablet Take 500 mg by mouth 2 (two) times daily with a meal.   Not Taking at Unknown time  . Melatonin 5 MG TABS Take 5 mg by mouth at bedtime as needed. For sleep.   Not Taking at Unknown time  . omeprazole (PRILOSEC) 20 MG capsule Take 20 mg by mouth daily.   Not Taking at Unknown time  . ondansetron (ZOFRAN-ODT) 4 MG disintegrating tablet Take 4 mg by mouth once.   Not Taking at Unknown time  . predniSONE (STERAPRED UNI-PAK 21 TAB) 10 MG (21) TBPK tablet Take 40 mg by mouth daily.   Not Taking at Unknown time  . triamcinolone cream (KENALOG) 0.1 % Apply 1 application topically 2 (two) times daily.    Not Taking at Unknown time     Allergies  Allergen Reactions  . Alendronate     Other reaction(s): Abdominal Pain GI upset  . Latex     Other reaction(s): Other (See Comments) blisters  . Lisinopril Swelling    Swollen palate Patient states it swelled up her soft palate really bad.   . Penicillin G Rash  . Risedronate     Other reaction(s): Abdominal Pain GI upset  . Epinephrine     Other reaction(s): Unknown  . Penicillins Itching    Palms of hands of soles of feet become splotchy and itch. Has patient had a PCN reaction causing immediate rash, facial/tongue/throat swelling, SOB or lightheadedness with hypotension: Yes Has patient had a PCN reaction causing severe rash involving mucus membranes or skin necrosis: No Has patient had a PCN reaction that required hospitalization No Has patient had a PCN reaction occurring within the last 10 years: No If all of the above answers are "NO", then may proceed with Cephalosporin use.     Past Medical History:  Diagnosis Date  . Anxiety   . Aortic atherosclerosis ([place])   . Cancer ([place])    skin ca on nose  . Chronic kidney disease    stage 3  . COPD (chronic obstructive pulmonary disease) ([place])    exacerbation 04/16/2017  . Diverticulosis   . Dyspnea    with exertion  . Fibrocystic breast disease   . GERD (gastroesophageal reflux disease)   . Glaucoma   . Hypercholesterolemia   . Hypertension   . IBS (irritable bowel syndrome)   . Low back pain   . Migraines    migraine with aura  . Osteoporosis   . Psoriasis   . Sensory ataxia   . Skin cancer of nose   . 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 Vitamin D deficiency     Review of systems:      Physical Exam    Heart and lungs: Rhythm without rub or gallop, lungs are bilaterally clear.    HEENT: Normocephalic atraumatic eyes are anicteric    Other:    Pertinant exam for procedure: Soft nontender nondistended bowel sounds positive  normoactive.    Planned proceedures: EGD, colonoscopy and indicated procedures. I have discussed the risks benefits and complications of procedures to include not limited to bleeding, infection, perforation and the risk of sedation and the patient wishes to proceed.    Jessica Sails, MD Gastroenterology 12/11/2018  10:49 AM

## 2018-12-11 NOTE — Anesthesia Postprocedure Evaluation (Signed)
Anesthesia Post Note  Patient: Teena Irani  Procedure(s) Performed: COLONOSCOPY WITH PROPOFOL (N/A ) ESOPHAGOGASTRODUODENOSCOPY (EGD) WITH PROPOFOL (N/A )  Patient location during evaluation: Endoscopy Anesthesia Type: General Level of consciousness: awake and alert and oriented Pain management: pain level controlled Vital Signs Assessment: post-procedure vital signs reviewed and stable Respiratory status: spontaneous breathing, nonlabored ventilation and respiratory function stable Cardiovascular status: blood pressure returned to baseline and stable Postop Assessment: no signs of nausea or vomiting Anesthetic complications: no     Last Vitals:  Vitals:   12/11/18 1223 12/11/18 1233  BP:  124/80  Pulse: 73 73  Resp: 17 13  Temp:    SpO2: 100% 99%    Last Pain:  Vitals:   12/11/18 1233  TempSrc:   PainSc: 2                  Lyrica Mcclarty

## 2018-12-11 NOTE — Anesthesia Preprocedure Evaluation (Signed)
Anesthesia Evaluation  Patient identified by MRN, date of birth, ID band Patient awake    Reviewed: Allergy & Precautions, NPO status , Patient's Chart, lab work & pertinent test results  History of Anesthesia Complications Negative for: history of anesthetic complications  Airway Mallampati: II  TM Distance: >3 FB Neck ROM: Full    Dental no notable dental hx.    Pulmonary neg sleep apnea, COPD,  COPD inhaler, former smoker, neg PE   breath sounds clear to auscultation- rhonchi (-) wheezing      Cardiovascular hypertension, Pt. on medications (-) CAD, (-) Past MI, (-) Cardiac Stents and (-) CABG  Rhythm:Regular Rate:Normal - Systolic murmurs and - Diastolic murmurs    Neuro/Psych  Headaches, Anxiety    GI/Hepatic Neg liver ROS, GERD  ,  Endo/Other  negative endocrine ROSneg diabetes  Renal/GU Renal InsufficiencyRenal disease     Musculoskeletal negative musculoskeletal ROS (+)   Abdominal (+) - obese,   Peds  Hematology negative hematology ROS (+)   Anesthesia Other Findings Past Medical History: No date: Anxiety No date: Aortic atherosclerosis (HCC) No date: Cancer ([place])     Comment:  skin ca on nose No date: Chronic kidney disease     Comment:  stage 3 No date: COPD (chronic obstructive pulmonary disease) (HCC)     Comment:  exacerbation 04/16/2017 No date: Diverticulosis No date: Dyspnea     Comment:  with exertion No date: Fibrocystic breast disease No date: GERD (gastroesophageal reflux disease) No date: Glaucoma No date: Hypercholesterolemia No date: Hypertension No date: IBS (irritable bowel syndrome) No date: Low back pain No date: Migraines     Comment:  migraine with aura No date: Osteoporosis No date: Psoriasis No date: Sensory ataxia No date: Skin cancer of nose No date: Tremors of nervous system     Comment:  in hands No date: Vitamin D deficiency   Reproductive/Obstetrics                              Anesthesia Physical Anesthesia Plan  ASA: III  Anesthesia Plan: General   Post-op Pain Management:    Induction: Intravenous  PONV Risk Score and Plan: 2 and Propofol infusion  Airway Management Planned: Natural Airway  Additional Equipment:   Intra-op Plan:   Post-operative Plan:   Informed Consent: I have reviewed the patients History and Physical, chart, labs and discussed the procedure including the risks, benefits and alternatives for the proposed anesthesia with the patient or authorized representative who has indicated his/her understanding and acceptance.     Dental advisory given  Plan Discussed with: CRNA and Anesthesiologist  Anesthesia Plan Comments:         Anesthesia Quick Evaluation

## 2018-12-11 NOTE — Op Note (Signed)
[facility] Gastroenterology Patient Name: Jessica Bowman Procedure Date: 12/11/2018 10:39 AM MRN: [id number] Account #: [id number] Date of Birth: [birth year] Admit Type: Outpatient Age: 78 Room: [facility] ENDO ROOM 1 Gender: Female Note Status: Finalized Procedure:            Upper GI endoscopy Indications:          Epigastric abdominal pain, Abdominal pain in the left                        upper quadrant Providers:            Lollie Sails, MD Medicines:            Monitored Anesthesia Care Complications:        No immediate complications. Procedure:            Pre-Anesthesia Assessment:                       - ASA Grade Assessment: III - A patient with severe                        systemic disease.                       After obtaining informed consent, the endoscope was                        passed under direct vision. Throughout the procedure,                        the patient's blood pressure, pulse, and oxygen                        saturations were monitored continuously. The Endoscope                        was introduced through the mouth, and advanced to the                        third part of duodenum. The upper GI endoscopy was                        accomplished without difficulty. The patient tolerated                        the procedure well. Findings:      The Z-line was irregular and was found 36 cm from the incisors. Biopsies       were taken with a cold forceps for histology.      A single 4 mm mucosal nodule with a localized distribution was found in       the distal esophagus, 37 cm from the incisors. Biopsies were taken with       a cold forceps for histology.      A small hiatal hernia was found. The Z-line was a variable distance from       incisors; the hiatal hernia was sliding.      Diffuse and patchy moderate inflammation characterized by adherent       blood, congestion (edema), erosions, erythema and shallow ulcerations   was found in the gastric body and in the gastric antrum. Biopsies were  taken with a cold forceps for histology. Biopsies were taken with a cold       forceps for Helicobacter pylori testing.      A single 4 mm mucosal papule (nodule)associated with an erosion and       stigmata/heme flag was found on the lesser curvature of the gastric       body. Biopsies were taken with a cold forceps for histology.      The examined duodenum was normal. Biopsies were taken with a cold       forceps for histology.      The exam of the esophagus was otherwise normal. Impression:           - Z-line irregular, 36 cm from the incisors. Biopsied.                       - Mucosal nodule found in the esophagus. Biopsied.                       - Small hiatal hernia.                       - Erosive gastritis. Biopsied.                       - A single mucosal papule (nodule) found in the                        stomach. Biopsied.                       - Normal examined duodenum. Biopsied. Recommendation:       - Use Protonix (pantoprazole) 40 mg PO BID for 2 weeks.                       - Use Protonix (pantoprazole) 40 mg PO daily daily. Procedure Code(s):    --- Professional ---                       [phone], Esophagogastroduodenoscopy, flexible, transoral;                        with biopsy, single or multiple Diagnosis Code(s):    --- Professional ---                       K22.8, Other specified diseases of esophagus                       K44.9, Diaphragmatic hernia without obstruction or                        gangrene                       K29.60, Other gastritis without bleeding                       K31.89, Other diseases of stomach and duodenum                       R10.13, Epigastric pain                       R10.12, Left  upper quadrant pain CPT copyright 2018 American Medical Association. All rights reserved. The codes documented in this report are preliminary and upon coder review may  be  revised to meet current compliance requirements. Lollie Sails, MD 12/11/2018 11:23:47 AM This report has been signed electronically. Number of Addenda: 0 Note Initiated On: 12/11/2018 10:39 AM      [facility]

## 2018-12-11 NOTE — Anesthesia Post-op Follow-up Note (Signed)
Anesthesia QCDR form completed.        

## 2018-12-11 NOTE — Transfer of Care (Signed)
Immediate Anesthesia Transfer of Care Note  Patient: Jessica Bowman  Procedure(s) Performed: COLONOSCOPY WITH PROPOFOL (N/A ) ESOPHAGOGASTRODUODENOSCOPY (EGD) WITH PROPOFOL (N/A )  Patient Location: PACU  Anesthesia Type:General  Level of Consciousness: sedated  Airway & Oxygen Therapy: Patient Spontanous Breathing and Patient connected to nasal cannula oxygen  Post-op Assessment: Report given to RN and Post -op Vital signs reviewed and stable  Post vital signs: Reviewed and stable  Last Vitals:  Vitals Value Taken Time  BP    Temp    Pulse    Resp    SpO2      Last Pain:  Vitals:   12/11/18 1003  TempSrc: Oral  PainSc: 0-No pain         Complications: No apparent anesthesia complications

## 2018-12-11 NOTE — Op Note (Addendum)
[facility] Patient Name: Jessica Bowman Procedure Date: 12/11/2018 10:39 AM MRN: [id number] Account #: [id number] Date of Birth: 10/02/1941 Admit Type: Outpatient Age: 78 Room: [facility] ENDO ROOM 1 Gender: Female Note Status: Finalized Procedure:            Colonoscopy Indications:          Family history of colonic polyps in a first-degree                        relative Providers:            Lollie Sails, MD Medicines:            Monitored Anesthesia Care Complications:        No immediate complications. Procedure:            Pre-Anesthesia Assessment:                       - ASA Grade Assessment: III - A patient with severe                        systemic disease.                       After obtaining informed consent, the colonoscope was                        passed under direct vision. Throughout the procedure,                        the patient's blood pressure, pulse, and oxygen                        saturations were monitored continuously. The                        Colonoscope was introduced through the anus and                        advanced to the the cecum, identified by appendiceal                        orifice and ileocecal valve. The colonoscopy was                        unusually difficult due to a redundant colon,                        significant looping and a tortuous colon. Successful                        completion of the procedure was aided by changing the                        patient to a supine position, changing the patient to a                        prone position and using manual pressure. The patient                        tolerated the procedure well. The quality of  the bowel                        preparation was good. Findings:      Multiple small and large-mouthed diverticula were found in the sigmoid       colon, descending colon and transverse colon.      A 8 mm polyp was found in the transverse  colon. The polyp was sessile.       The polyp was removed with a cold snare. Resection and retrieval were       complete.      A 7 mm polyp was found in the cecum. The polyp was multi-lobulated and       sessile. The polyp was removed with a piecemeal technique using a cold       biopsy forceps. Resection and retrieval were complete.      Two small localized angioectasias without bleeding were found in the       cecum.      The digital rectal exam was normal. Impression:           - Diverticulosis in the sigmoid colon, in the                        descending colon and in the transverse colon.                       - One 8 mm polyp in the transverse colon, removed with                        a cold snare. Resected and retrieved.                       - One 7 mm polyp in the cecum, removed piecemeal using                        a cold biopsy forceps. Resected and retrieved.                       - Two non-bleeding colonic angioectasias. Recommendation:       - Discharge patient to home.                       - Await pathology results.                       - Telephone GI clinic for pathology results in 1 week. Procedure Code(s):    --- Professional ---                       [phone], Colonoscopy, flexible; with removal of tumor(s),                        polyp(s), or other lesion(s) by snare technique                       [id number], 70, Colonoscopy, flexible; with biopsy, single                        or multiple Diagnosis Code(s):    --- Professional ---  D12.3, Benign neoplasm of transverse colon (hepatic                        flexure or splenic flexure)                       D12.0, Benign neoplasm of cecum                       K55.20, Angiodysplasia of colon without hemorrhage                       Z83.71, Family history of colonic polyps                       K57.30, Diverticulosis of large intestine without                        perforation or abscess without  bleeding CPT copyright 2018 American Medical Association. All rights reserved. The codes documented in this report are preliminary and upon coder review may  be revised to meet current compliance requirements. Lollie Sails, MD 12/11/2018 12:11:45 PM This report has been signed electronically. Number of Addenda: 0 Note Initiated On: 12/11/2018 10:39 AM Scope Withdrawal Time: 0 hours 12 minutes 29 seconds  Total Procedure Duration: 0 hours 38 minutes 7 seconds       [facility]

## 2018-12-11 NOTE — Anesthesia Procedure Notes (Signed)
Date/Time: 12/11/2018 11:00 AM Performed by: Nelda Marseille, CRNA Pre-anesthesia Checklist: Patient identified, Emergency Drugs available, Suction available, Patient being monitored and Timeout performed Oxygen Delivery Method: Nasal cannula

## 2018-12-14 ENCOUNTER — Encounter: Payer: Self-pay | Admitting: Gastroenterology

## 2018-12-14 LAB — SURGICAL PATHOLOGY

## 2019-01-07 ENCOUNTER — Other Ambulatory Visit: Payer: Self-pay

## 2019-01-07 ENCOUNTER — Emergency Department
Admission: EM | Admit: 2019-01-07 | Discharge: 2019-01-07 | Disposition: A | Discharge status: Home / Self Care | Payer: Medicare Other | Attending: Emergency Medicine | Admitting: Emergency Medicine

## 2019-01-07 ENCOUNTER — Emergency Department: Payer: Medicare Other

## 2019-01-07 ENCOUNTER — Encounter: Payer: Self-pay | Admitting: Emergency Medicine

## 2019-01-07 DIAGNOSIS — Z85828 Personal history of other malignant neoplasm of skin: Secondary | ICD-10-CM | POA: Insufficient documentation

## 2019-01-07 DIAGNOSIS — R55 Syncope and collapse: Secondary | ICD-10-CM

## 2019-01-07 DIAGNOSIS — J449 Chronic obstructive pulmonary disease, unspecified: Secondary | ICD-10-CM | POA: Diagnosis not present

## 2019-01-07 DIAGNOSIS — I129 Hypertensive chronic kidney disease with stage 1 through stage 4 chronic kidney disease, or unspecified chronic kidney disease: Secondary | ICD-10-CM | POA: Diagnosis not present

## 2019-01-07 DIAGNOSIS — Z87891 Personal history of nicotine dependence: Secondary | ICD-10-CM | POA: Diagnosis not present

## 2019-01-07 DIAGNOSIS — N183 Chronic kidney disease, stage 3 (moderate): Secondary | ICD-10-CM | POA: Diagnosis not present

## 2019-01-07 DIAGNOSIS — Z7982 Long term (current) use of aspirin: Secondary | ICD-10-CM | POA: Insufficient documentation

## 2019-01-07 DIAGNOSIS — Z79899 Other long term (current) drug therapy: Secondary | ICD-10-CM | POA: Diagnosis not present

## 2019-01-07 DIAGNOSIS — Z91040 Latex allergy status: Secondary | ICD-10-CM | POA: Diagnosis not present

## 2019-01-07 LAB — CBC WITH DIFFERENTIAL/PLATELET
Abs Immature Granulocytes: 0.06 10*3/uL (ref 0.00–0.07)
Basophils Absolute: 0.1 10*3/uL (ref 0.0–0.1)
Basophils Relative: 0 %
Eosinophils Absolute: 0.1 10*3/uL (ref 0.0–0.5)
Eosinophils Relative: 1 %
HCT: 35 % — ABNORMAL LOW (ref 36.0–46.0)
HEMOGLOBIN: 12.1 g/dL (ref 12.0–15.0)
Immature Granulocytes: 1 %
Lymphocytes Relative: 12 %
Lymphs Abs: 1.4 10*3/uL (ref 0.7–4.0)
MCH: 30.9 pg (ref 26.0–34.0)
MCHC: 34.6 g/dL (ref 30.0–36.0)
MCV: 89.3 fL (ref 80.0–100.0)
Monocytes Absolute: 0.6 10*3/uL (ref 0.1–1.0)
Monocytes Relative: 5 %
NEUTROS ABS: 9.6 10*3/uL — AB (ref 1.7–7.7)
NRBC: 0 % (ref 0.0–0.2)
Neutrophils Relative %: 81 %
Platelets: 353 10*3/uL (ref 150–400)
RBC: 3.92 MIL/uL (ref 3.87–5.11)
RDW: 12.9 % (ref 11.5–15.5)
WBC: 11.8 10*3/uL — ABNORMAL HIGH (ref 4.0–10.5)

## 2019-01-07 LAB — COMPREHENSIVE METABOLIC PANEL
ALBUMIN: 3.4 g/dL — AB (ref 3.5–5.0)
ALT: 19 U/L (ref 0–44)
AST: 22 U/L (ref 15–41)
Alkaline Phosphatase: 68 U/L (ref 38–126)
Anion gap: 9 (ref 5–15)
BUN: 14 mg/dL (ref 8–23)
CHLORIDE: 105 mmol/L (ref 98–111)
CO2: 19 mmol/L — ABNORMAL LOW (ref 22–32)
CREATININE: 0.87 mg/dL (ref 0.44–1.00)
Calcium: 8.3 mg/dL — ABNORMAL LOW (ref 8.9–10.3)
GFR calc Af Amer: >60 mL/min (ref >60)
GFR calc non Af Amer: >60 mL/min (ref >60)
GLUCOSE: 131 mg/dL — AB (ref 70–99)
POTASSIUM: 2.9 mmol/L — AB (ref 3.5–5.1)
Sodium: 133 mmol/L — ABNORMAL LOW (ref 135–145)
Total Bilirubin: 1.1 mg/dL (ref 0.3–1.2)
Total Protein: 6.1 g/dL — ABNORMAL LOW (ref 6.5–8.1)

## 2019-01-07 LAB — TROPONIN I: Troponin I: <0.03 ng/mL (ref <0.03)

## 2019-01-07 MED ORDER — POTASSIUM CHLORIDE ER 10 MEQ PO TBCR: 10.0000 meq | EXTENDED_RELEASE_TABLET | Freq: Every day | ORAL | 0 refills | Status: AC

## 2019-01-07 MED ORDER — ONDANSETRON 4 MG PO TBDP: 4.0000 mg | ORAL_TABLET | Freq: Three times a day (TID) | ORAL | 0 refills | Status: AC | PRN

## 2019-01-07 MED ORDER — POTASSIUM CHLORIDE 20 MEQ/15ML (10%) PO SOLN
40.0000 meq | Freq: Once | ORAL | Status: AC
  Administered 2019-01-07: 40 meq via ORAL
  Filled 2019-01-07: qty 30

## 2019-01-07 MED ORDER — PROCHLORPERAZINE EDISYLATE 10 MG/2ML IJ SOLN
10.0000 mg | Freq: Once | INTRAMUSCULAR | Status: AC
  Administered 2019-01-07: 10 mg via INTRAVENOUS
  Filled 2019-01-07: qty 2

## 2019-01-07 NOTE — ED Provider Notes (Signed)
[facility] Emergency Department Provider Note   ____________________________________________   First MD Initiated Contact with Patient 01/07/19 1259     (approximate)  I have reviewed the triage vital signs and the nursing notes.   HISTORY  Chief Complaint Migraine and Loss of Consciousness    HPI Jessica Bowman is a 78 y.o. female who was picking up something very heavy at [facility] when she passed out.  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  She does not usually have this much nausea and vomiting with her headaches.  She has not had a regular migraine with pain for several years.  She says she has some headache and neck pain as well.  Patient has passed out several times in the past.   Past Medical History:  Diagnosis Date  . Anxiety   . Aortic atherosclerosis ([place])   . Cancer ([place])    skin ca on nose  . Chronic kidney disease    stage 3  . COPD (chronic obstructive pulmonary disease) ([place])    exacerbation 04/16/2017  . Diverticulosis   . Dyspnea    with exertion  . Fibrocystic breast disease   . GERD (gastroesophageal reflux disease)   . Glaucoma   . Hypercholesterolemia   . Hypertension   . IBS (irritable bowel syndrome)   . Low back pain   . Migraines    migraine with aura  . Osteoporosis   . Psoriasis   . Sensory ataxia   . Skin cancer of nose   . 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 Vitamin D deficiency     Patient Active Problem List   Diagnosis Date Noted  . Syncope 04/20/2016  . Anxiety 01/09/2016  . DD (diverticular disease) 01/09/2016  . Bloodgood disease 01/09/2016  . Glaucoma 01/09/2016  . Hypercholesterolemia 01/09/2016  . 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 Menopausal osteoporosis 01/09/2016  . LBP (low back pain) 10/19/2014  . 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 Chronic obstructive pulmonary disease ([place]) 09/08/2014    . Esophagitis, reflux 09/08/2014  . Avitaminosis D 09/08/2014  . BP (high blood pressure) 06/06/2014  . Cephalalgia 03/30/2014  . Hand numbness 03/30/2014    Past Surgical History:  Procedure Laterality Date  . APPENDECTOMY    . APPENDECTOMY    . BREAST BIOPSY Left    neg  . BREAST BIOPSY Right    neg  . CATARACT EXTRACTION W/PHACO Right 05/14/2017   Procedure: CATARACT EXTRACTION PHACO AND INTRAOCULAR LENS PLACEMENT ([place])  Right;  Surgeon: Leandrew Koyanagi, MD;  Location: [facility];  Service: Ophthalmology;  Laterality: Right;  . CEREBRAL ANEURYSM REPAIR     aneurysm repair  . COLONOSCOPY WITH PROPOFOL N/A 12/11/2018   Procedure: COLONOSCOPY WITH PROPOFOL;  Surgeon: Lollie Sails, MD;  Location[facility] ENDOSCOPY;  Service: Endoscopy;  Laterality: N/A;  . ESOPHAGOGASTRODUODENOSCOPY (EGD) WITH PROPOFOL N/A 12/11/2018   Procedure: ESOPHAGOGASTRODUODENOSCOPY (EGD) WITH PROPOFOL;  Surgeon: Lollie Sails, MD;  Location: [facility] ENDOSCOPY;  Service: Endoscopy;  Laterality: N/A;  . EYE SURGERY    . MOHS SURGERY  08/2003  . TONSILLECTOMY      Prior to Admission medications   Medication Sig Start Date End Date Taking? Authorizing Provider  albuterol (PROAIR HFA) 108 (90 Base) MCG/ACT inhaler Inhale 2 puffs into the lungs See admin instructions. Inhale 2 puffs every 4 to 6 hours as  needed for shortness of breath/wheezing. 09/15/15   [provider]  aspirin EC 81 MG tablet Take 81 mg by mouth at bedtime.     [provider]  betamethasone dipropionate (DIPROLENE) 0.05 % cream Apply topically 2 (two) times daily.    [provider]  butalbital-acetaminophen-caffeine (FIORICET, ESGIC) 50-325-40 MG tablet Take 1 tablet by mouth every 4 (four) hours as needed for headache.    [provider]  cefUROXime (CEFTIN) 500 MG tablet Take 500 mg by mouth 2 (two) times daily with a meal.    [provider]  Cholecalciferol (VITAMIN D3) 1000  units CAPS Take 1,000 Units by mouth daily.     [provider]  cyclobenzaprine (FLEXERIL) 5 MG tablet Take 5 mg by mouth daily as needed for muscle spasms.  03/14/15   [provider]  docusate sodium (COLACE) 100 MG capsule Take 100 mg by mouth daily.    [provider]  escitalopram (LEXAPRO) 10 MG tablet Take 5-10 mg by mouth See admin instructions. Take 1/2 tablet  (5 mg) by mouth every morning, and take 1 tablet by mouth every night at bedtime. 01/02/16   [provider]  fluticasone furoate-vilanterol (BREO ELLIPTA) 100-25 MCG/INH AEPB Inhale 1 puff into the lungs daily.  06/07/15   [provider]  hydrOXYzine (ATARAX/VISTARIL) 10 MG tablet Take 10 mg by mouth at bedtime as needed for itching.    [provider]  ipratropium-albuterol (DUONEB) 0.5-2.5 (3) MG/3ML SOLN Take 3 mLs by nebulization 4 (four) times daily. For shortness of breath/wheezing. 11/14/15   [provider]  irbesartan-hydrochlorothiazide (AVALIDE) 150-12.5 MG tablet Take 0.5 tablets by mouth daily.  03/14/15   [provider]  ketoconazole (NIZORAL) 2 % cream Apply 1 application topically daily.    [provider]  loratadine (CLARITIN) 10 MG tablet Take 10 mg by mouth daily as needed for allergies.    [provider]  LUMIGAN 0.01 % SOLN Place 1 drop into both eyes at bedtime. 03/30/16   [provider]  Melatonin 5 MG TABS Take 5 mg by mouth at bedtime as needed. For sleep.    [provider]  Multiple Vitamin (MULTIVITAMIN) tablet Take 1 tablet by mouth 2 (two) times a week.    [provider]  Multiple Vitamins-Minerals [facility] ADVANCED) TABS Take 1 tablet by mouth daily.    [provider]  omeprazole (PRILOSEC) 20 MG capsule Take 20 mg by mouth daily.    [provider]  ondansetron (ZOFRAN-ODT) 4 MG disintegrating tablet Take 4 mg by mouth once.    [provider]  predniSONE  (STERAPRED UNI-PAK 21 TAB) 10 MG (21) TBPK tablet Take 40 mg by mouth daily.    [provider]  Probiotic Product (Odin) Take 1 capsule by mouth daily.     [provider]  triamcinolone cream (KENALOG) 0.1 % Apply 1 application topically 2 (two) times daily.    [provider]    Allergies Alendronate; Latex; Lisinopril; Penicillin g; Risedronate; Epinephrine; and Penicillins  Family History  Problem Relation Age of Onset  . CAD Father   . Migraines Father   . CAD Sister   . COPD Mother   . Colon polyps Mother   . Breast cancer Neg Hx     Social History Social History   Tobacco Use  . Smoking status: Former Smoker    Types: Cigarettes    Last attempt to quit: 04/20/2012  Years since quitting: 6.7  . Smokeless tobacco: Never Used  Substance Use Topics  . Alcohol use: No  . Drug use: No    Review of Systems  Constitutional: No fever/chills Eyes: No visual changes. ENT: No sore throat. Cardiovascular: Denies chest pain. Respiratory: Denies shortness of breath. Gastrointestinal: No abdominal pain.   nausea,  vomiting.  No diarrhea.  No constipation. Genitourinary: Negative for dysuria. Musculoskeletal: Negative for back pain. Skin: Negative for rash. Neurological: Negative for focal weakness or numbness.   ____________________________________________   PHYSICAL EXAM:  VITAL SIGNS: ED Triage Vitals  Enc Vitals Group     BP 01/07/19 1250 (!) 181/75     Pulse Rate 01/07/19 1250 97     Resp 01/07/19 1255 19     Temp 01/07/19 1255 97.6 F (36.4 C)     Temp src --      SpO2 01/07/19 1250 99 %     Weight 01/07/19 1256 114 lb (51.7 kg)     Height 01/07/19 1256 5' (1.524 m)     Head Circumference --      Peak Flow --      Pain Score 01/07/19 1255 0     Pain Loc --      Pain Edu? --      Excl. in [place]? --     Constitutional: Alert and oriented.  Patient in distress with nausea and vomiting and moaning. Eyes:  Conjunctivae are normal. PER. EOMI. Head: Atraumatic. Nose: No congestion/rhinnorhea. Mouth/Throat: Mucous membranes are moist.  Oropharynx non-erythematous. Neck: No stridor.   Cardiovascular: Normal rate, regular rhythm. Grossly normal heart sounds.  Good peripheral circulation. Respiratory: Normal respiratory effort.  No retractions. Lungs CTAB. Gastrointestinal: Soft and nontender. No distention. No abdominal bruits. No CVA tenderness.   usculoskeletal: No lower extremity tenderness nor edema.   Neurologic:  Normal speech and language. No gross focal neurologic deficits are appreciated Skin:  Skin is warm, dry and intact. No rash noted.   ____________________________________________   LABS (all labs ordered are listed, but only abnormal results are displayed)  Labs Reviewed  COMPREHENSIVE METABOLIC PANEL - Abnormal; Notable for the following components:      Result Value   Sodium 133 (*)    Potassium 2.9 (*)    CO2 19 (*)    Glucose, Bld 131 (*)    Calcium 8.3 (*)    Total Protein 6.1 (*)    Albumin 3.4 (*)    All other components within normal limits  CBC WITH DIFFERENTIAL/PLATELET - Abnormal; Notable for the following components:   WBC 11.8 (*)    HCT 35.0 (*)    Neutro Abs 9.6 (*)    All other components within normal limits  TROPONIN I  TROPONIN I   ____________________________________________  EKG  EKG read interpreted by me shows normal sinus rhythm rate of 86 normal axis nonspecific ST-T wave changes ____________________________________________  RADIOLOGY  ED MD interpretation: EKG read and interpreted by me shows normal sinus rhythm rate of 86 normal axis no acute ST-T wave changes  Official radiology report(s): Dg Chest 2 View  Result Date: 01/07/2019 CLINICAL DATA:  Syncope. Migraine. EXAM: CHEST - 2 VIEW COMPARISON:  Chest CT 04/16/2018 and radiographs 05/13/2012 FINDINGS: The cardiomediastinal silhouette is within normal limits. Aortic  atherosclerosis is noted. There is chronic interstitial coarsening, and there are emphysematous changes most notable in the apices. Mild scarring or fibrotic changes are present in the lung bases. No confluent airspace opacity, edema, pleural effusion,  pneumothorax is identified. No acute osseous abnormality is seen. IMPRESSION: COPD without evidence of acute cardiopulmonary process. Electronically Signed   By: Logan Bores M.D.   On: 01/07/2019 14:28   Ct Head Wo Contrast  Result Date: 01/07/2019 CLINICAL DATA:  78 year old female with history of syncopal episode and vomiting. EXAM: CT HEAD WITHOUT CONTRAST CT CERVICAL SPINE WITHOUT CONTRAST TECHNIQUE: Multidetector CT imaging of the head and cervical spine was performed following the standard protocol without intravenous contrast. Multiplanar CT image reconstructions of the cervical spine were also generated. COMPARISON:  Head CT 04/20/2016. FINDINGS: CT HEAD FINDINGS Brain: Mild cerebral atrophy. Patchy and confluent areas of decreased attenuation are noted throughout the deep and periventricular white matter of the cerebral hemispheres bilaterally, compatible with chronic microvascular ischemic disease. No evidence of acute infarction, hemorrhage, hydrocephalus, extra-axial collection or mass lesion/mass effect. Vascular: Right MCA aneurysm clip again noted. No hyperdense vessel or unexpected calcification. Skull: Normal. Negative for fracture or focal lesion. Sinuses/Orbits: No acute finding. Other: Postoperative changes of right-sided pterional craniotomy. CT CERVICAL SPINE FINDINGS Alignment: Reversal of normal cervical lordosis centered at the level of C4-C5, likely chronic and degenerative. Alignment is otherwise anatomic. Skull base and vertebrae: No acute fracture. No primary bone lesion or focal pathologic process. Soft tissues and spinal canal: No prevertebral fluid or swelling. No visible canal hematoma. Disc levels: Severe multilevel degenerative  disc disease, most pronounced at C5-C6 and C6-C7. Severe multilevel facet arthropathy. Upper chest: Severe emphysematous changes in the visualize lung apices. Other: None. IMPRESSION: 1. No evidence of significant acute traumatic injury to the skull, brain or cervical spine. 2. Mild cerebral atrophy with chronic microvascular ischemic changes in the cerebral white matter, as above. 3. 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 4. 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 Electronically Signed   By: Vinnie Langton M.D.   On: 01/07/2019 14:25   Ct Cervical Spine Wo Contrast  Result Date: 01/07/2019 CLINICAL DATA:  78 year old female with history of syncopal episode and vomiting. EXAM: CT HEAD WITHOUT CONTRAST CT CERVICAL SPINE WITHOUT CONTRAST TECHNIQUE: Multidetector CT imaging of the head and cervical spine was performed following the standard protocol without intravenous contrast. Multiplanar CT image reconstructions of the cervical spine were also generated. COMPARISON:  Head CT 04/20/2016. FINDINGS: CT HEAD FINDINGS Brain: Mild cerebral atrophy. Patchy and confluent areas of decreased attenuation are noted throughout the deep and periventricular white matter of the cerebral hemispheres bilaterally, compatible with chronic microvascular ischemic disease. No evidence of acute infarction, hemorrhage, hydrocephalus, extra-axial collection or mass lesion/mass effect. Vascular: Right MCA aneurysm clip again noted. No hyperdense vessel or unexpected calcification. Skull: Normal. Negative for fracture or focal lesion. Sinuses/Orbits: No acute finding. Other: Postoperative changes of right-sided pterional craniotomy. CT CERVICAL SPINE FINDINGS Alignment: Reversal of normal cervical lordosis centered at the level of C4-C5, likely chronic and degenerative. Alignment is otherwise anatomic. Skull base and vertebrae: No acute fracture. No primary bone  lesion or focal pathologic process. Soft tissues and spinal canal: No prevertebral fluid or swelling. No visible canal hematoma. Disc levels: Severe multilevel degenerative disc disease, most pronounced at C5-C6 and C6-C7. Severe multilevel facet arthropathy. Upper chest: Severe emphysematous changes in the visualize lung apices. Other: None. IMPRESSION: 1. No evidence of significant acute traumatic injury to the skull, brain or cervical spine. 2. Mild cerebral atrophy with chronic microvascular ischemic changes in the cerebral white matter, as above. 3. Severe multilevel degenerative disc disease and cervical  spondylosis, as above. 4. 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 Electronically Signed   By: Vinnie Langton M.D.   On: 01/07/2019 14:25    ____________________________________________   PROCEDURES  Procedure(s) performed:   Procedures  Critical Care performed:   ____________________________________________   INITIAL IMPRESSION / ASSESSMENT AND PLAN / ED COURSE  Patient passed out with straining.  This may be the whole problem.  Troponins have been negative patient feels much better just kind of achy now.  She never had the worst headache of her life.  She never had a ruptured aneurysm and we found the other aneurysm she had clipped they did not see more than 1.  She has no focal neurological findings we will let her go home follow-up with her doctor she will come back if she has any further problems.          ____________________________________________   FINAL CLINICAL IMPRESSION(S) / ED DIAGNOSES  Final diagnoses:  Syncope and collapse     ED Discharge Orders    None       Note:  This document was prepared using Dragon voice recognition software and may include unintentional dictation errors.    Nena Polio, MD 01/07/19 [phone]

## 2019-01-07 NOTE — Discharge Instructions (Signed)
Please return at once for any further feelings of wooziness or headache vomiting chest pain etc. please follow-up with your regular doctor tomorrow for recheck.  If you have any difficulty getting in because of the weather called the ambulance.

## 2019-01-07 NOTE — ED Triage Notes (Signed)
Had a syncopal episode at the store. 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 Vomited numerous times and given zofran with a liter of fluids.

## 2019-01-13 ENCOUNTER — Other Ambulatory Visit
Admission: RE | Admit: 2019-01-13 | Discharge: 2019-01-13 | Disposition: A | Discharge status: Home / Self Care | Payer: Medicare Other | Attending: Gastroenterology | Admitting: Gastroenterology

## 2019-01-13 DIAGNOSIS — R42 Dizziness and giddiness: Secondary | ICD-10-CM | POA: Diagnosis present

## 2019-01-13 LAB — BASIC METABOLIC PANEL
Anion gap: 8 (ref 5–15)
BUN: 17 mg/dL (ref 8–23)
CALCIUM: 9.1 mg/dL (ref 8.9–10.3)
CO2: 26 mmol/L (ref 22–32)
Chloride: 101 mmol/L (ref 98–111)
Creatinine, Ser: 0.9 mg/dL (ref 0.44–1.00)
GFR calc Af Amer: >60 mL/min (ref >60)
GFR calc non Af Amer: >60 mL/min (ref >60)
Glucose, Bld: 95 mg/dL (ref 70–99)
Potassium: 3.5 mmol/L (ref 3.5–5.1)
Sodium: 135 mmol/L (ref 135–145)

## 2019-01-13 LAB — MAGNESIUM: Magnesium: 2.1 mg/dL (ref 1.7–2.4)

## 2019-03-31 ENCOUNTER — Other Ambulatory Visit: Payer: Self-pay | Admitting: Internal Medicine

## 2019-03-31 DIAGNOSIS — Z1231 Encounter for screening mammogram for malignant neoplasm of breast: Secondary | ICD-10-CM

## 2019-08-18 ENCOUNTER — Other Ambulatory Visit: Payer: Self-pay

## 2019-08-18 ENCOUNTER — Encounter (INDEPENDENT_AMBULATORY_CARE_PROVIDER_SITE_OTHER): Payer: Self-pay

## 2019-08-18 ENCOUNTER — Ambulatory Visit
Admission: RE | Admit: 2019-08-18 | Discharge: 2019-08-18 | Disposition: A | Discharge status: Home / Self Care | Payer: Medicare Other | Source: Ambulatory Visit | Attending: Internal Medicine | Admitting: Internal Medicine

## 2019-08-18 DIAGNOSIS — Z1231 Encounter for screening mammogram for malignant neoplasm of breast: Secondary | ICD-10-CM | POA: Insufficient documentation

## 2019-09-15 ENCOUNTER — Other Ambulatory Visit: Payer: Self-pay

## 2019-09-15 ENCOUNTER — Other Ambulatory Visit: Payer: Self-pay | Admitting: Gastroenterology

## 2019-09-15 ENCOUNTER — Ambulatory Visit
Admission: RE | Admit: 2019-09-15 | Discharge: 2019-09-15 | Disposition: A | Discharge status: Home / Self Care | Payer: Medicare Other | Source: Ambulatory Visit | Attending: Gastroenterology | Admitting: Gastroenterology

## 2019-09-15 DIAGNOSIS — K559 Vascular disorder of intestine, unspecified: Secondary | ICD-10-CM | POA: Insufficient documentation

## 2019-09-15 DIAGNOSIS — K625 Hemorrhage of anus and rectum: Secondary | ICD-10-CM | POA: Diagnosis present

## 2019-09-15 DIAGNOSIS — R1032 Left lower quadrant pain: Secondary | ICD-10-CM | POA: Diagnosis present

## 2019-09-15 LAB — POCT I-STAT CREATININE: Creatinine, Ser: 0.9 mg/dL (ref 0.44–1.00)

## 2019-09-15 MED ORDER — IOHEXOL 350 MG/ML SOLN
80.0000 mL | Freq: Once | INTRAVENOUS | Status: AC | PRN
  Administered 2019-09-15: 80 mL via INTRAVENOUS

## 2019-10-05 ENCOUNTER — Encounter (INDEPENDENT_AMBULATORY_CARE_PROVIDER_SITE_OTHER): Payer: Medicare Other | Admitting: Vascular Surgery

## 2019-10-12 ENCOUNTER — Encounter (INDEPENDENT_AMBULATORY_CARE_PROVIDER_SITE_OTHER): Payer: Medicare Other | Admitting: Vascular Surgery

## 2019-10-19 ENCOUNTER — Other Ambulatory Visit: Payer: Self-pay

## 2019-10-19 ENCOUNTER — Encounter (INDEPENDENT_AMBULATORY_CARE_PROVIDER_SITE_OTHER): Payer: Self-pay

## 2019-10-19 ENCOUNTER — Encounter (INDEPENDENT_AMBULATORY_CARE_PROVIDER_SITE_OTHER): Payer: Self-pay | Admitting: Vascular Surgery

## 2019-10-19 ENCOUNTER — Ambulatory Visit (INDEPENDENT_AMBULATORY_CARE_PROVIDER_SITE_OTHER): Payer: Medicare Other | Admitting: Vascular Surgery

## 2019-10-19 VITALS — BP 156/83 | HR 121 | Resp 20 | Ht 61.0 in | Wt 117.0 lb

## 2019-10-19 DIAGNOSIS — E7800 Pure hypercholesterolemia, unspecified: Secondary | ICD-10-CM | POA: Diagnosis not present

## 2019-10-19 DIAGNOSIS — I700 Atherosclerosis of aorta: Secondary | ICD-10-CM

## 2019-10-19 DIAGNOSIS — I6523 Occlusion and stenosis of bilateral carotid arteries: Secondary | ICD-10-CM

## 2019-10-19 DIAGNOSIS — I6529 Occlusion and stenosis of unspecified carotid artery: Secondary | ICD-10-CM | POA: Insufficient documentation

## 2019-10-19 DIAGNOSIS — I10 Essential (primary) hypertension: Secondary | ICD-10-CM | POA: Diagnosis not present

## 2019-10-19 NOTE — Assessment & Plan Note (Addendum)
lipid control important in reducing the progression of atherosclerotic disease. Continue statin therapy as prescribed recently.

## 2019-10-19 NOTE — Progress Notes (Signed)
Patient ID: Jessica Bowman, female   DOB: Sep 20, 1941, 78 y.o.   MRN: [id number]  Chief Complaint  Patient presents with  . New Patient (Initial Visit)    HPI KRISTIN DAISEY is a 78 y.o. female.  I am asked to see the patient by C. London for evaluation of significant atherosclerotic plaque in the aorta.  The patient was having diarrhea symptoms due to her irritable bowel and also had some blood per rectum so a CT scan of the abdomen and pelvis was performed which I have independently reviewed.  She does have significant aortic atherosclerotic plaque with no significant narrowing of the visceral or renal vessels on the CT scan.  Her distal aorta and iliac arteries had only mild disease.  The bulk of the plaque was in the perivisceral and proximal infrarenal aorta.  She denies any significant claudication symptoms.  She denies any signs of peripheral embolization.  She was recently started on a statin agent but just started this weekend.  She had previously been on aspirin but had not taken this in several months. Patient also reports she has previously been checked for carotid disease and was told it was less than 50% a few years ago.  She does not think she has had it checked since that time.   Past Medical History:  Diagnosis Date  . Anxiety   . Aortic atherosclerosis ([place])   . Cancer ([place])    skin ca on nose  . Chronic kidney disease    stage 3  . COPD (chronic obstructive pulmonary disease) ([place])    exacerbation 04/16/2017  . Diverticulosis   . Dyspnea    with exertion  . Fibrocystic breast disease   . GERD (gastroesophageal reflux disease)   . Glaucoma   . Hypercholesterolemia   . Hypertension   . IBS (irritable bowel syndrome)   . Low back pain   . Migraines    migraine with aura  . Osteoporosis   . Psoriasis   . Sensory ataxia   . Skin cancer of nose   . 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 Vitamin D deficiency     Past Surgical History:  Procedure Laterality  Date  . APPENDECTOMY    . APPENDECTOMY    . BREAST BIOPSY Left    neg  . BREAST BIOPSY Right    neg  . CATARACT EXTRACTION W/PHACO Right 05/14/2017   Procedure: CATARACT EXTRACTION PHACO AND INTRAOCULAR LENS PLACEMENT ([place])  Right;  Surgeon: Leandrew Koyanagi, MD;  Location: [facility];  Service: Ophthalmology;  Laterality: Right;  . CEREBRAL ANEURYSM REPAIR     aneurysm repair  . COLONOSCOPY WITH PROPOFOL N/A 12/11/2018   Procedure: COLONOSCOPY WITH PROPOFOL;  Surgeon: Lollie Sails, MD;  Location: [facility] ENDOSCOPY;  Service: Endoscopy;  Laterality: N/A;  . ESOPHAGOGASTRODUODENOSCOPY (EGD) WITH PROPOFOL N/A 12/11/2018   Procedure: ESOPHAGOGASTRODUODENOSCOPY (EGD) WITH PROPOFOL;  Surgeon: Lollie Sails, MD;  Location: [facility] ENDOSCOPY;  Service: Endoscopy;  Laterality: N/A;  . EYE SURGERY    . MOHS SURGERY  08/2003  . TONSILLECTOMY       Family History  Problem Relation Age of Onset  . CAD Father   . Migraines Father   . CAD Sister   . COPD Mother   . Colon polyps Mother   . Breast cancer Neg Hx     Social History   Tobacco Use  . Smoking status: Former Smoker    Types:  Cigarettes    Quit date: 04/20/2012    Years since quitting: 7.5  . Smokeless tobacco: Never Used  Substance Use Topics  . Alcohol use: No  . Drug use: No     Allergies  Allergen Reactions  . Alendronate     Other reaction(s): Abdominal Pain GI upset  . Latex     Other reaction(s): Other (See Comments) blisters  . Lisinopril Swelling    Swollen palate Patient states it swelled up her soft palate really bad.   . Penicillin G Rash  . Risedronate     Other reaction(s): Abdominal Pain GI upset  . Epinephrine     Other reaction(s): Unknown  . Penicillins Itching    Palms of hands of soles of feet become splotchy and itch. Has patient had a PCN reaction causing immediate rash, facial/tongue/throat swelling, SOB or lightheadedness with hypotension: Yes Has patient had a PCN  reaction causing severe rash involving mucus membranes or skin necrosis: No Has patient had a PCN reaction that required hospitalization No Has patient had a PCN reaction occurring within the last 10 years: No If all of the above answers are "NO", then may proceed with Cephalosporin use.    Current Outpatient Medications  Medication Sig Dispense Refill  . acetaminophen (TYLENOL) 500 MG tablet Take by mouth.    Marland Kitchen albuterol (PROAIR HFA) 108 (90 Base) MCG/ACT inhaler Inhale 2 puffs into the lungs See admin instructions. Inhale 2 puffs every 4 to 6 hours as needed for shortness of breath/wheezing.    . betamethasone dipropionate (DIPROLENE) 0.05 % cream Apply topically 2 (two) times daily.    . butalbital-acetaminophen-caffeine (FIORICET, ESGIC) 50-325-40 MG tablet Take 1 tablet by mouth every 4 (four) hours as needed for headache.    . calcium elemental as carbonate (BARIATRIC TUMS ULTRA) 400 MG chewable tablet Chew by mouth.    . Cholecalciferol (VITAMIN D3) 1000 units CAPS Take 1,000 Units by mouth daily.     . cyclobenzaprine (FLEXERIL) 5 MG tablet Take 5 mg by mouth daily as needed for muscle spasms.     Marland Kitchen docusate sodium (COLACE) 100 MG capsule Take 100 mg by mouth daily.    . dorzolamide (TRUSOPT) 2 % ophthalmic solution Apply to eye.    . escitalopram (LEXAPRO) 10 MG tablet Take 5-10 mg by mouth See admin instructions. Take 1/2 tablet  (5 mg) by mouth every morning, and take 1 tablet by mouth every night at bedtime.    Marland Kitchen escitalopram (LEXAPRO) 10 MG tablet TAKE ONE-HALF TABLET BY MOUTH IN THE MORNING AND ONE TABLET IN THE EVENING    . fluticasone furoate-vilanterol (BREO ELLIPTA) 100-25 MCG/INH AEPB Inhale 1 puff into the lungs daily.     . hydrocortisone cream 1 % Apply topically.    . hydrOXYzine (ATARAX/VISTARIL) 10 MG tablet Take 10 mg by mouth at bedtime as needed for itching.    Marland Kitchen ipratropium-albuterol (DUONEB) 0.5-2.5 (3) MG/3ML SOLN Take 3 mLs by nebulization 4 (four) times daily.  For shortness of breath/wheezing.    . irbesartan-hydrochlorothiazide (AVALIDE) 150-12.5 MG tablet Take 0.5 tablets by mouth daily.     Marland Kitchen ketoconazole (NIZORAL) 2 % cream Apply 1 application topically daily.    Marland Kitchen loratadine (CLARITIN) 10 MG tablet Take 10 mg by mouth daily as needed for allergies.    Marland Kitchen LUMIGAN 0.01 % SOLN Place 1 drop into both eyes at bedtime.    . Multiple Vitamin (MULTIVITAMIN) tablet Take 1 tablet by mouth 2 (two) times a  week.    . Multiple Vitamins-Minerals (STRESSTABS ADVANCED) TABS Take 1 tablet by mouth daily.    . ondansetron (ZOFRAN ODT) 4 MG disintegrating tablet Take 1 tablet (4 mg total) by mouth every 8 (eight) hours as needed for nausea or vomiting. 20 tablet 0  . pantoprazole (PROTONIX) 40 MG tablet Take by mouth.    . predniSONE (STERAPRED UNI-PAK 21 TAB) 10 MG (21) TBPK tablet Take 40 mg by mouth daily.    . Probiotic Product (PHILLIPS COLON HEALTH PO) Take 1 capsule by mouth daily.     . rosuvastatin (CRESTOR) 20 MG tablet Take by mouth.    . triamcinolone cream (KENALOG) 0.1 % Apply 1 application topically 2 (two) times daily.    Marland Kitchen aspirin EC 81 MG tablet Take 81 mg by mouth at bedtime.     Marland Kitchen aspirin EC 81 MG tablet Take by mouth.    . cefUROXime (CEFTIN) 500 MG tablet Take 500 mg by mouth 2 (two) times daily with a meal.    . dorzolamide (TRUSOPT) 2 % ophthalmic solution     . Melatonin 5 MG TABS Take 5 mg by mouth at bedtime as needed. For sleep.    Marland Kitchen omeprazole (PRILOSEC) 20 MG capsule Take 20 mg by mouth daily.    . ondansetron (ZOFRAN-ODT) 4 MG disintegrating tablet Take 4 mg by mouth once.    . potassium chloride (K-DUR) 10 MEQ tablet Take 1 tablet (10 mEq total) by mouth daily. (Patient not taking: Reported on 10/19/2019) 5 tablet 0   No current facility-administered medications for this visit.       REVIEW OF SYSTEMS (Negative unless checked)  Constitutional: [] Weight loss  [] Fever  [] Chills Cardiac: [] Chest pain   [] Chest pressure    [] Palpitations   [] Shortness of breath when laying flat   [] Shortness of breath at rest   [] Shortness of breath with exertion. Vascular:  [] Pain in legs with walking   [] Pain in legs at rest   [] Pain in legs when laying flat   [] Claudication   [] Pain in feet when walking  [] Pain in feet at rest  [] Pain in feet when laying flat   [] History of DVT   [] Phlebitis   [] Swelling in legs   [] Varicose veins   [] Non-healing ulcers Pulmonary:   [] Uses home oxygen   [] Productive cough   [] Hemoptysis   [] Wheeze  [x] COPD   [] Asthma Neurologic:  [] Dizziness  [] Blackouts   [] Seizures   [] History of stroke   [] History of TIA  [] Aphasia   [] Temporary blindness   [] Dysphagia   [] Weakness or numbness in arms   [] Weakness or numbness in legs Musculoskeletal:  [x] Arthritis   [] Joint swelling   [x] Joint pain   [] Low back pain Hematologic:  [] Easy bruising  [] Easy bleeding   [] Hypercoagulable state   [] Anemic  [] Hepatitis Gastrointestinal:  [] Blood in stool   [] Vomiting blood  [x] Gastroesophageal reflux/heartburn   [] Abdominal pain Genitourinary:  [x] Chronic kidney disease   [] Difficult urination  [] Frequent urination  [] Burning with urination   [] Hematuria Skin:  [] Rashes   [] Ulcers   [] Wounds Psychological:  [x] History of anxiety   []  History of major depression.    Physical Exam BP (!) 156/83 (BP Location: Right Arm)   Pulse (!) 121   Resp 20   Ht 5\' 1"  (1.549 m)   Wt 117 lb (53.1 kg)   BMI 22.11 kg/m  Gen:  WD/WN, NAD.  Appears younger than stated age Head: [place]/AT, No temporalis wasting. Ear/Nose/Throat: Hearing grossly  intact, nares w/o erythema or drainage, oropharynx w/o Erythema/Exudate Eyes: Conjunctiva clear, sclera non-icteric  Neck: trachea midline.  No JVD.  Pulmonary:  Good air movement, respirations not labored, no use of accessory muscles  Cardiac: RRR, no JVD Vascular:  Vessel Right Left  Radial Palpable Palpable                          DP  2+  2+  PT  1+  2+   Gastrointestinal:. No  masses, tenderness, or increased aortic impulse Musculoskeletal: M/S 5/5 throughout.  Extremities without ischemic changes.  No deformity or atrophy.  No significant lower extremity edema. Neurologic: Sensation grossly intact in extremities.  Symmetrical.  Speech is fluent. Motor exam as listed above. Psychiatric: Judgment intact, Mood & affect appropriate for pt's clinical situation. Dermatologic: No rashes or ulcers noted.  No cellulitis or open wounds.    Radiology No results found.  Labs Recent Results (from the past 2160 hour(s))  I-STAT creatinine     Status: None   Collection Time: 09/15/19  2:13 PM  Result Value Ref Range   Creatinine, Ser 0.90 0.44 - 1.00 mg/dL    Assessment/Plan:  BP (high blood pressure) blood pressure control important in reducing the progression of atherosclerotic disease. On appropriate oral medications.   Hypercholesterolemia lipid control important in reducing the progression of atherosclerotic disease. Continue statin therapy as prescribed recently.   Aortic atherosclerosis (HCC) CT scan of the abdomen and pelvis was performed which I have independently reviewed.  She does have significant aortic atherosclerotic plaque with no significant narrowing of the visceral or renal vessels on the CT scan.  Her distal aorta and iliac arteries had only mild disease.  The bulk of the plaque was in the perivisceral and proximal infrarenal aorta. At this point, no intervention is required as the patient is not symptomatic from the significant aortic plaque.  I believe she should continue on her Crestor or what ever statin she can tolerate.  I would also recommend she go back on her aspirin daily.  I will plan to check this with a duplex in 6 months and will be happy to see her sooner if any problems develop in the interim.  Carotid stenosis Had mild disease sometime in the past.  It has been a while since this is been checked.  We can do another carotid duplex at  her next visit.      Leotis Pain 10/19/2019, 2:52 PM   This note was created with Dragon medical transcription system.  Any errors from dictation are unintentional.

## 2019-10-19 NOTE — Assessment & Plan Note (Signed)
blood pressure control important in reducing the progression of atherosclerotic disease. On appropriate oral medications.  

## 2019-10-19 NOTE — Assessment & Plan Note (Signed)
CT scan of the abdomen and pelvis was performed which I have independently reviewed.  She does have significant aortic atherosclerotic plaque with no significant narrowing of the visceral or renal vessels on the CT scan.  Her distal aorta and iliac arteries had only mild disease.  The bulk of the plaque was in the perivisceral and proximal infrarenal aorta. At this point, no intervention is required as the patient is not symptomatic from the significant aortic plaque.  I believe she should continue on her Crestor or what ever statin she can tolerate.  I would also recommend she go back on her aspirin daily.  I will plan to check this with a duplex in 6 months and will be happy to see her sooner if any problems develop in the interim.

## 2019-10-19 NOTE — Assessment & Plan Note (Signed)
Had mild disease sometime in the past.  It has been a while since this is been checked.  We can do another carotid duplex at her next visit.

## 2020-02-04 ENCOUNTER — Other Ambulatory Visit: Payer: Self-pay

## 2020-02-04 ENCOUNTER — Emergency Department
Admission: EM | Admit: 2020-02-04 | Discharge: 2020-02-04 | Disposition: A | Discharge status: Home / Self Care | Payer: No Typology Code available for payment source | Attending: Student in an Organized Health Care Education/Training Program | Admitting: Student in an Organized Health Care Education/Training Program

## 2020-02-04 ENCOUNTER — Emergency Department: Payer: No Typology Code available for payment source

## 2020-02-04 DIAGNOSIS — R0789 Other chest pain: Secondary | ICD-10-CM | POA: Diagnosis not present

## 2020-02-04 DIAGNOSIS — J449 Chronic obstructive pulmonary disease, unspecified: Secondary | ICD-10-CM | POA: Diagnosis not present

## 2020-02-04 DIAGNOSIS — Z87891 Personal history of nicotine dependence: Secondary | ICD-10-CM | POA: Diagnosis not present

## 2020-02-04 DIAGNOSIS — Z7982 Long term (current) use of aspirin: Secondary | ICD-10-CM | POA: Diagnosis not present

## 2020-02-04 DIAGNOSIS — Z79899 Other long term (current) drug therapy: Secondary | ICD-10-CM | POA: Diagnosis not present

## 2020-02-04 DIAGNOSIS — N183 Chronic kidney disease, stage 3 unspecified: Secondary | ICD-10-CM | POA: Diagnosis not present

## 2020-02-04 DIAGNOSIS — Y93I9 Activity, other involving external motion: Secondary | ICD-10-CM | POA: Insufficient documentation

## 2020-02-04 DIAGNOSIS — Y92410 Unspecified street and highway as the place of occurrence of the external cause: Secondary | ICD-10-CM | POA: Diagnosis not present

## 2020-02-04 DIAGNOSIS — I129 Hypertensive chronic kidney disease with stage 1 through stage 4 chronic kidney disease, or unspecified chronic kidney disease: Secondary | ICD-10-CM | POA: Diagnosis not present

## 2020-02-04 DIAGNOSIS — Y999 Unspecified external cause status: Secondary | ICD-10-CM | POA: Diagnosis not present

## 2020-02-04 LAB — CBC
HCT: 36.6 % (ref 36.0–46.0)
Hemoglobin: 12.5 g/dL (ref 12.0–15.0)
MCH: 31.2 pg (ref 26.0–34.0)
MCHC: 34.2 g/dL (ref 30.0–36.0)
MCV: 91.3 fL (ref 80.0–100.0)
Platelets: 342 10*3/uL (ref 150–400)
RBC: 4.01 MIL/uL (ref 3.87–5.11)
RDW: 13.2 % (ref 11.5–15.5)
WBC: 9 10*3/uL (ref 4.0–10.5)
nRBC: 0 % (ref 0.0–0.2)

## 2020-02-04 LAB — BASIC METABOLIC PANEL
Anion gap: 10 (ref 5–15)
BUN: 19 mg/dL (ref 8–23)
CO2: 18 mmol/L — ABNORMAL LOW (ref 22–32)
Calcium: 9.3 mg/dL (ref 8.9–10.3)
Chloride: 109 mmol/L (ref 98–111)
Creatinine, Ser: 0.78 mg/dL (ref 0.44–1.00)
GFR calc Af Amer: >60 mL/min (ref >60)
GFR calc non Af Amer: >60 mL/min (ref >60)
Glucose, Bld: 105 mg/dL — ABNORMAL HIGH (ref 70–99)
Potassium: 3.4 mmol/L — ABNORMAL LOW (ref 3.5–5.1)
Sodium: 137 mmol/L (ref 135–145)

## 2020-02-04 MED ORDER — IOHEXOL 300 MG/ML  SOLN
100.0000 mL | Freq: Once | INTRAMUSCULAR | Status: AC | PRN
  Administered 2020-02-04: 100 mL via INTRAVENOUS
  Filled 2020-02-04: qty 100
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
## 2020-04-19 ENCOUNTER — Ambulatory Visit (INDEPENDENT_AMBULATORY_CARE_PROVIDER_SITE_OTHER): Payer: Medicare Other | Admitting: Nurse Practitioner

## 2020-04-19 ENCOUNTER — Encounter (INDEPENDENT_AMBULATORY_CARE_PROVIDER_SITE_OTHER): Payer: Medicare Other

## 2021-08-27 ENCOUNTER — Other Ambulatory Visit: Payer: Self-pay | Admitting: Nurse Practitioner

## 2021-08-27 DIAGNOSIS — Z1231 Encounter for screening mammogram for malignant neoplasm of breast: Secondary | ICD-10-CM

## 2021-09-27 ENCOUNTER — Ambulatory Visit: Payer: Medicare Other

## 2021-10-31 ENCOUNTER — Ambulatory Visit: Payer: Medicare Other

## 2024-09-28 ENCOUNTER — Other Ambulatory Visit: Payer: Self-pay | Admitting: Nurse Practitioner

## 2024-09-28 DIAGNOSIS — Z1231 Encounter for screening mammogram for malignant neoplasm of breast: Secondary | ICD-10-CM
